# Patient Record
Sex: Female | Born: 1950
Health system: Southern US, Community
[De-identification: ages and names within clinical notes are randomized; demographics above are authoritative.]

## PROBLEM LIST (undated history)

## (undated) DIAGNOSIS — J189 Pneumonia, unspecified organism: Secondary | ICD-10-CM

## (undated) DIAGNOSIS — H409 Unspecified glaucoma: Secondary | ICD-10-CM

## (undated) DIAGNOSIS — M1990 Unspecified osteoarthritis, unspecified site: Secondary | ICD-10-CM

## (undated) DIAGNOSIS — E079 Disorder of thyroid, unspecified: Secondary | ICD-10-CM

## (undated) DIAGNOSIS — I10 Essential (primary) hypertension: Secondary | ICD-10-CM

## (undated) DIAGNOSIS — G709 Myoneural disorder, unspecified: Secondary | ICD-10-CM

## (undated) DIAGNOSIS — E669 Obesity, unspecified: Secondary | ICD-10-CM

## (undated) DIAGNOSIS — I82409 Acute embolism and thrombosis of unspecified deep veins of unspecified lower extremity: Secondary | ICD-10-CM

## (undated) HISTORY — PX: ABDOMINAL HYSTERECTOMY: SHX81

## (undated) HISTORY — DX: Pneumonia, unspecified organism: J18.9

## (undated) HISTORY — DX: Disorder of thyroid, unspecified: E07.9

## (undated) HISTORY — PX: OTHER SURGICAL HISTORY: SHX169

## (undated) HISTORY — PX: EYE SURGERY: SHX253

---

## 2000-08-16 ENCOUNTER — Encounter: Admission: RE | Admit: 2000-08-16 | Discharge: 2000-08-16 | Payer: Self-pay | Admitting: *Deleted

## 2000-08-16 ENCOUNTER — Encounter: Payer: Self-pay | Admitting: *Deleted

## 2004-03-14 ENCOUNTER — Emergency Department (HOSPITAL_COMMUNITY): Admission: EM | Admit: 2004-03-14 | Discharge: 2004-03-14 | Payer: Self-pay | Admitting: Emergency Medicine

## 2004-04-11 ENCOUNTER — Ambulatory Visit (HOSPITAL_COMMUNITY): Admission: RE | Admit: 2004-04-11 | Discharge: 2004-04-11 | Payer: Self-pay | Admitting: Internal Medicine

## 2005-04-17 ENCOUNTER — Ambulatory Visit (HOSPITAL_COMMUNITY): Admission: RE | Admit: 2005-04-17 | Discharge: 2005-04-17 | Payer: Self-pay | Admitting: Internal Medicine

## 2006-05-14 ENCOUNTER — Ambulatory Visit (HOSPITAL_COMMUNITY): Admission: RE | Admit: 2006-05-14 | Discharge: 2006-05-14 | Payer: Self-pay | Admitting: Obstetrics & Gynecology

## 2006-07-22 ENCOUNTER — Ambulatory Visit: Payer: Self-pay | Admitting: Gastroenterology

## 2006-08-05 ENCOUNTER — Ambulatory Visit: Payer: Self-pay | Admitting: Gastroenterology

## 2007-09-25 ENCOUNTER — Ambulatory Visit (HOSPITAL_COMMUNITY): Admission: RE | Admit: 2007-09-25 | Discharge: 2007-09-25 | Payer: Self-pay | Admitting: Obstetrics & Gynecology

## 2008-09-28 ENCOUNTER — Ambulatory Visit (HOSPITAL_COMMUNITY): Admission: RE | Admit: 2008-09-28 | Discharge: 2008-09-28 | Payer: Self-pay | Admitting: Obstetrics & Gynecology

## 2009-10-06 ENCOUNTER — Ambulatory Visit (HOSPITAL_COMMUNITY): Admission: RE | Admit: 2009-10-06 | Discharge: 2009-10-06 | Payer: Self-pay | Admitting: Obstetrics & Gynecology

## 2010-11-07 ENCOUNTER — Ambulatory Visit (HOSPITAL_COMMUNITY)
Admission: RE | Admit: 2010-11-07 | Discharge: 2010-11-07 | Payer: Self-pay | Source: Home / Self Care | Attending: Obstetrics & Gynecology | Admitting: Obstetrics & Gynecology

## 2010-11-09 ENCOUNTER — Ambulatory Visit (HOSPITAL_COMMUNITY)
Admission: RE | Admit: 2010-11-09 | Discharge: 2010-11-09 | Payer: Self-pay | Source: Home / Self Care | Attending: Obstetrics & Gynecology | Admitting: Obstetrics & Gynecology

## 2011-10-26 ENCOUNTER — Other Ambulatory Visit: Payer: Self-pay | Admitting: Obstetrics & Gynecology

## 2011-10-26 DIAGNOSIS — Z1231 Encounter for screening mammogram for malignant neoplasm of breast: Secondary | ICD-10-CM

## 2011-11-29 ENCOUNTER — Ambulatory Visit (HOSPITAL_COMMUNITY): Payer: Self-pay

## 2011-11-29 ENCOUNTER — Ambulatory Visit (HOSPITAL_COMMUNITY)
Admission: RE | Admit: 2011-11-29 | Discharge: 2011-11-29 | Disposition: A | Discharge status: Home / Self Care | Payer: Self-pay | Source: Ambulatory Visit | Attending: Obstetrics & Gynecology | Admitting: Obstetrics & Gynecology

## 2011-11-29 DIAGNOSIS — Z1231 Encounter for screening mammogram for malignant neoplasm of breast: Secondary | ICD-10-CM

## 2012-11-13 ENCOUNTER — Other Ambulatory Visit: Payer: Self-pay | Admitting: Obstetrics & Gynecology

## 2012-11-13 DIAGNOSIS — Z1231 Encounter for screening mammogram for malignant neoplasm of breast: Secondary | ICD-10-CM

## 2012-12-09 ENCOUNTER — Ambulatory Visit (HOSPITAL_COMMUNITY)
Admission: RE | Admit: 2012-12-09 | Discharge: 2012-12-09 | Disposition: A | Discharge status: Home / Self Care | Payer: Self-pay | Source: Ambulatory Visit | Attending: Obstetrics & Gynecology | Admitting: Obstetrics & Gynecology

## 2012-12-09 DIAGNOSIS — Z1231 Encounter for screening mammogram for malignant neoplasm of breast: Secondary | ICD-10-CM

## 2013-07-10 ENCOUNTER — Emergency Department (INDEPENDENT_AMBULATORY_CARE_PROVIDER_SITE_OTHER): Payer: BC Managed Care – PPO

## 2013-07-10 ENCOUNTER — Emergency Department (HOSPITAL_COMMUNITY)
Admission: EM | Admit: 2013-07-10 | Discharge: 2013-07-10 | Disposition: A | Discharge status: Home / Self Care | Payer: BC Managed Care – PPO | Source: Home / Self Care

## 2013-07-10 ENCOUNTER — Encounter (HOSPITAL_COMMUNITY): Payer: Self-pay | Admitting: Emergency Medicine

## 2013-07-10 DIAGNOSIS — J189 Pneumonia, unspecified organism: Secondary | ICD-10-CM

## 2013-07-10 HISTORY — DX: Unspecified glaucoma: H40.9

## 2013-07-10 MED ORDER — LEVOFLOXACIN 500 MG PO TABS: 500.0000 mg | ORAL_TABLET | Freq: Every day | ORAL | Status: DC

## 2013-07-10 MED ORDER — ALBUTEROL SULFATE (5 MG/ML) 0.5% IN NEBU
5.0000 mg | INHALATION_SOLUTION | Freq: Once | RESPIRATORY_TRACT | Status: AC
  Administered 2013-07-10: 5 mg via RESPIRATORY_TRACT

## 2013-07-10 MED ORDER — ALBUTEROL SULFATE (5 MG/ML) 0.5% IN NEBU
INHALATION_SOLUTION | RESPIRATORY_TRACT | Status: AC
  Filled 2013-07-10: qty 1

## 2013-07-10 NOTE — ED Notes (Signed)
Returned to room from xray

## 2013-07-10 NOTE — ED Notes (Signed)
Reports head and chest congestion, reports feeling like head in a vice.  Denies ear pain and denies sore throat.  Unknown if patient has fever

## 2013-07-10 NOTE — ED Provider Notes (Signed)
Ariel Henry is a 62 y.o. female who presents to Urgent Care today for congestion and wheezing for the last 4 days. Associated with a mildly productive cough. Patient has tried NyQuil which is only helped a bit. She denies any fevers or chills chest pains or palpitations. She denies any nausea vomiting diarrhea or sore throat.   PMH reviewed. Hypertension History  Substance Use Topics  . Smoking status: Never Smoker   . Smokeless tobacco: Not on file  . Alcohol Use: No   ROS as above Medications reviewed. No current facility-administered medications for this encounter.   Current Outpatient Prescriptions  Medication Sig Dispense Refill  . DiphenhydrAMINE HCl (ALKA-SELTZER PLUS ALLERGY PO) Take by mouth.      . GuaiFENesin (MUCINEX PO) Take by mouth.      Marland Kitchen OVER THE COUNTER MEDICATION Eye drops      . Pseudoeph-Doxylamine-DM-APAP (NYQUIL PO) Take by mouth.      . levofloxacin (LEVAQUIN) 500 MG tablet Take 1 tablet (500 mg total) by mouth daily.  10 tablet  0    Exam:  BP 169/91  Pulse 76  Temp(Src) 97.9 F (36.6 C) (Oral)  Resp 18  SpO2 100% Gen: Well NAD HEENT: EOMI,  MMM, erythematous posterior pharynx. Normal tympanic membranes bilaterally Lungs: CTABL expiratory wheezing bilaterally. No rales or crackles Heart: RRR no MRG Abd: NABS, NT, ND Exts: Non edematous BL  LE, warm and well perfused.   No results found for this or any previous visit (from the past 24 hour(s)). Dg Chest 2 View  07/10/2013   *RADIOLOGY REPORT*  Clinical Data: Cough and congestion.  CHEST - 2 VIEW  Comparison: None.  Findings: Heart size is normal.  The aorta is unfolded.  There is patchy infiltrate at both lung bases consistent with mild pneumonia.  The upper lungs are clear except for a small granuloma in the left upper lobe.  No effusions.  Bony structures are unremarkable.  IMPRESSION: Mild, patchy pulmonary infiltrates at the lung bases consistent with mild pneumonia.   Original Report  Authenticated By: Paulina Fusi, M.D.    Assessment and Plan: 62 y.o. female with pneumonia.  Plan to treat empirically with Levaquin.  Followup primary care provider if not improving Handout provided Discussed warning signs or symptoms. Please see discharge instructions. Patient expresses understanding.      Rodolph Bong, MD 07/10/13 [phone]

## 2013-07-11 ENCOUNTER — Telehealth (HOSPITAL_COMMUNITY): Payer: Self-pay | Admitting: Family Medicine

## 2013-07-11 NOTE — ED Notes (Signed)
Patient wants to know if she can take Alkaseltzer plus with her antibiotic.  I attend to call patient x 2 and it was busy

## 2013-07-12 ENCOUNTER — Emergency Department (HOSPITAL_COMMUNITY): Payer: BC Managed Care – PPO

## 2013-07-12 ENCOUNTER — Encounter (HOSPITAL_COMMUNITY): Payer: Self-pay

## 2013-07-12 ENCOUNTER — Emergency Department (HOSPITAL_COMMUNITY)
Admission: EM | Admit: 2013-07-12 | Discharge: 2013-07-12 | Disposition: A | Discharge status: Home / Self Care | Payer: BC Managed Care – PPO | Attending: Emergency Medicine | Admitting: Emergency Medicine

## 2013-07-12 DIAGNOSIS — J069 Acute upper respiratory infection, unspecified: Secondary | ICD-10-CM

## 2013-07-12 DIAGNOSIS — J3489 Other specified disorders of nose and nasal sinuses: Secondary | ICD-10-CM | POA: Insufficient documentation

## 2013-07-12 DIAGNOSIS — R6883 Chills (without fever): Secondary | ICD-10-CM | POA: Insufficient documentation

## 2013-07-12 DIAGNOSIS — Z7982 Long term (current) use of aspirin: Secondary | ICD-10-CM | POA: Insufficient documentation

## 2013-07-12 DIAGNOSIS — Z8669 Personal history of other diseases of the nervous system and sense organs: Secondary | ICD-10-CM | POA: Insufficient documentation

## 2013-07-12 DIAGNOSIS — J189 Pneumonia, unspecified organism: Secondary | ICD-10-CM

## 2013-07-12 DIAGNOSIS — J159 Unspecified bacterial pneumonia: Secondary | ICD-10-CM | POA: Insufficient documentation

## 2013-07-12 DIAGNOSIS — Z79899 Other long term (current) drug therapy: Secondary | ICD-10-CM | POA: Insufficient documentation

## 2013-07-12 MED ORDER — DM-GUAIFENESIN ER 30-600 MG PO TB12: 1.0000 | ORAL_TABLET | Freq: Two times a day (BID) | ORAL | Status: DC

## 2013-07-12 MED ORDER — ALBUTEROL SULFATE HFA 108 (90 BASE) MCG/ACT IN AERS: 1.0000 | INHALATION_SPRAY | Freq: Four times a day (QID) | RESPIRATORY_TRACT | Status: DC | PRN

## 2013-07-12 NOTE — ED Provider Notes (Signed)
CSN: [id number]     Arrival date & time 07/12/13  4098 History     First MD Initiated Contact with Patient 07/12/13 [phone]     Chief Complaint  Patient presents with  . Shortness of Breath   (Consider location/radiation/quality/duration/timing/severity/associated sxs/prior Treatment) Patient is a 62 y.o. female presenting with shortness of breath. The history is provided by the patient and a friend.  Shortness of Breath Associated symptoms: no chest pain, no fever, no headaches and no rash    patient seen in urgent care on August 15 chest x-ray done diagnosed with pneumonia started on Levaquin. Patient with persistent shortness of breath and her main complaint is significant head congestion. Patient's symptoms started with chills one week ago started with the congestion 6 days ago was seen in urgent care on Friday as stated above. Patient has tried over-the-counter cold and flu medicines without any relief of the nasal congestion.  Past Medical History  Diagnosis Date  . Glaucoma    Past Surgical History  Procedure Laterality Date  . Abdominal hysterectomy     No family history on file. History  Substance Use Topics  . Smoking status: Never Smoker   . Smokeless tobacco: Not on file  . Alcohol Use: No   OB History   Grav Para Term Preterm Abortions TAB SAB Ect Mult Living                 Review of Systems  Constitutional: Negative for fever.  HENT: Positive for congestion and sinus pressure.   Eyes: Negative for redness.  Respiratory: Positive for shortness of breath.   Cardiovascular: Negative for chest pain.  Genitourinary: Negative for dysuria.  Musculoskeletal: Negative for back pain.  Skin: Negative for rash.  Neurological: Negative for headaches.  Hematological: Does not bruise/bleed easily.  Psychiatric/Behavioral: Negative for confusion.    Allergies  Review of patient's allergies indicates no known allergies.  Home Medications   Current Outpatient Rx   Name  Route  Sig  Dispense  Refill  . aspirin EC 81 MG tablet   Oral   Take 81 mg by mouth daily.         Marland Kitchen levofloxacin (LEVAQUIN) 500 MG tablet   Oral   Take 1 tablet (500 mg total) by mouth daily.   10 tablet   0   . pyridOXINE (VITAMIN B-6) 100 MG tablet   Oral   Take 100 mg by mouth daily.         Marland Kitchen albuterol (PROVENTIL HFA;VENTOLIN HFA) 108 (90 BASE) MCG/ACT inhaler   Inhalation   Inhale 1-2 puffs into the lungs every 6 (six) hours as needed for wheezing.   1 Inhaler   0   . dextromethorphan-guaiFENesin (MUCINEX DM) 30-600 MG per 12 hr tablet   Oral   Take 1 tablet by mouth every 12 (twelve) hours.   14 tablet   0    BP 136/58  Pulse 58  Temp(Src) 98.6 F (37 C) (Oral)  Resp 27  SpO2 97% Physical Exam  Nursing note and vitals reviewed. Constitutional: She is oriented to person, place, and time. She appears well-developed and well-nourished. No distress.  HENT:  Head: Normocephalic and atraumatic.  Mouth/Throat: Oropharynx is clear and moist.  Eyes: Conjunctivae and EOM are normal. Pupils are equal, round, and reactive to light.  Neck: Normal range of motion. Neck supple.  Cardiovascular: Normal rate, regular rhythm and normal heart sounds.   No murmur heard. Pulmonary/Chest: Effort normal and breath sounds  normal. No respiratory distress. She has no wheezes. She has no rales.  Abdominal: Soft. Bowel sounds are normal. There is no tenderness.  Musculoskeletal: Normal range of motion. She exhibits no edema.  Neurological: She is alert and oriented to person, place, and time. No cranial nerve deficit. She exhibits normal muscle tone. Coordination normal.  Skin: Skin is warm.    ED Course   Procedures (including critical care time)  Labs Reviewed - No data to display Dg Chest 2 View  07/12/2013   *RADIOLOGY REPORT*  Clinical Data: Cough, shortness of breath  CHEST - 2 VIEW  Comparison: 07/10/2013  Findings: Cardiomediastinal silhouette is stable.   Elevation of the right hemidiaphragm again noted.  No pulmonary edema.  Persistent streaky residual infiltrate left base retrocardiac.  The bony thorax is stable.  IMPRESSION: No significant change.  Persistent streaky residual infiltrate left base retrocardiac.   Original Report Authenticated By: Natasha Mead, M.D.   Dg Chest 2 View  07/10/2013   *RADIOLOGY REPORT*  Clinical Data: Cough and congestion.  CHEST - 2 VIEW  Comparison: None.  Findings: Heart size is normal.  The aorta is unfolded.  There is patchy infiltrate at both lung bases consistent with mild pneumonia.  The upper lungs are clear except for a small granuloma in the left upper lobe.  No effusions.  Bony structures are unremarkable.  IMPRESSION: Mild, patchy pulmonary infiltrates at the lung bases consistent with mild pneumonia.   Original Report Authenticated By: Paulina Fusi, M.D.   1. CAP (community acquired pneumonia)   2. URI (upper respiratory infection)     MDM  Patient diagnosed with community-acquired pneumonia based on chest x-ray from August 15. Today's x-ray without significant change. Patient with complaint of worsening shortness of breath room air pulse ox is 95% plus. Patient not tachypnea. No wheezing. Patient will be treated with albuterol inhaler and Mucinex DM for the upper respiratory nasal congestion. Patient will continue her Levaquin. Patient nontoxic no acute distress.  Shelda Jakes, MD 07/12/13 [phone]

## 2013-07-12 NOTE — ED Notes (Signed)
Pt having Shob, pt was diagnosed with PNA to days ago.  Pt states that she took Levaquin Fri and Sat but cant get mucous up.

## 2013-10-05 ENCOUNTER — Emergency Department (INDEPENDENT_AMBULATORY_CARE_PROVIDER_SITE_OTHER): Payer: BC Managed Care – PPO

## 2013-10-05 ENCOUNTER — Encounter (HOSPITAL_COMMUNITY): Payer: Self-pay | Admitting: Emergency Medicine

## 2013-10-05 ENCOUNTER — Emergency Department (INDEPENDENT_AMBULATORY_CARE_PROVIDER_SITE_OTHER)
Admission: EM | Admit: 2013-10-05 | Discharge: 2013-10-05 | Disposition: A | Discharge status: Home / Self Care | Payer: BC Managed Care – PPO | Source: Home / Self Care | Attending: Family Medicine | Admitting: Family Medicine

## 2013-10-05 DIAGNOSIS — J069 Acute upper respiratory infection, unspecified: Secondary | ICD-10-CM

## 2013-10-05 MED ORDER — IPRATROPIUM BROMIDE 0.06 % NA SOLN: 2.0000 | Freq: Four times a day (QID) | NASAL | Status: DC

## 2013-10-05 MED ORDER — HYDROCOD POLST-CHLORPHEN POLST 10-8 MG/5ML PO LQCR: 5.0000 mL | Freq: Two times a day (BID) | ORAL | Status: DC | PRN

## 2013-10-05 NOTE — ED Provider Notes (Signed)
CSN: [id number]     Arrival date & time 10/05/13  1645 History   First MD Initiated Contact with Patient 10/05/13 1840     Chief Complaint  Patient presents with  . Cough   (Consider location/radiation/quality/duration/timing/severity/associated sxs/prior Treatment) Patient is a 62 y.o. female presenting with cough. The history is provided by the patient.  Cough Cough characteristics:  Productive Sputum characteristics:  Yellow Severity:  Mild Onset quality:  Gradual Duration:  2 weeks Timing:  Sporadic Chronicity:  New Smoker: no   Relieved by:  None tried Worsened by:  Nothing tried Ineffective treatments:  Cough suppressants Associated symptoms: chills   Associated symptoms: no chest pain, no fever, no rash, no shortness of breath, no sore throat and no wheezing     Past Medical History  Diagnosis Date  . Glaucoma    Past Surgical History  Procedure Laterality Date  . Abdominal hysterectomy     History reviewed. No pertinent family history. History  Substance Use Topics  . Smoking status: Never Smoker   . Smokeless tobacco: Not on file  . Alcohol Use: No   OB History   Grav Para Term Preterm Abortions TAB SAB Ect Mult Living                 Review of Systems  Constitutional: Positive for chills. Negative for fever.  HENT: Negative.  Negative for sore throat.   Respiratory: Positive for cough. Negative for shortness of breath and wheezing.   Cardiovascular: Negative.  Negative for chest pain.  Gastrointestinal: Negative.   Skin: Negative for rash.    Allergies  Review of patient's allergies indicates no known allergies.  Home Medications   Current Outpatient Rx  Name  Route  Sig  Dispense  Refill  . albuterol (PROVENTIL HFA;VENTOLIN HFA) 108 (90 BASE) MCG/ACT inhaler   Inhalation   Inhale 1-2 puffs into the lungs every 6 (six) hours as needed for wheezing.   1 Inhaler   0   . aspirin EC 81 MG tablet   Oral   Take 81 mg by mouth daily.          . chlorpheniramine-HYDROcodone (TUSSIONEX PENNKINETIC ER) 10-8 MG/5ML LQCR   Oral   Take 5 mLs by mouth every 12 (twelve) hours as needed for cough.   115 mL   0   . dextromethorphan-guaiFENesin (MUCINEX DM) 30-600 MG per 12 hr tablet   Oral   Take 1 tablet by mouth every 12 (twelve) hours.   14 tablet   0   . ipratropium (ATROVENT) 0.06 % nasal spray   Nasal   Place 2 sprays into the nose 4 (four) times daily.   15 mL   1   . levofloxacin (LEVAQUIN) 500 MG tablet   Oral   Take 1 tablet (500 mg total) by mouth daily.   10 tablet   0   . pyridOXINE (VITAMIN B-6) 100 MG tablet   Oral   Take 100 mg by mouth daily.          BP 145/82  Pulse 70  Temp(Src) 98 F (36.7 C) (Oral)  Resp 18  SpO2 100% Physical Exam  Nursing note and vitals reviewed. Constitutional: She is oriented to person, place, and time. She appears well-developed and well-nourished.  HENT:  Head: Normocephalic.  Right Ear: External ear normal.  Left Ear: External ear normal.  Mouth/Throat: Oropharynx is clear and moist.  Eyes: Conjunctivae are normal. Pupils are equal, round, and reactive to light.  Neck: Normal range of motion. Neck supple.  Cardiovascular: Normal rate, regular rhythm, normal heart sounds and intact distal pulses.   Pulmonary/Chest: Effort normal and breath sounds normal. No respiratory distress. She has no wheezes. She has no rales.  Lymphadenopathy:    She has no cervical adenopathy.  Neurological: She is alert and oriented to person, place, and time.  Skin: Skin is warm and dry.    ED Course  Procedures (including critical care time) Labs Review Labs Reviewed - No data to display Imaging Review Dg Chest 2 View  10/05/2013   CLINICAL DATA:  Cough.  EXAM: CHEST  2 VIEW  COMPARISON:  07/12/2013  FINDINGS: The heart size and mediastinal contours are within normal limits. Both lungs are clear. The visualized skeletal structures are unremarkable.  IMPRESSION: No active  cardiopulmonary disease.   Electronically Signed   By: Charlett Nose M.D.   On: 10/05/2013 19:26    EKG Interpretation     Ventricular Rate:    PR Interval:    QRS Duration:   QT Interval:    QTC Calculation:   R Axis:     Text Interpretation:              MDM  X-rays reviewed and report per radiologist.     Linna Hoff, MD 10/05/13 1932

## 2013-10-05 NOTE — ED Notes (Addendum)
C/o cough for two weeks now States she does have chills and states that the cough is productive with yellowish thick mucous OTC medications tried but no relief.

## 2013-12-15 ENCOUNTER — Other Ambulatory Visit: Payer: Self-pay | Admitting: Obstetrics & Gynecology

## 2013-12-15 DIAGNOSIS — Z1231 Encounter for screening mammogram for malignant neoplasm of breast: Secondary | ICD-10-CM

## 2013-12-29 ENCOUNTER — Ambulatory Visit (HOSPITAL_COMMUNITY): Payer: BC Managed Care – PPO

## 2014-01-05 ENCOUNTER — Ambulatory Visit (HOSPITAL_COMMUNITY)
Admission: RE | Admit: 2014-01-05 | Discharge: 2014-01-05 | Disposition: A | Discharge status: Home / Self Care | Payer: No Typology Code available for payment source | Source: Ambulatory Visit | Attending: Obstetrics & Gynecology | Admitting: Obstetrics & Gynecology

## 2014-01-05 DIAGNOSIS — Z1231 Encounter for screening mammogram for malignant neoplasm of breast: Secondary | ICD-10-CM | POA: Insufficient documentation

## 2014-01-19 ENCOUNTER — Ambulatory Visit (HOSPITAL_COMMUNITY): Payer: BC Managed Care – PPO

## 2014-11-22 ENCOUNTER — Encounter: Payer: Self-pay | Admitting: *Deleted

## 2014-11-30 ENCOUNTER — Other Ambulatory Visit (HOSPITAL_COMMUNITY): Payer: Self-pay | Admitting: Physician Assistant

## 2014-11-30 DIAGNOSIS — Z1231 Encounter for screening mammogram for malignant neoplasm of breast: Secondary | ICD-10-CM

## 2015-01-06 ENCOUNTER — Ambulatory Visit (HOSPITAL_COMMUNITY)
Admission: RE | Admit: 2015-01-06 | Discharge: 2015-01-06 | Disposition: A | Discharge status: Home / Self Care | Payer: 59 | Source: Ambulatory Visit | Attending: Physician Assistant | Admitting: Physician Assistant

## 2015-01-06 DIAGNOSIS — Z1231 Encounter for screening mammogram for malignant neoplasm of breast: Secondary | ICD-10-CM | POA: Diagnosis not present

## 2015-12-13 ENCOUNTER — Other Ambulatory Visit: Payer: Self-pay

## 2015-12-13 DIAGNOSIS — Z1231 Encounter for screening mammogram for malignant neoplasm of breast: Secondary | ICD-10-CM

## 2016-01-09 ENCOUNTER — Ambulatory Visit: Payer: No Typology Code available for payment source

## 2016-01-10 ENCOUNTER — Ambulatory Visit
Admission: RE | Admit: 2016-01-10 | Discharge: 2016-01-10 | Disposition: A | Discharge status: Home / Self Care | Payer: BLUE CROSS/BLUE SHIELD | Source: Ambulatory Visit

## 2016-01-10 DIAGNOSIS — Z1231 Encounter for screening mammogram for malignant neoplasm of breast: Secondary | ICD-10-CM

## 2016-02-09 ENCOUNTER — Other Ambulatory Visit: Payer: Self-pay | Admitting: Physician Assistant

## 2016-02-09 DIAGNOSIS — IMO0002 Reserved for concepts with insufficient information to code with codable children: Secondary | ICD-10-CM

## 2016-02-09 DIAGNOSIS — R229 Localized swelling, mass and lump, unspecified: Principal | ICD-10-CM

## 2016-02-16 ENCOUNTER — Ambulatory Visit
Admission: RE | Admit: 2016-02-16 | Discharge: 2016-02-16 | Disposition: A | Discharge status: Home / Self Care | Payer: BLUE CROSS/BLUE SHIELD | Source: Ambulatory Visit | Attending: Physician Assistant | Admitting: Physician Assistant

## 2016-02-16 DIAGNOSIS — R229 Localized swelling, mass and lump, unspecified: Principal | ICD-10-CM

## 2016-02-16 DIAGNOSIS — IMO0002 Reserved for concepts with insufficient information to code with codable children: Secondary | ICD-10-CM

## 2016-02-16 MED ORDER — IOPAMIDOL (ISOVUE-300) INJECTION 61%
75.0000 mL | Freq: Once | INTRAVENOUS | Status: AC | PRN
  Administered 2016-02-16: 75 mL via INTRAVENOUS

## 2016-06-18 ENCOUNTER — Encounter: Payer: Self-pay | Admitting: Gastroenterology

## 2016-06-22 ENCOUNTER — Encounter: Payer: Self-pay | Admitting: Gastroenterology

## 2016-07-27 ENCOUNTER — Other Ambulatory Visit: Payer: Self-pay | Admitting: Internal Medicine

## 2016-07-27 DIAGNOSIS — R5381 Other malaise: Secondary | ICD-10-CM

## 2016-07-27 DIAGNOSIS — E049 Nontoxic goiter, unspecified: Secondary | ICD-10-CM

## 2016-08-08 ENCOUNTER — Ambulatory Visit (AMBULATORY_SURGERY_CENTER): Payer: Self-pay

## 2016-08-08 ENCOUNTER — Telehealth: Payer: Self-pay

## 2016-08-08 VITALS — Ht 60.0 in | Wt 265.0 lb

## 2016-08-08 DIAGNOSIS — Z1211 Encounter for screening for malignant neoplasm of colon: Secondary | ICD-10-CM

## 2016-08-08 MED ORDER — SUPREP BOWEL PREP KIT 17.5-3.13-1.6 GM/177ML PO SOLN
1.0000 | Freq: Once | ORAL | 0 refills | Status: AC
Start: 2016-08-08 — End: 2016-08-08

## 2016-08-08 NOTE — Telephone Encounter (Signed)
BMI TOO HIGH.  DOES PT NEED OV OR DIRECT [facility] PT?  HAD PV TODAY.  PLEASE CALL PT [phone] AT HOME IN THE EVENING, AFTER 4PM.  THANKS, Fischer Halley/PV

## 2016-08-08 NOTE — Telephone Encounter (Signed)
Patient contacted. She needs a Wednesday if possible.

## 2016-08-08 NOTE — Telephone Encounter (Signed)
Ok to schedule as direct

## 2016-08-08 NOTE — Progress Notes (Signed)
No allergies to eggs or soy No past problems with anesthesia No diet meds No home oxygen  No internet 

## 2016-08-09 ENCOUNTER — Other Ambulatory Visit: Payer: Self-pay

## 2016-08-09 DIAGNOSIS — Z1211 Encounter for screening for malignant neoplasm of colon: Secondary | ICD-10-CM

## 2016-08-09 NOTE — Telephone Encounter (Signed)
No Wednesday hospital days in October. Scheduled for 09/04/16 at 11:15 am arrive 9:45 am.  Patient needs a call with this information after 4pm.

## 2016-08-09 NOTE — Telephone Encounter (Signed)
Patient accepts the date of 09/04/16 at [facility]. Instructions mailed to her.

## 2016-08-10 ENCOUNTER — Ambulatory Visit
Admission: RE | Admit: 2016-08-10 | Discharge: 2016-08-10 | Disposition: A | Discharge status: Home / Self Care | Payer: BLUE CROSS/BLUE SHIELD | Source: Ambulatory Visit | Attending: Internal Medicine | Admitting: Internal Medicine

## 2016-08-10 DIAGNOSIS — E049 Nontoxic goiter, unspecified: Secondary | ICD-10-CM

## 2016-08-22 ENCOUNTER — Encounter: Payer: Self-pay | Admitting: Gastroenterology

## 2016-08-22 ENCOUNTER — Encounter: Payer: BLUE CROSS/BLUE SHIELD | Admitting: Gastroenterology

## 2016-08-23 NOTE — Telephone Encounter (Signed)
ok 

## 2016-08-24 NOTE — Telephone Encounter (Signed)
Appointment was moved to the Hospital. Pt NOT BILLED.

## 2016-09-03 ENCOUNTER — Encounter (HOSPITAL_COMMUNITY): Payer: Self-pay | Admitting: *Deleted

## 2016-09-04 ENCOUNTER — Ambulatory Visit (HOSPITAL_COMMUNITY): Payer: BLUE CROSS/BLUE SHIELD | Admitting: Anesthesiology

## 2016-09-04 ENCOUNTER — Encounter (HOSPITAL_COMMUNITY): Payer: Self-pay | Admitting: Anesthesiology

## 2016-09-04 ENCOUNTER — Encounter (HOSPITAL_COMMUNITY)
Admission: RE | Disposition: A | Discharge status: Home / Self Care | Payer: Self-pay | Source: Ambulatory Visit | Attending: Gastroenterology

## 2016-09-04 ENCOUNTER — Ambulatory Visit (HOSPITAL_COMMUNITY)
Admission: RE | Admit: 2016-09-04 | Discharge: 2016-09-04 | Disposition: A | Discharge status: Home / Self Care | Payer: BLUE CROSS/BLUE SHIELD | Source: Ambulatory Visit | Attending: Gastroenterology | Admitting: Gastroenterology

## 2016-09-04 DIAGNOSIS — Z6841 Body Mass Index (BMI) 40.0 and over, adult: Secondary | ICD-10-CM | POA: Insufficient documentation

## 2016-09-04 DIAGNOSIS — K648 Other hemorrhoids: Secondary | ICD-10-CM | POA: Insufficient documentation

## 2016-09-04 DIAGNOSIS — Z7982 Long term (current) use of aspirin: Secondary | ICD-10-CM | POA: Diagnosis not present

## 2016-09-04 DIAGNOSIS — Z1211 Encounter for screening for malignant neoplasm of colon: Secondary | ICD-10-CM | POA: Diagnosis present

## 2016-09-04 DIAGNOSIS — K6389 Other specified diseases of intestine: Secondary | ICD-10-CM | POA: Insufficient documentation

## 2016-09-04 DIAGNOSIS — M1990 Unspecified osteoarthritis, unspecified site: Secondary | ICD-10-CM | POA: Insufficient documentation

## 2016-09-04 DIAGNOSIS — K5730 Diverticulosis of large intestine without perforation or abscess without bleeding: Secondary | ICD-10-CM | POA: Insufficient documentation

## 2016-09-04 DIAGNOSIS — Z1212 Encounter for screening for malignant neoplasm of rectum: Secondary | ICD-10-CM

## 2016-09-04 DIAGNOSIS — D175 Benign lipomatous neoplasm of intra-abdominal organs: Secondary | ICD-10-CM

## 2016-09-04 HISTORY — DX: Unspecified osteoarthritis, unspecified site: M19.90

## 2016-09-04 HISTORY — PX: COLONOSCOPY WITH PROPOFOL: SHX5780

## 2016-09-04 HISTORY — DX: Myoneural disorder, unspecified: G70.9

## 2016-09-04 SURGERY — COLONOSCOPY WITH PROPOFOL
Anesthesia: Monitor Anesthesia Care

## 2016-09-04 MED ORDER — PROPOFOL 10 MG/ML IV BOLUS
INTRAVENOUS | Status: AC
  Filled 2016-09-04: qty 40

## 2016-09-04 MED ORDER — LACTATED RINGERS IV SOLN
INTRAVENOUS | Status: DC
  Administered 2016-09-04: 11:00:00 via INTRAVENOUS

## 2016-09-04 MED ORDER — PROPOFOL 500 MG/50ML IV EMUL
INTRAVENOUS | Status: DC | PRN
  Administered 2016-09-04: 85 ug/kg/min via INTRAVENOUS

## 2016-09-04 MED ORDER — PROPOFOL 500 MG/50ML IV EMUL
INTRAVENOUS | Status: DC | PRN
Start: 2016-09-04 — End: 2016-09-04
  Administered 2016-09-04: 20 mg via INTRAVENOUS
  Administered 2016-09-04: 40 mg via INTRAVENOUS

## 2016-09-04 SURGICAL SUPPLY — 21 items

## 2016-09-04 NOTE — Transfer of Care (Signed)
Immediate Anesthesia Transfer of Care Note  Patient: Ariel Henry  Procedure(s) Performed: Procedure(s): COLONOSCOPY WITH PROPOFOL (N/A)  Patient Location: PACU  Anesthesia Type:MAC  Level of Consciousness:  sedated, patient cooperative and responds to stimulation  Airway & Oxygen Therapy:Patient Spontanous Breathing and Patient connected to face mask oxgen  Post-op Assessment:  Report given to PACU RN and Post -op Vital signs reviewed and stable  Post vital signs:  Reviewed and stable  Last Vitals:  Vitals:   09/04/16 1002  BP: 128/65  Pulse: 61  Resp: 15  Temp: 123XX123 C    Complications: No apparent anesthesia complications

## 2016-09-04 NOTE — H&P (Signed)
[facility] History and Physical   Primary Care Physician:  Benito Mccreedy, MD   Reason for Procedure:  Screening for colorectal cancer Plan:    Colonoscopy with possible intervention     HPI: Ariel Henry is a 65 y.o. female with morbid obesity is here for screening colonoscopy. Denies any nausea, vomiting, abdominal pain, melena or bright red blood per rectum Last colonoscopy in 2007 was normal.    Past Medical History:  Diagnosis Date  . Arthritis   . Glaucoma   . Neuromuscular disorder (HCC)    carpal tunnel both wrists  . Pneumonia   . Thyroid disease     Past Surgical History:  Procedure Laterality Date  . ABDOMINAL HYSTERECTOMY    . bladder tack    . EYE SURGERY     bilateral cataract extraction    Prior to Admission medications   Medication Sig Start Date End Date Taking? Authorizing Provider  albuterol (PROVENTIL HFA;VENTOLIN HFA) 108 (90 BASE) MCG/ACT inhaler Inhale 1-2 puffs into the lungs every 6 (six) hours as needed for wheezing. 07/12/13  Yes Fredia Sorrow, MD  aspirin EC 81 MG tablet Take 81 mg by mouth daily.   Yes Historical Provider, MD  cholecalciferol (VITAMIN D) 1000 units tablet Take 1,000 Units by mouth daily.   Yes Historical Provider, MD  dorzolamide (TRUSOPT) 2 % ophthalmic solution 1 drop 3 (three) times daily.   Yes Historical Provider, MD  gabapentin (NEURONTIN) 300 MG capsule Take 300 mg by mouth at bedtime.   Yes Historical Provider, MD  latanoprost (XALATAN) 0.005 % ophthalmic solution Place 1 drop into both eyes at bedtime.   Yes Historical Provider, MD  Polyethyl Glycol-Propyl Glycol (SYSTANE ULTRA OP) Apply to eye.   Yes Historical Provider, MD  predniSONE (DELTASONE) 10 MG tablet Take 10 mg by mouth daily with breakfast. Started on Thursday 07/31/2016-take 6 pills 1st day, then 5 pills for 2 days, then 4 pills for 2 days, then 4 pills for 2 days, then 3 pills for 2 days, then 2 pills for 2 days then 1 pill for 2 days  and then finished   Yes Historical Provider, MD  PRESCRIPTION MEDICATION ciproflaxacine 0.2% ear gtts for "ringing in my ears"   Yes Historical Provider, MD  pyridOXINE (VITAMIN B-6) 100 MG tablet Take 100 mg by mouth daily.   Yes Historical Provider, MD  timolol (TIMOPTIC) 0.5 % ophthalmic solution 1 drop 2 (two) times daily.   Yes Historical Provider, MD    Current Facility-Administered Medications  Medication Dose Route Frequency Provider Last Rate Last Dose  . lactated ringers infusion   Intravenous Continuous Mauri Pole, MD        Allergies as of 08/09/2016  . (No Known Allergies)    Family History  Problem Relation Age of Onset  . Colon cancer Neg Hx     Social History   Social History  . Marital status: Widowed    Spouse name: N/A  . Number of children: N/A  . Years of education: N/A   Occupational History  . Not on file.   Social History Main Topics  . Smoking status: Never Smoker  . Smokeless tobacco: Never Used  . Alcohol use No  . Drug use: No  . Sexual activity: Not on file   Other Topics Concern  . Not on file   Social History Narrative  . No narrative on file    Review of Systems:  All other review of systems negative except  as mentioned in the HPI.  Physical Exam: Vital signs in last 24 hours: Temp:  [97.8 F (36.6 C)] 97.8 F (36.6 C) (10/10 1002) Pulse Rate:  [61] 61 (10/10 1002) Resp:  [15] 15 (10/10 1002) BP: (128)/(65) 128/65 (10/10 1002) SpO2:  [97 %] 97 % (10/10 1002) Weight:  [265 lb (120.2 kg)] 265 lb (120.2 kg) (10/10 1002)   General:   Alert,  Morbidly obese, pleasant and cooperative in NAD Lungs:  Clear throughout to auscultation.   Heart:  Regular rate and rhythm; no murmurs, clicks, rubs,  or gallops. Abdomen:  Soft, nontender and nondistended. Normal bowel sounds.   Neuro/Psych:  Alert and cooperative. Normal mood and affect. A and O x 3   @K .Denzil Magnuson, MD [phone] Mon-Fri 8a-5p [phone] after 5p,  weekends, holidays 09/04/2016 10:50 AM@

## 2016-09-04 NOTE — Anesthesia Postprocedure Evaluation (Signed)
Anesthesia Post Note  Patient: Ariel Henry  Procedure(s) Performed: Procedure(s) (LRB): COLONOSCOPY WITH PROPOFOL (N/A)  Patient location during evaluation: PACU Anesthesia Type: MAC Level of consciousness: awake and alert Pain management: pain level controlled Vital Signs Assessment: post-procedure vital signs reviewed and stable Respiratory status: spontaneous breathing, nonlabored ventilation, respiratory function stable and patient connected to nasal cannula oxygen Cardiovascular status: stable and blood pressure returned to baseline Anesthetic complications: no    Last Vitals:  Vitals:   09/04/16 1137 09/04/16 1140  BP: 130/61   Pulse: 72 60  Resp: 17   Temp: 36.3 C     Last Pain:  Vitals:   09/04/16 1137  TempSrc: Oral                 Kathelene Rumberger J

## 2016-09-04 NOTE — Op Note (Signed)
[facility] Patient Name: Ariel Henry Procedure Date: 09/04/2016 MRN: [id number] Attending MD: Mauri Pole , MD Date of Birth: 1951-04-07 CSN: [id number] Age: 65 Admit Type: Outpatient Procedure:                Colonoscopy Indications:              Screening for malignant neoplasm in the rectum,                            Last colonoscopy 10 years ago Providers:                Mauri Pole, MD, Elmer Ramp. Hinson, RN, Cherylynn Ridges, Technician, [employer], CRNA Referring MD:              Medicines:                Monitored Anesthesia Care Complications:            No immediate complications. Estimated Blood Loss:     Estimated blood loss: none. Procedure:                Pre-Anesthesia Assessment:                           - Prior to the procedure, a History and Physical                            was performed, and patient medications and                            allergies were reviewed. The patient's tolerance of                            previous anesthesia was also reviewed. The risks                            and benefits of the procedure and the sedation                            options and risks were discussed with the patient.                            All questions were answered, and informed consent                            was obtained. Prior Anticoagulants: The patient has                            taken no previous anticoagulant or antiplatelet                            agents. ASA Grade Assessment: III - A patient with  severe systemic disease. After reviewing the risks                            and benefits, the patient was deemed in                            satisfactory condition to undergo the procedure.                           After obtaining informed consent, the colonoscope                            was passed under direct vision. Throughout the                             procedure, the patient's blood pressure, pulse, and                            oxygen saturations were monitored continuously. The                            EC-[id number] scope was introduced through                            the anus and advanced to the the terminal ileum,                            with identification of the appendiceal orifice and                            IC valve. The colonoscopy was performed without                            difficulty. The patient tolerated the procedure                            well. The quality of the bowel preparation was                            good. The terminal ileum, ileocecal valve,                            appendiceal orifice, and rectum were photographed. Scope In: 11:05:04 AM Scope Out: 11:26:42 AM Scope Withdrawal Time: 0 hours 15 minutes 4 seconds  Total Procedure Duration: 0 hours 21 minutes 38 seconds  Findings:      The perianal and digital rectal examinations were normal.      The ileocecal valve was slightly lipomatous.      There was a small lipoma, 15 mm in diameter, at the ileocecal valve.       This was biopsied with a cold forceps for diagnostic purposes.      Multiple small and large-mouthed diverticula were found in the sigmoid       colon. Peri-diverticular erythema was seen. There was evidence of an  impacted diverticulum. Petechia were visualized in association with the       diverticular opening.      Non-bleeding internal hemorrhoids were found during retroflexion. Impression:               - Lipomatous ileocecal valve.                           - Small lipoma at the ileocecal valve. Biopsied.                           - Moderate diverticulosis in the sigmoid colon.                            Peri-diverticular erythema was seen. There was                            evidence of an impacted diverticulum. Petechia were                            visualized in association with the  diverticular                            opening.                           - Non-bleeding internal hemorrhoids. Moderate Sedation:      N/A- Per Anesthesia Care Recommendation:           - Patient has a contact number available for                            emergencies. The signs and symptoms of potential                            delayed complications were discussed with the                            patient. Return to normal activities tomorrow.                            Written discharge instructions were provided to the                            patient.                           - Resume previous diet.                           - Continue present medications.                           - Await pathology results.                           - Repeat colonoscopy in 10 years for screening  purposes.                           - Return to GI clinic PRN. Procedure Code(s):        --- Professional ---                           [phone], Colonoscopy, flexible; with biopsy, single                            or multiple Diagnosis Code(s):        --- Professional ---                           K63.89, Other specified diseases of intestine                           D17.5, Benign lipomatous neoplasm of                            intra-abdominal organs                           K64.8, Other hemorrhoids                           Z12.12, Encounter for screening for malignant                            neoplasm of rectum                           K57.30, Diverticulosis of large intestine without                            perforation or abscess without bleeding CPT copyright 2016 [employer]. All rights reserved. The codes documented in this report are preliminary and upon coder review may  be revised to meet current compliance requirements. Mauri Pole, MD 09/04/2016 11:35:07 AM This report has been signed electronically. Number of Addenda: 0

## 2016-09-04 NOTE — Anesthesia Preprocedure Evaluation (Addendum)
Anesthesia Evaluation  Patient identified by MRN, date of birth, ID band Patient awake    Reviewed: Allergy & Precautions, NPO status , Patient's Chart, lab work & pertinent test results  Airway Mallampati: III  TM Distance: >3 FB Neck ROM: Full    Dental no notable dental hx.    Pulmonary pneumonia, resolved,    Pulmonary exam normal breath sounds clear to auscultation       Cardiovascular negative cardio ROS Normal cardiovascular exam Rhythm:Regular Rate:Normal     Neuro/Psych  Neuromuscular disease negative psych ROS   GI/Hepatic negative GI ROS, Neg liver ROS,   Endo/Other  Morbid obesity  Renal/GU negative Renal ROS  negative genitourinary   Musculoskeletal  (+) Arthritis ,   Abdominal (+) + obese,   Peds negative pediatric ROS (+)  Hematology negative hematology ROS (+)   Anesthesia Other Findings   Reproductive/Obstetrics negative OB ROS                            Anesthesia Physical Anesthesia Plan  ASA: III  Anesthesia Plan: MAC   Post-op Pain Management:    Induction: Intravenous  Airway Management Planned: Natural Airway  Additional Equipment:   Intra-op Plan:   Post-operative Plan:   Informed Consent: I have reviewed the patients History and Physical, chart, labs and discussed the procedure including the risks, benefits and alternatives for the proposed anesthesia with the patient or authorized representative who has indicated his/her understanding and acceptance.     Plan Discussed with: CRNA  Anesthesia Plan Comments:         Anesthesia Quick Evaluation

## 2016-09-04 NOTE — Discharge Instructions (Signed)

## 2016-09-05 ENCOUNTER — Encounter (HOSPITAL_COMMUNITY): Payer: Self-pay | Admitting: Gastroenterology

## 2016-12-14 ENCOUNTER — Emergency Department (HOSPITAL_COMMUNITY): Payer: BLUE CROSS/BLUE SHIELD

## 2016-12-14 ENCOUNTER — Inpatient Hospital Stay (HOSPITAL_COMMUNITY)
Admission: EM | Admit: 2016-12-14 | Discharge: 2016-12-17 | DRG: 208 | Disposition: A | Discharge status: Home / Self Care | Payer: BLUE CROSS/BLUE SHIELD | Attending: Family Medicine | Admitting: Family Medicine

## 2016-12-14 DIAGNOSIS — G9340 Encephalopathy, unspecified: Secondary | ICD-10-CM | POA: Diagnosis not present

## 2016-12-14 DIAGNOSIS — M545 Low back pain: Secondary | ICD-10-CM | POA: Diagnosis present

## 2016-12-14 DIAGNOSIS — J9601 Acute respiratory failure with hypoxia: Secondary | ICD-10-CM | POA: Diagnosis present

## 2016-12-14 DIAGNOSIS — J690 Pneumonitis due to inhalation of food and vomit: Secondary | ICD-10-CM | POA: Diagnosis not present

## 2016-12-14 DIAGNOSIS — I952 Hypotension due to drugs: Secondary | ICD-10-CM | POA: Diagnosis present

## 2016-12-14 DIAGNOSIS — I517 Cardiomegaly: Secondary | ICD-10-CM | POA: Diagnosis present

## 2016-12-14 DIAGNOSIS — G8929 Other chronic pain: Secondary | ICD-10-CM | POA: Diagnosis present

## 2016-12-14 DIAGNOSIS — G92 Toxic encephalopathy: Secondary | ICD-10-CM | POA: Diagnosis present

## 2016-12-14 DIAGNOSIS — Z6841 Body Mass Index (BMI) 40.0 and over, adult: Secondary | ICD-10-CM | POA: Diagnosis not present

## 2016-12-14 DIAGNOSIS — I10 Essential (primary) hypertension: Secondary | ICD-10-CM | POA: Diagnosis present

## 2016-12-14 DIAGNOSIS — J9602 Acute respiratory failure with hypercapnia: Secondary | ICD-10-CM

## 2016-12-14 DIAGNOSIS — J9600 Acute respiratory failure, unspecified whether with hypoxia or hypercapnia: Secondary | ICD-10-CM | POA: Diagnosis present

## 2016-12-14 DIAGNOSIS — J810 Acute pulmonary edema: Secondary | ICD-10-CM

## 2016-12-14 DIAGNOSIS — R739 Hyperglycemia, unspecified: Secondary | ICD-10-CM | POA: Diagnosis present

## 2016-12-14 DIAGNOSIS — E872 Acidosis: Secondary | ICD-10-CM | POA: Diagnosis present

## 2016-12-14 DIAGNOSIS — J811 Chronic pulmonary edema: Secondary | ICD-10-CM | POA: Diagnosis present

## 2016-12-14 DIAGNOSIS — T4275XA Adverse effect of unspecified antiepileptic and sedative-hypnotic drugs, initial encounter: Secondary | ICD-10-CM | POA: Diagnosis present

## 2016-12-14 DIAGNOSIS — R4182 Altered mental status, unspecified: Secondary | ICD-10-CM

## 2016-12-14 DIAGNOSIS — N179 Acute kidney failure, unspecified: Secondary | ICD-10-CM | POA: Diagnosis present

## 2016-12-14 DIAGNOSIS — E669 Obesity, unspecified: Secondary | ICD-10-CM | POA: Diagnosis not present

## 2016-12-14 LAB — I-STAT ARTERIAL BLOOD GAS, ED
ACID-BASE DEFICIT: 3 mmol/L — AB (ref 0.0–2.0)
BICARBONATE: 25.6 mmol/L (ref 20.0–28.0)
O2 Saturation: 99 %
PCO2 ART: 58.6 mmHg — AB (ref 32.0–48.0)
TCO2: 27 mmol/L (ref 0–100)
pH, Arterial: 7.249 — ABNORMAL LOW (ref 7.350–7.450)
pO2, Arterial: 162 mmHg — ABNORMAL HIGH (ref 83.0–108.0)

## 2016-12-14 LAB — PROTIME-INR
INR: 1.05
Prothrombin Time: 13.7 seconds (ref 11.4–15.2)

## 2016-12-14 LAB — LACTIC ACID, PLASMA
LACTIC ACID, VENOUS: 2.5 mmol/L — AB (ref 0.5–1.9)
Lactic Acid, Venous: 2.1 mmol/L (ref 0.5–1.9)

## 2016-12-14 LAB — COMPREHENSIVE METABOLIC PANEL
ALT: 13 U/L — AB (ref 14–54)
AST: 28 U/L (ref 15–41)
Albumin: 4 g/dL (ref 3.5–5.0)
Alkaline Phosphatase: 80 U/L (ref 38–126)
Anion gap: 13 (ref 5–15)
BILIRUBIN TOTAL: 0.5 mg/dL (ref 0.3–1.2)
BUN: 16 mg/dL (ref 6–20)
CHLORIDE: 106 mmol/L (ref 101–111)
CO2: 22 mmol/L (ref 22–32)
CREATININE: 1.27 mg/dL — AB (ref 0.44–1.00)
Calcium: 9 mg/dL (ref 8.9–10.3)
GFR, EST AFRICAN AMERICAN: 54 mL/min — AB (ref >60)
GFR, EST NON AFRICAN AMERICAN: 47 mL/min — AB (ref >60)
Glucose, Bld: 244 mg/dL — ABNORMAL HIGH (ref 65–99)
POTASSIUM: 3.7 mmol/L (ref 3.5–5.1)
Sodium: 141 mmol/L (ref 135–145)
TOTAL PROTEIN: 7.3 g/dL (ref 6.5–8.1)

## 2016-12-14 LAB — URINALYSIS, ROUTINE W REFLEX MICROSCOPIC
BILIRUBIN URINE: NEGATIVE
Glucose, UA: NEGATIVE mg/dL
HGB URINE DIPSTICK: NEGATIVE
Ketones, ur: NEGATIVE mg/dL
Leukocytes, UA: NEGATIVE
NITRITE: NEGATIVE
PH: 5 (ref 5.0–8.0)
Protein, ur: NEGATIVE mg/dL
SPECIFIC GRAVITY, URINE: 1.012 (ref 1.005–1.030)

## 2016-12-14 LAB — RAPID URINE DRUG SCREEN, HOSP PERFORMED
Amphetamines: NOT DETECTED
BARBITURATES: NOT DETECTED
Benzodiazepines: NOT DETECTED
Cocaine: NOT DETECTED
Opiates: NOT DETECTED
TETRAHYDROCANNABINOL: NOT DETECTED

## 2016-12-14 LAB — CBC WITH DIFFERENTIAL/PLATELET
Basophils Absolute: 0.1 10*3/uL (ref 0.0–0.1)
Basophils Relative: 0 %
EOS PCT: 3 %
Eosinophils Absolute: 0.3 10*3/uL (ref 0.0–0.7)
HEMATOCRIT: 45.7 % (ref 36.0–46.0)
Hemoglobin: 13.9 g/dL (ref 12.0–15.0)
LYMPHS ABS: 4.7 10*3/uL — AB (ref 0.7–4.0)
Lymphocytes Relative: 41 %
MCH: 26.1 pg (ref 26.0–34.0)
MCHC: 30.4 g/dL (ref 30.0–36.0)
MCV: 85.7 fL (ref 78.0–100.0)
MONO ABS: 0.5 10*3/uL (ref 0.1–1.0)
Monocytes Relative: 5 %
NEUTROS ABS: 5.9 10*3/uL (ref 1.7–7.7)
Neutrophils Relative %: 51 %
PLATELETS: 334 10*3/uL (ref 150–400)
RBC: 5.33 MIL/uL — ABNORMAL HIGH (ref 3.87–5.11)
RDW: 15 % (ref 11.5–15.5)
WBC: 11.5 10*3/uL — ABNORMAL HIGH (ref 4.0–10.5)

## 2016-12-14 LAB — GLUCOSE, CAPILLARY
GLUCOSE-CAPILLARY: 94 mg/dL (ref 65–99)
Glucose-Capillary: 98 mg/dL (ref 65–99)

## 2016-12-14 LAB — I-STAT CG4 LACTIC ACID, ED: Lactic Acid, Venous: 5.77 mmol/L (ref 0.5–1.9)

## 2016-12-14 LAB — TROPONIN I
TROPONIN I: 0.31 ng/mL — AB (ref <0.03)
Troponin I: 0.78 ng/mL (ref <0.03)

## 2016-12-14 LAB — TRIGLYCERIDES: TRIGLYCERIDES: 106 mg/dL (ref <150)

## 2016-12-14 LAB — I-STAT TROPONIN, ED: TROPONIN I, POC: 0.01 ng/mL (ref 0.00–0.08)

## 2016-12-14 LAB — BRAIN NATRIURETIC PEPTIDE
B NATRIURETIC PEPTIDE 5: 16.4 pg/mL (ref 0.0–100.0)
B Natriuretic Peptide: 25.4 pg/mL (ref 0.0–100.0)

## 2016-12-14 LAB — PROCALCITONIN: Procalcitonin: <0.1 ng/mL

## 2016-12-14 LAB — D-DIMER, QUANTITATIVE (NOT AT ARMC): D DIMER QUANT: 2.75 ug{FEU}/mL — AB (ref 0.00–0.50)

## 2016-12-14 LAB — CBG MONITORING, ED: Glucose-Capillary: 109 mg/dL — ABNORMAL HIGH (ref 65–99)

## 2016-12-14 MED ORDER — DEXTROSE 5 % IV SOLN: 500.0000 mg | Freq: Once | INTRAVENOUS | Status: DC

## 2016-12-14 MED ORDER — INSULIN ASPART 100 UNIT/ML ~~LOC~~ SOLN
2.0000 [IU] | SUBCUTANEOUS | Status: DC
  Administered 2016-12-16: 2 [IU] via SUBCUTANEOUS

## 2016-12-14 MED ORDER — ETOMIDATE 2 MG/ML IV SOLN
INTRAVENOUS | Status: AC | PRN
  Administered 2016-12-14: 30 mg via INTRAVENOUS

## 2016-12-14 MED ORDER — FENTANYL CITRATE (PF) 100 MCG/2ML IJ SOLN: 100.0000 ug | INTRAMUSCULAR | Status: DC | PRN

## 2016-12-14 MED ORDER — POTASSIUM CHLORIDE 20 MEQ/15ML (10%) PO SOLN: 40.0000 meq | Freq: Three times a day (TID) | ORAL | Status: AC

## 2016-12-14 MED ORDER — ORAL CARE MOUTH RINSE
15.0000 mL | Freq: Four times a day (QID) | OROMUCOSAL | Status: DC
  Administered 2016-12-15 – 2016-12-16 (×5): 15 mL via OROMUCOSAL

## 2016-12-14 MED ORDER — CHLORHEXIDINE GLUCONATE 0.12% ORAL RINSE (MEDLINE KIT)
15.0000 mL | Freq: Two times a day (BID) | OROMUCOSAL | Status: DC
  Administered 2016-12-15 – 2016-12-16 (×5): 15 mL via OROMUCOSAL

## 2016-12-14 MED ORDER — PROPOFOL 1000 MG/100ML IV EMUL
5.0000 ug/kg/min | INTRAVENOUS | Status: DC
  Administered 2016-12-14: 60 ug/kg/min via INTRAVENOUS
  Administered 2016-12-15: 20 ug/kg/min via INTRAVENOUS
  Filled 2016-12-14 (×2): qty 100

## 2016-12-14 MED ORDER — ROCURONIUM BROMIDE 50 MG/5ML IV SOLN
INTRAVENOUS | Status: AC | PRN
  Administered 2016-12-14: 100 mg via INTRAVENOUS

## 2016-12-14 MED ORDER — SODIUM CHLORIDE 0.9 % IV SOLN
3.0000 g | Freq: Three times a day (TID) | INTRAVENOUS | Status: DC
  Administered 2016-12-14 – 2016-12-16 (×5): 3 g via INTRAVENOUS
  Filled 2016-12-14 (×6): qty 3

## 2016-12-14 MED ORDER — DEXTROSE 5 % IV SOLN: 1.0000 g | Freq: Once | INTRAVENOUS | Status: DC

## 2016-12-14 MED ORDER — HEPARIN SODIUM (PORCINE) 5000 UNIT/ML IJ SOLN
5000.0000 [IU] | Freq: Three times a day (TID) | INTRAMUSCULAR | Status: DC
  Administered 2016-12-14 – 2016-12-17 (×9): 5000 [IU] via SUBCUTANEOUS
  Filled 2016-12-14 (×9): qty 1

## 2016-12-14 MED ORDER — DEXTROSE 5 % IV SOLN
1.0000 g | INTRAVENOUS | Status: DC
  Filled 2016-12-14: qty 10

## 2016-12-14 MED ORDER — DEXTROSE 5 % IV SOLN
500.0000 mg | INTRAVENOUS | Status: DC
  Filled 2016-12-14: qty 500

## 2016-12-14 MED ORDER — PROPOFOL 1000 MG/100ML IV EMUL
5.0000 ug/kg/min | INTRAVENOUS | Status: DC
  Administered 2016-12-14 (×2): 5 ug/kg/min via INTRAVENOUS

## 2016-12-14 MED ORDER — SODIUM CHLORIDE 0.9 % IV SOLN: 250.0000 mL | INTRAVENOUS | Status: DC | PRN

## 2016-12-14 MED ORDER — FENTANYL CITRATE (PF) 100 MCG/2ML IJ SOLN
100.0000 ug | INTRAMUSCULAR | Status: DC | PRN
  Administered 2016-12-14: 100 ug via INTRAVENOUS
  Filled 2016-12-14: qty 2

## 2016-12-14 MED ORDER — FUROSEMIDE 10 MG/ML IJ SOLN
40.0000 mg | Freq: Four times a day (QID) | INTRAMUSCULAR | Status: AC
  Administered 2016-12-14 – 2016-12-15 (×3): 40 mg via INTRAVENOUS
  Filled 2016-12-14 (×3): qty 4

## 2016-12-14 MED ORDER — PANTOPRAZOLE SODIUM 40 MG IV SOLR
40.0000 mg | Freq: Every day | INTRAVENOUS | Status: DC
  Administered 2016-12-14: 40 mg via INTRAVENOUS
  Filled 2016-12-14: qty 40

## 2016-12-14 MED ORDER — PROPOFOL 1000 MG/100ML IV EMUL
INTRAVENOUS | Status: AC
  Filled 2016-12-14: qty 100

## 2016-12-14 NOTE — Progress Notes (Signed)
CRITICAL VALUE ALERT  Critical value received:  Lactic Acid 2.5   Date of notification:  12/14/16  Time of notification:  2105  Critical value read back:Yes.    Nurse who received alert:  Mickle Mallory  MD notified (1st page):  Dr. Reesa Chew    Time of first page:  2110  Time MD responded:  2110

## 2016-12-14 NOTE — ED Notes (Signed)
RN and RT transported patient to CT and back.

## 2016-12-14 NOTE — Progress Notes (Signed)
Responded to staff call to support staff and family .  Patient arrived unresponsive and was intubated.  Patient three sons and grandson were escorted to consultation room and briefed by EDP.  Patient preparing for X-Chavon Lucarelli and will be admitted.  Provided empathetic l;istening, hospitality and presence.  Chaplain available as needed.  Jaclynn Major, [place] - [phone]

## 2016-12-14 NOTE — ED Notes (Signed)
Elevated CG-4 of 5.77 reported to Dr. Sherry Ruffing

## 2016-12-14 NOTE — H&P (Signed)
PULMONARY / CRITICAL CARE MEDICINE   Name: Ariel Henry MRN: [id number] DOB: 09/17/1959    ADMISSION DATE:  12/14/2016 CONSULTATION DATE:  1/19  REFERRING MD:  Dr. Sherry Ruffing (EDP)   CHIEF COMPLAINT:  Dyspnea/Aspiration   HISTORY OF PRESENT ILLNESS:   66 year old with unknown past medical history presents to ED on 1/19 after being found by family member holding her neck (indicating that she was choking), slumped over, and drooling from the mouth with a piece of hard-candy wrapper in her hand. When EMS arrived patient was unresponsive with oxygen saturation 55-60%. EMS placed patient on BIPAP. Upon arrival to ED patient was intubated. PCCM was called to admit.   PAST MEDICAL HISTORY :  She  has no past medical history on file.  PAST SURGICAL HISTORY: She  has no past surgical history on file.  Allergies not on file  No current facility-administered medications on file prior to encounter.    No current outpatient prescriptions on file prior to encounter.    FAMILY HISTORY:  Her has no family status information on file.    SOCIAL HISTORY: She    REVIEW OF SYSTEMS:   Family members indicate that patient has not been recently sick. All other information unable to be obtained as patient is unresponsive.   SUBJECTIVE:  On propofol gtt.   VITAL SIGNS: BP (!) 142/102   Pulse 109   Temp 97.3 F (36.3 C) (Oral)   Resp 20   Wt 104.3 kg (230 lb)   SpO2 (!) 84%   HEMODYNAMICS:    VENTILATOR SETTINGS: Vent Mode: PRVC FiO2 (%):  [100 %] 100 % Set Rate:  [16 bmp] 16 bmp Vt Set:  [500 mL] 500 mL PEEP:  [8 cmH20] 8 cmH20 Plateau Pressure:  [30 cmH20] 30 cmH20  INTAKE / OUTPUT: No intake/output data recorded.  PHYSICAL EXAMINATION: General: Adult female, lying in bed, no distress Neuro:  Sedated, pupils 101mm brisk, moves all extremities, not following commands  HEENT:  ETT in place  Cardiovascular:  Tachy, no MRG, NI S1/S2 Lungs: rhonchi throughout right lobe,  diminished at bases    Abdomen: obese, active bowel sounds  Musculoskeletal:  No deformities  Skin:  Warm, dry, intact   LABS:  BMET  Recent Labs Lab 12/14/16 1443  NA 141  K 3.7  CL 106  CO2 22  BUN 16  CREATININE 1.27*  GLUCOSE 244*    Electrolytes  Recent Labs Lab 12/14/16 1443  CALCIUM 9.0    CBC  Recent Labs Lab 12/14/16 1443  WBC 11.5*  HGB 13.9  HCT 45.7  PLT 334    Coag's  Recent Labs Lab 12/14/16 1443  INR 1.05    Sepsis Markers  Recent Labs Lab 12/14/16 1519  LATICACIDVEN 5.77*    ABG  Recent Labs Lab 12/14/16 1558  PHART 7.249*  PCO2ART 58.6*  PO2ART 162.0*    Liver Enzymes  Recent Labs Lab 12/14/16 1443  AST 28  ALT 13*  ALKPHOS 80  BILITOT 0.5  ALBUMIN 4.0    Cardiac Enzymes No results for input(s): TROPONINI, PROBNP in the last 168 hours.  Glucose No results for input(s): GLUCAP in the last 168 hours.  Imaging Ct Head Wo Contrast  Result Date: 12/14/2016 CLINICAL DATA:  Unresponsive, low O2 sats.  Intubated. EXAM: CT HEAD WITHOUT CONTRAST TECHNIQUE: Contiguous axial images were obtained from the base of the skull through the vertex without intravenous contrast. COMPARISON:  None. FINDINGS: Brain: Ventricles are normal in size  and configuration. All areas of the brain demonstrate grossly normal gray-white matter attenuation (vague low-density areas within the cerebellum are felt to be artifactual caused by beam hardening artifact). There is no mass, hemorrhage, edema or other evidence of acute parenchymal abnormality. No extra-axial hemorrhage. Vascular: There are chronic calcified atherosclerotic changes of the large vessels at the skull base. No unexpected hyperdense vessel. Skull: Normal. Negative for fracture or focal lesion. Sinuses/Orbits: No acute finding. Other: None. IMPRESSION: Negative head CT. No intracranial mass, hemorrhage or edema appreciated. Electronically Signed   By: Franki Cabot M.D.   On:  12/14/2016 15:47   Dg Chest Portable 1 View  Result Date: 12/14/2016 CLINICAL DATA:  Intubated EXAM: PORTABLE CHEST 1 VIEW COMPARISON:  None. FINDINGS: Low lung volumes. Endotracheal tube tip is 3.6 cm above the carina. Top-normal heart size. Grossly normal mediastinal contour. No pneumothorax. No pleural effusion. There are hazy opacities in the mid to lower lungs bilaterally. IMPRESSION: 1. Well-positioned endotracheal tube. 2. Low lung volumes. Hazy opacities in the mid to lower lungs bilaterally, favor atelectasis, cannot exclude a component of aspiration or pneumonia. Electronically Signed   By: Ilona Sorrel M.D.   On: 12/14/2016 15:11     STUDIES:  CT Head 1/19 > Neg acute  CXR 1/19 > low lung volumes, hazy opacities to mid and lower lungs  CULTURES: Blood 1/19 >> Urine 1/19 >> Sputum 1/19 >>   ANTIBIOTICS: Unasyn 1/19 >>   SIGNIFICANT EVENTS: 1/19 > Presents to ED after possible aspiration event   LINES/TUBES: ETT 1/19 >>   DISCUSSION: 66 year old female with unknown patient medical history presents from home after possible aspiration event.   ASSESSMENT / PLAN:  PULMONARY A: Acute Respiratory Distress secondary to possible aspiration event  P:   Vent support  Wean as tolerated  F/U ABG F/U CXR in am  Bronch   CARDIOVASCULAR A:  Hypotension secondary to sedation  Pulmonary edema P:  Cardiac Monitoring  Wean sedation as tolerated  2D echo BNP EKG  RENAL A:   AKI  ?CKD  P:   Trend BMP Replace electrolytes   GASTROINTESTINAL A:   No issues  P:   Start TF 1/20 if unable to extubate   HEMATOLOGIC A:   No issues  P:  Trend CBC   INFECTIOUS A:   Aspiration PNA  P:   PAN culture  Trend Fever and WBC curve Unasyn  Trend Lactic Acid and Procal   ENDOCRINE A:   Hyperglycemia   P: SSI Q4H glucose checks   NEUROLOGIC A:   Encephalopathy secondary to sedation  P:   Wean propofol gtt to achieve RASS  PRN fentanyl  RASS goal:  0/-1    FAMILY  - Updates: Family updated at bedside 1/19   - Inter-disciplinary family meet or Palliative Care meeting due by:  1/26   Hayden Pedro, AG-ACNP [facility]  Pgr: [phone]  PCCM Pgr: [phone]  Attending Note:  66 year old morbidly obese female with no known PMH who presents to the hospital with acute respiratory failure.  Patient appeared to be chocking with a wrapper of hard candy next to her at home and was placed on CPAP by EMS and brought to the ED.  In the ED was unresponsive and was intubated and PCCM asked to admit.  Upon evaluation, patient is stable on vent.  ABG now.  CXR that I reviewed myself showed ETT in good position but pulmonary edema and cardiomegaly  noted.  On exam, diffuse crackles in all lung fields.  Children are not aware of any medical history but BS is elevated and cardiomegaly noted.    Will adjust vent for ABG.  Treat with augmentin for aspiration.  Pan culture.  Bronch for ?of foreign object (revealed no foreign objects just a large amount of frothy secretions noted unsure if negative pressure pulmonary edema or heart failure related.  Check 2D echo and BNP.  ISS and check HgA1C.  Lasix 40 mg IV q6 x3 doses and KCl given.  Children informed that there are a lot of medical questions that need to be addressed and compliance will need to be monitored.  The patient is critically ill with multiple organ systems failure and requires high complexity decision making for assessment and support, frequent evaluation and titration of therapies, application of advanced monitoring technologies and extensive interpretation of multiple databases.   Critical Care Time devoted to patient care services described in this note is  45  Minutes. This time reflects time of care of this signee Dr Jennet Maduro. This critical care time does not reflect procedure time, or teaching time or supervisory time of PA/NP/Med student/Med Resident etc but  could involve care discussion time.  Rush Farmer, M.D. [facility] Pulmonary/Critical Care Medicine. Pager: [phone]. After hours pager: [phone].

## 2016-12-14 NOTE — ED Notes (Addendum)
CC MD at bedside to perform bronchoscopy to see if they can locate the FB that she originally choked on. RT at bedside. Patient awake and able to communicate via nods.

## 2016-12-14 NOTE — ED Provider Notes (Signed)
[facility] DEPT Provider Note   CSN: [id number] Arrival date & time: 12/14/16  1429     History   Chief Complaint Chief Complaint  Patient presents with  . Altered Mental Status  . Shortness of Breath    HPI Ariel Henry is a 66 y.o. female     The history is provided by the EMS personnel and a relative. The history is limited by the condition of the patient. Language interpreter used: patient is unresponsive.  Shortness of Breath  This is a new problem. The average episode lasts 1 hour. The problem occurs continuously.The problem has been rapidly worsening. Pertinent negatives include no fever, no rhinorrhea, no cough, no sputum production, no wheezing and no chest pain. She has tried nothing for the symptoms. Associated medical issues comments: family denies patient having medical problems.    No past medical history on file.  There are no active problems to display for this patient.   No past surgical history on file.  OB History    No data available       Home Medications    Prior to Admission medications   Not on File    Family History No family history on file.  Social History Social History  Substance Use Topics  . Smoking status: Not on file  . Smokeless tobacco: Not on file  . Alcohol use Not on file     Allergies   Patient has no allergy information on record.   Review of Systems Review of Systems  Unable to perform ROS: Patient unresponsive  Constitutional: Negative for fever.  HENT: Negative for rhinorrhea.   Respiratory: Positive for shortness of breath. Negative for cough, sputum production and wheezing.   Cardiovascular: Negative for chest pain.     Physical Exam Updated Vital Signs BP 113/90   Pulse 83   Temp 99.2 F (37.3 C) (Oral)   Resp 20   Wt 230 lb (104.3 kg)   SpO2 100%   Physical Exam  Constitutional: She appears well-developed and well-nourished. She appears distressed.  HENT:  Head: Normocephalic and  atraumatic.  Mouth/Throat: Oropharynx is clear and moist. No oropharyngeal exudate.  Eyes: Conjunctivae are normal. Pupils are equal, round, and reactive to light.  Neck: Neck supple.  Cardiovascular: Normal rate and regular rhythm.   No murmur heard. Pulmonary/Chest: No stridor. Tachypnea noted. She is in respiratory distress. She has decreased breath sounds. She has wheezes. She has no rhonchi. She has rales. She exhibits no tenderness.  Abdominal: Soft. There is no tenderness.  Musculoskeletal: She exhibits no edema.  Neurological: She is unresponsive. GCS eye subscore is 2. GCS verbal subscore is 1. GCS motor subscore is 1.  GCS 4  Skin: Skin is warm. Capillary refill takes less than 2 seconds. No rash noted. She is diaphoretic.  Psychiatric: She has a normal mood and affect.  Nursing note and vitals reviewed.    ED Treatments / Results  Labs (all labs ordered are listed, but only abnormal results are displayed) Labs Reviewed  CBC WITH DIFFERENTIAL/PLATELET - Abnormal; Notable for the following:       Result Value   WBC 11.5 (*)    RBC 5.33 (*)    Lymphs Abs 4.7 (*)    All other components within normal limits  COMPREHENSIVE METABOLIC PANEL - Abnormal; Notable for the following:    Glucose, Bld 244 (*)    Creatinine, Ser 1.27 (*)    ALT 13 (*)    GFR calc non  Af Amer 47 (*)    GFR calc Af Amer 54 (*)    All other components within normal limits  D-DIMER, QUANTITATIVE (NOT AT [facility]) - Abnormal; Notable for the following:    D-Dimer, Quant 2.75 (*)    All other components within normal limits  URINALYSIS, ROUTINE W REFLEX MICROSCOPIC - Abnormal; Notable for the following:    Color, Urine STRAW (*)    All other components within normal limits  LACTIC ACID, PLASMA - Abnormal; Notable for the following:    Lactic Acid, Venous 2.1 (*)    All other components within normal limits  LACTIC ACID, PLASMA - Abnormal; Notable for the following:    Lactic Acid, Venous 2.5 (*)    All  other components within normal limits  TROPONIN I - Abnormal; Notable for the following:    Troponin I 0.31 (*)    All other components within normal limits  TROPONIN I - Abnormal; Notable for the following:    Troponin I 0.78 (*)    All other components within normal limits  TROPONIN I - Abnormal; Notable for the following:    Troponin I 0.89 (*)    All other components within normal limits  HEMOGLOBIN A1C - Abnormal; Notable for the following:    Hgb A1c MFr Bld 5.8 (*)    All other components within normal limits  PHOSPHORUS - Abnormal; Notable for the following:    Phosphorus 2.3 (*)    All other components within normal limits  GLUCOSE, CAPILLARY - Abnormal; Notable for the following:    Glucose-Capillary 64 (*)    All other components within normal limits  GLUCOSE, CAPILLARY - Abnormal; Notable for the following:    Glucose-Capillary 101 (*)    All other components within normal limits  GLUCOSE, CAPILLARY - Abnormal; Notable for the following:    Glucose-Capillary 100 (*)    All other components within normal limits  GLUCOSE, CAPILLARY - Abnormal; Notable for the following:    Glucose-Capillary 151 (*)    All other components within normal limits  I-STAT CG4 LACTIC ACID, ED - Abnormal; Notable for the following:    Lactic Acid, Venous 5.77 (*)    All other components within normal limits  I-STAT ARTERIAL BLOOD GAS, ED - Abnormal; Notable for the following:    pH, Arterial 7.249 (*)    pCO2 arterial 58.6 (*)    pO2, Arterial 162.0 (*)    Acid-base deficit 3.0 (*)    All other components within normal limits  CBG MONITORING, ED - Abnormal; Notable for the following:    Glucose-Capillary 109 (*)    All other components within normal limits  POCT I-STAT 3, ART BLOOD GAS (G3+) - Abnormal; Notable for the following:    pH, Arterial 7.509 (*)    pO2, Arterial 134.0 (*)    Acid-Base Excess 4.0 (*)    All other components within normal limits  CULTURE, BLOOD (ROUTINE X 2)    CULTURE, BLOOD (ROUTINE X 2)  MRSA PCR SCREENING  URINE CULTURE  CULTURE, RESPIRATORY (NON-EXPECTORATED)  CULTURE, BAL-QUANTITATIVE  GRAM STAIN  PROTIME-INR  BRAIN NATRIURETIC PEPTIDE  TRIGLYCERIDES  PROCALCITONIN  RAPID URINE DRUG SCREEN, HOSP PERFORMED  PROCALCITONIN  BASIC METABOLIC PANEL  CBC  MAGNESIUM  BRAIN NATRIURETIC PEPTIDE  GLUCOSE, CAPILLARY  GLUCOSE, CAPILLARY  GLUCOSE, CAPILLARY  LACTIC ACID, PLASMA  BASIC METABOLIC PANEL  MAGNESIUM  PHOSPHORUS  CBC WITH DIFFERENTIAL/PLATELET  TROPONIN I  I-STAT TROPOININ, ED  CYTOLOGY - NON PAP  EKG  EKG Interpretation  Date/Time:  Friday December 14 2016 14:36:07 EST Ventricular Rate:  86 PR Interval:    QRS Duration: 91 QT Interval:  304 QTC Calculation: 366 R Axis:   18 Text Interpretation:  Sinus rhythm Nonspecific repol abnormality, diffuse leads Artifact in lead(s) V5 No prior for comparison.  Q wave in lead III and T wave inversions in leads II, III, and AVF.  No STEMI Confirmed by [facility] MD, CHRISTOPHER [phone]) on 12/14/2016 9:14:32 PM       Radiology Ct Head Wo Contrast  Result Date: 12/14/2016 CLINICAL DATA:  Unresponsive, low O2 sats.  Intubated. EXAM: CT HEAD WITHOUT CONTRAST TECHNIQUE: Contiguous axial images were obtained from the base of the skull through the vertex without intravenous contrast. COMPARISON:  None. FINDINGS: Brain: Ventricles are normal in size and configuration. All areas of the brain demonstrate grossly normal gray-white matter attenuation (vague low-density areas within the cerebellum are felt to be artifactual caused by beam hardening artifact). There is no mass, hemorrhage, edema or other evidence of acute parenchymal abnormality. No extra-axial hemorrhage. Vascular: There are chronic calcified atherosclerotic changes of the large vessels at the skull base. No unexpected hyperdense vessel. Skull: Normal. Negative for fracture or focal lesion. Sinuses/Orbits: No acute finding. Other:  None. IMPRESSION: Negative head CT. No intracranial mass, hemorrhage or edema appreciated. Electronically Signed   By: Franki Cabot M.D.   On: 12/14/2016 15:47   Dg Chest Port 1 View  Result Date: 12/15/2016 CLINICAL DATA:  Ventilation dependent EXAM: PORTABLE CHEST 1 VIEW COMPARISON:  December 14, 2016 FINDINGS: Stable ET tube in good position. No pneumothorax. Low lung volumes. The opacity in the right base has improved in the interval, likely representing atelectasis on the previous study. No focal infiltrates seen today. No acute abnormality. IMPRESSION: No acute abnormality.  Stable ET tube. Electronically Signed   By: Dorise Bullion III M.D   On: 12/15/2016 07:12   Dg Chest Portable 1 View  Result Date: 12/14/2016 CLINICAL DATA:  Intubated EXAM: PORTABLE CHEST 1 VIEW COMPARISON:  None. FINDINGS: Low lung volumes. Endotracheal tube tip is 3.6 cm above the carina. Top-normal heart size. Grossly normal mediastinal contour. No pneumothorax. No pleural effusion. There are hazy opacities in the mid to lower lungs bilaterally. IMPRESSION: 1. Well-positioned endotracheal tube. 2. Low lung volumes. Hazy opacities in the mid to lower lungs bilaterally, favor atelectasis, cannot exclude a component of aspiration or pneumonia. Electronically Signed   By: Ilona Sorrel M.D.   On: 12/14/2016 15:11    Procedures Procedure Name: Intubation Date/Time: 12/14/2016 9:11 PM Performed by: Courtney Paris Pre-anesthesia Checklist: Emergency Drugs available, Suction available, Timeout performed and Patient being monitored Oxygen Delivery Method: Non-rebreather mask Preoxygenation: Pre-oxygenation with 100% oxygen Intubation Type: Rapid sequence Ventilation: Mask ventilation without difficulty Laryngoscope Size: Glidescope Grade View: Grade I Tube size: 7.5 mm Number of attempts: 1 Airway Equipment and Method: Video-laryngoscopy Placement Confirmation: ETT inserted through vocal cords under direct  vision,  Positive ETCO2 and Breath sounds checked- equal and bilateral Secured at: 20 cm Tube secured with: ETT holder Dental Injury: Teeth and Oropharynx as per pre-operative assessment       (including critical care time)  CRITICAL CARE Performed by: Gwenyth Allegra Tegeler Total critical care time: 45 minutes Critical care time was exclusive of separately billable procedures and treating other patients. Critical care was necessary to treat or prevent imminent or life-threatening deterioration. Critical care was time spent personally by me on the following  activities: development of treatment plan with patient and/or surrogate as well as nursing, discussions with consultants, evaluation of patient's response to treatment, examination of patient, obtaining history from patient or surrogate, ordering and performing treatments and interventions, ordering and review of laboratory studies, ordering and review of radiographic studies, pulse oximetry and re-evaluation of patient's condition.   Medications Ordered in ED Medications  etomidate (AMIDATE) injection (30 mg Intravenous Given 12/14/16 1438)  rocuronium (ZEMURON) injection (100 mg Intravenous Given 12/14/16 1439)     Initial Impression / Assessment and Plan / ED Course  I have reviewed the triage vital signs and the nursing notes.  Pertinent labs & imaging results that were available during my care of the patient were reviewed by me and considered in my medical decision making (see chart for details).    Ariel Henry is a 66 y.o. female With no known past medical history Who presents unresponsive and in respiratory distress. Upon arrival, EMS has patient on BiPAP. O2 saturation is in the 50s. After transfer to bed, saturations in the 20s. GCS found to be 4.   Decision made to intubate immediately after evaluation started. Intubation occurred without difficulty.  Family arrived reporting that patient may have choked leading to  unresponsiveness and respiratory failure. No particles were seen during intubation. Family denies preceding fevers, chills, cough, or other symptoms. They report she has no history of congestive heart failure.  After intubation, x-ray appears to show Pulmonary edema and adequate tube placement.  Critical-care quickly called and patient admitted to ICU for further management Of hypoxic respiratory failure.   Final Clinical Impressions(s) / ED Diagnoses   Final diagnoses:  Acute respiratory failure with hypoxia ([place])  Altered mental status, unspecified altered mental status type    New Prescriptions Current Discharge Medication List      Clinical Impression: 1. Acute respiratory failure with hypoxia ([place])   2. Altered mental status, unspecified altered mental status type     Disposition: Admit to [place], MD 12/15/16 2005

## 2016-12-14 NOTE — Procedures (Signed)
Bronchoscopy Procedure Note Ariel Henry [id number] 09/17/1959  Procedure: Bronchoscopy Indications: Diagnostic evaluation of the airways, Obtain specimens for culture and/or other diagnostic studies and Remove secretions  Procedure Details Consent: Risks of procedure as well as the alternatives and risks of each were explained to the (patient/caregiver).  Consent for procedure obtained. Time Out: Verified patient identification, verified procedure, site/side was marked, verified correct patient position, special equipment/implants available, medications/allergies/relevent history reviewed, required imaging and test results available.  Performed  In preparation for procedure, patient was given 100% FiO2 and bronchoscope lubricated. Sedation: Propofol  Airway entered and the following bronchi were examined: RUL, RML, RLL, LUL, LLL and Bronchi.   Procedures performed: Brushings performed Bronchoscope removed.  , Patient placed back on 100% FiO2 at conclusion of procedure.    Evaluation Hemodynamic Status: BP stable throughout; O2 sats: stable throughout Patient's Current Condition: stable Specimens:  Sent serosanguinous fluid Complications: No apparent complications Patient did tolerate procedure well.   Jennet Maduro 12/14/2016

## 2016-12-14 NOTE — Progress Notes (Signed)
The patient is noted to have a lactic acid of 5.77. With the current information available to me, I don't think the patient is in septic shock. This elevated lactic acid, is related to respiratory distress/ respiratory failure in setting of decompensated heart failure.  Plan Will trend Lactic Acid - if this does not improve will adjust plan of care  Continue Unasyn for aspiration coverage  Lasix 40 meq x 3 doses.   Hayden Pedro, AG-ACNP [facility]  Pgr: [phone]  PCCM Pgr: [phone]

## 2016-12-14 NOTE — ED Notes (Signed)
RT at bedside attempting to wean patient off vent.

## 2016-12-14 NOTE — Progress Notes (Signed)
Pharmacy Antibiotic Note  Ariel Henry is a 66 y.o. female admitted on 12/14/2016 with pneumonia.  Pharmacy has been consulted for unasyn dosing.  Plan: Unasyn 3g IV q8h Monitor culture data, renal function and clinical course  Weight: 230 lb (104.3 kg)  Temp (24hrs), Avg:97.3 F (36.3 C), Min:97.3 F (36.3 C), Max:97.3 F (36.3 C)   Recent Labs Lab 12/14/16 1443 12/14/16 1519  WBC 11.5*  --   CREATININE 1.27*  --   LATICACIDVEN  --  5.77*    CrCl cannot be calculated (Unknown ideal weight.).    Allergies not on file   Andrey Cota. Diona Foley, PharmD, [place] Clinical Pharmacist Pager [phone] 12/14/2016 4:33 PM

## 2016-12-14 NOTE — ED Notes (Signed)
Checked CBG 98, RN [place] informed

## 2016-12-14 NOTE — ED Notes (Signed)
Pt complaining of sore throat and choking sensation. Pt awake and alert and requesting her tube be removed. RN called CCMD and he stated to turn her PEEP to 5 and her O2 to 50% and if she maintains her sats she can be titrated down and possibly extuabted

## 2016-12-14 NOTE — ED Triage Notes (Addendum)
Pt reports to the ED via [facility] for eval of SOB. Upon EMS arrival patient was noted to be unresponsive. Pt arrived to ED on CPAP with sats < 55-60%. Per EMS sats never got above 60%. Pt was unresponsive en route as well as on arrival. On arrival she was unresponsive to physical stimuli. EDP made decision to intubate.

## 2016-12-14 NOTE — Progress Notes (Signed)
   12/14/16 1555  Clinical Encounter Type  Visited With Family  Visit Type Follow-up  Spiritual Encounters  Spiritual Needs Emotional  Stress Factors  Patient Stress Factors None identified  Family Stress Factors None identified  Introduction to family. Escorted to Pt's room to visit. Available as needed.

## 2016-12-15 ENCOUNTER — Inpatient Hospital Stay (HOSPITAL_COMMUNITY): Payer: BLUE CROSS/BLUE SHIELD

## 2016-12-15 ENCOUNTER — Inpatient Hospital Stay (HOSPITAL_COMMUNITY): Payer: Self-pay

## 2016-12-15 DIAGNOSIS — J9600 Acute respiratory failure, unspecified whether with hypoxia or hypercapnia: Secondary | ICD-10-CM

## 2016-12-15 LAB — GLUCOSE, CAPILLARY
GLUCOSE-CAPILLARY: 151 mg/dL — AB (ref 65–99)
Glucose-Capillary: 100 mg/dL — ABNORMAL HIGH (ref 65–99)
Glucose-Capillary: 101 mg/dL — ABNORMAL HIGH (ref 65–99)
Glucose-Capillary: 101 mg/dL — ABNORMAL HIGH (ref 65–99)
Glucose-Capillary: 64 mg/dL — ABNORMAL LOW (ref 65–99)
Glucose-Capillary: 90 mg/dL (ref 65–99)

## 2016-12-15 LAB — CBC
HCT: 41.2 % (ref 36.0–46.0)
HEMOGLOBIN: 12.9 g/dL (ref 12.0–15.0)
MCH: 26.1 pg (ref 26.0–34.0)
MCHC: 31.3 g/dL (ref 30.0–36.0)
MCV: 83.2 fL (ref 78.0–100.0)
Platelets: 256 10*3/uL (ref 150–400)
RBC: 4.95 MIL/uL (ref 3.87–5.11)
RDW: 14.8 % (ref 11.5–15.5)
WBC: 8.3 10*3/uL (ref 4.0–10.5)

## 2016-12-15 LAB — POCT I-STAT 3, ART BLOOD GAS (G3+)
Acid-Base Excess: 4 mmol/L — ABNORMAL HIGH (ref 0.0–2.0)
BICARBONATE: 26.6 mmol/L (ref 20.0–28.0)
O2 Saturation: 99 %
PCO2 ART: 33.5 mmHg (ref 32.0–48.0)
Patient temperature: 98.6
TCO2: 28 mmol/L (ref 0–100)
pH, Arterial: 7.509 — ABNORMAL HIGH (ref 7.350–7.450)
pO2, Arterial: 134 mmHg — ABNORMAL HIGH (ref 83.0–108.0)

## 2016-12-15 LAB — ECHOCARDIOGRAM COMPLETE: WEIGHTICAEL: 4088.21 [oz_av]

## 2016-12-15 LAB — MRSA PCR SCREENING: MRSA BY PCR: NEGATIVE

## 2016-12-15 LAB — PROCALCITONIN: Procalcitonin: 0.3 ng/mL

## 2016-12-15 LAB — TROPONIN I: Troponin I: 0.89 ng/mL (ref <0.03)

## 2016-12-15 LAB — BASIC METABOLIC PANEL
ANION GAP: 8 (ref 5–15)
BUN: 15 mg/dL (ref 6–20)
CALCIUM: 9.1 mg/dL (ref 8.9–10.3)
CHLORIDE: 106 mmol/L (ref 101–111)
CO2: 27 mmol/L (ref 22–32)
Creatinine, Ser: 0.79 mg/dL (ref 0.44–1.00)
GFR calc non Af Amer: >60 mL/min (ref >60)
Glucose, Bld: 90 mg/dL (ref 65–99)
Potassium: 3.5 mmol/L (ref 3.5–5.1)
SODIUM: 141 mmol/L (ref 135–145)

## 2016-12-15 LAB — MAGNESIUM: Magnesium: 2 mg/dL (ref 1.7–2.4)

## 2016-12-15 LAB — PHOSPHORUS: Phosphorus: 2.3 mg/dL — ABNORMAL LOW (ref 2.5–4.6)

## 2016-12-15 MED ORDER — PERFLUTREN LIPID MICROSPHERE
1.0000 mL | INTRAVENOUS | Status: AC | PRN
  Administered 2016-12-15: 3 mL via INTRAVENOUS

## 2016-12-15 MED ORDER — DEXTROSE 50 % IV SOLN
INTRAVENOUS | Status: AC
  Administered 2016-12-15: 50 mL
  Filled 2016-12-15: qty 50

## 2016-12-15 MED ORDER — POTASSIUM PHOSPHATES 15 MMOLE/5ML IV SOLN
10.0000 mmol | Freq: Once | INTRAVENOUS | Status: AC
  Administered 2016-12-15: 10 mmol via INTRAVENOUS
  Filled 2016-12-15: qty 3.33

## 2016-12-15 MED ORDER — FUROSEMIDE 10 MG/ML IJ SOLN
40.0000 mg | Freq: Once | INTRAMUSCULAR | Status: AC
  Administered 2016-12-15: 40 mg via INTRAVENOUS
  Filled 2016-12-15: qty 4

## 2016-12-15 NOTE — Progress Notes (Signed)
  Echocardiogram 2D Echocardiogram has been performed.  Ariel Henry M 12/15/2016, 1:22 PM

## 2016-12-15 NOTE — Progress Notes (Signed)
PULMONARY / CRITICAL CARE MEDICINE   Name: Ariel Henry MRN: [id number] DOB: 09/17/1959    ADMISSION DATE:  12/14/2016 CONSULTATION DATE:  1/19  REFERRING MD:  Dr. Sherry Ruffing (EDP)   CHIEF COMPLAINT:  Dyspnea/Aspiration   brief 66 year old with unknown past medical history presents to ED on 1/19 after being found by family member holding her neck (indicating that she was choking), slumped over, and drooling from the mouth with a piece of hard-candy wrapper in her hand. When EMS arrived patient was unresponsive with oxygen saturation 55-60%. EMS placed patient on BIPAP. Upon arrival to ED patient was intubated. PCCM was called to admit.   SUBJECTIVE:  1/20 - On propofol gtt. Meets extubation criteria on SBT  VITAL SIGNS: BP 102/74   Pulse 79   Temp 100.1 F (37.8 C) (Oral)   Resp 20   Wt 115.9 kg (255 lb 8.2 oz)   SpO2 100%   HEMODYNAMICS:    VENTILATOR SETTINGS: Vent Mode: CPAP;PSV FiO2 (%):  [40 %-100 %] 40 % Set Rate:  [16 bmp-18 bmp] 18 bmp Vt Set:  [500 mL] 500 mL PEEP:  [5 cmH20-8 cmH20] 5 cmH20 Pressure Support:  [5 cmH20] 5 cmH20 Plateau Pressure:  [22 cmH20-30 cmH20] 22 cmH20  INTAKE / OUTPUT: I/O last 3 completed shifts: In: 498.8 [I.V.:298.8; IV Piggyback:200] Out: [id number] [Urine:3525]  PHYSICAL EXAMINATION: General: Adult female, lying in bed, no distress Neuro: RASS 0 on low dose diprivan . CAM- ICU neg for delirium. Moves all 4s.  HEENT:  ETT in place  Cardiovascular:  Tachy, no MRG, NI S1/S2 Lungs: diminished at bases   , sync with vnt on PSVT Abdomen: obese, active bowel sounds  Musculoskeletal:  No deformities  Skin:  Warm, dry, intact   LABS:  BMET  Recent Labs Lab 12/14/16 1443 12/15/16 0328  NA 141 141  K 3.7 3.5  CL 106 106  CO2 22 27  BUN 16 15  CREATININE 1.27* 0.79  GLUCOSE 244* 90    Electrolytes  Recent Labs Lab 12/14/16 1443 12/15/16 0328  CALCIUM 9.0 9.1  MG  --  2.0  PHOS  --  2.3*    CBC  Recent Labs Lab  12/14/16 1443 12/15/16 0328  WBC 11.5* 8.3  HGB 13.9 12.9  HCT 45.7 41.2  PLT 334 256    Coag's  Recent Labs Lab 12/14/16 1443  INR 1.05    Sepsis Markers  Recent Labs Lab 12/14/16 1519 12/14/16 1825 12/14/16 2011 12/15/16 0328  LATICACIDVEN 5.77* 2.1* 2.5*  --   PROCALCITON  --  <0.10  --  0.30    ABG  Recent Labs Lab 12/14/16 1558 12/15/16 0416  PHART 7.249* 7.509*  PCO2ART 58.6* 33.5  PO2ART 162.0* 134.0*    Liver Enzymes  Recent Labs Lab 12/14/16 1443  AST 28  ALT 13*  ALKPHOS 80  BILITOT 0.5  ALBUMIN 4.0    Cardiac Enzymes  Recent Labs Lab 12/14/16 1825 12/14/16 2134 12/15/16 0328  TROPONINI 0.31* 0.78* 0.89*    Glucose  Recent Labs Lab 12/14/16 1829 12/14/16 1937 12/14/16 2013 12/15/16 0002 12/15/16 0315 12/15/16 0738  GLUCAP 109* 98 94 64* 90 101*    Imaging Ct Head Wo Contrast  Result Date: 12/14/2016 CLINICAL DATA:  Unresponsive, low O2 sats.  Intubated. EXAM: CT HEAD WITHOUT CONTRAST TECHNIQUE: Contiguous axial images were obtained from the base of the skull through the vertex without intravenous contrast. COMPARISON:  None. FINDINGS: Brain: Ventricles are normal in size and  configuration. All areas of the brain demonstrate grossly normal gray-white matter attenuation (vague low-density areas within the cerebellum are felt to be artifactual caused by beam hardening artifact). There is no mass, hemorrhage, edema or other evidence of acute parenchymal abnormality. No extra-axial hemorrhage. Vascular: There are chronic calcified atherosclerotic changes of the large vessels at the skull base. No unexpected hyperdense vessel. Skull: Normal. Negative for fracture or focal lesion. Sinuses/Orbits: No acute finding. Other: None. IMPRESSION: Negative head CT. No intracranial mass, hemorrhage or edema appreciated. Electronically Signed   By: Franki Cabot M.D.   On: 12/14/2016 15:47   Dg Chest Port 1 View  Result Date:  12/15/2016 CLINICAL DATA:  Ventilation dependent EXAM: PORTABLE CHEST 1 VIEW COMPARISON:  December 14, 2016 FINDINGS: Stable ET tube in good position. No pneumothorax. Low lung volumes. The opacity in the right base has improved in the interval, likely representing atelectasis on the previous study. No focal infiltrates seen today. No acute abnormality. IMPRESSION: No acute abnormality.  Stable ET tube. Electronically Signed   By: Dorise Bullion III M.D   On: 12/15/2016 07:12   Dg Chest Portable 1 View  Result Date: 12/14/2016 CLINICAL DATA:  Intubated EXAM: PORTABLE CHEST 1 VIEW COMPARISON:  None. FINDINGS: Low lung volumes. Endotracheal tube tip is 3.6 cm above the carina. Top-normal heart size. Grossly normal mediastinal contour. No pneumothorax. No pleural effusion. There are hazy opacities in the mid to lower lungs bilaterally. IMPRESSION: 1. Well-positioned endotracheal tube. 2. Low lung volumes. Hazy opacities in the mid to lower lungs bilaterally, favor atelectasis, cannot exclude a component of aspiration or pneumonia. Electronically Signed   By: Ilona Sorrel M.D.   On: 12/14/2016 15:11     STUDIES:  CT Head 1/19 > Neg acute  CXR 1/19 > low lung volumes, hazy opacities to mid and lower lungs  CULTURES: Blood 1/19 >> Urine 1/19 >> Sputum 1/19 >>   ANTIBIOTICS: Unasyn 1/19 >>   SIGNIFICANT EVENTS: 1/19 > Presents to ED after possible aspiration event   LINES/TUBES: ETT 1/19 >>   DISCUSSION: 66 year old female with unknown patient medical history presents from home after possible aspiration event.   ASSESSMENT / PLAN:  PULMONARY A: Acute Respiratory Distress secondary to possible aspiration event     - 12/15/2016 - meets extiubation criteria (s/p bronch yesterday)  P:   Dc diprivan extuibate 12/15/2016   CARDIOVASCULAR A:  Hypotension secondary to sedation  Pulmonary edema popssible at admission Mild trop leak 12/15/2016  But no arrhtymias - sinus  irregularitry   P:  Lasix x 1 aagain 12/15/2016 Cardiac Monitoring    RENAL  Recent Labs Lab 12/14/16 1443 12/15/16 0328  CREATININE 1.27* 0.79     A:   AKI  - resolved Mild low K and low phos   P:   Replete K phos Trend BMP Replace electrolytes   GASTROINTESTINAL A:   No issues  P:   Npo x 4h pos extubation  HEMATOLOGIC A:   No issues  P:  Trend CBC   INFECTIOUS A:   Aspiration PNA  P:   awaut culture Anti-infectives    Start     Dose/Rate Route Frequency Ordered Stop   12/14/16 1700  Ampicillin-Sulbactam (UNASYN) 3 g in sodium chloride 0.9 % 100 mL IVPB     3 g 200 mL/hr over 30 Minutes Intravenous Every 8 hours 12/14/16 1634     12/14/16 1600  azithromycin (ZITHROMAX) 500 mg in dextrose 5 % 250 mL  IVPB  Status:  Discontinued     500 mg 250 mL/hr over 60 Minutes Intravenous Every 24 hours 12/14/16 1519 12/14/16 1626   12/14/16 1600  cefTRIAXone (ROCEPHIN) 1 g in dextrose 5 % 50 mL IVPB  Status:  Discontinued     1 g 100 mL/hr over 30 Minutes Intravenous Every 24 hours 12/14/16 1519 12/14/16 1626   12/14/16 1515  cefTRIAXone (ROCEPHIN) 1 g in dextrose 5 % 50 mL IVPB  Status:  Discontinued     1 g 100 mL/hr over 30 Minutes Intravenous  Once 12/14/16 1514 12/14/16 1519   12/14/16 1515  azithromycin (ZITHROMAX) 500 mg in dextrose 5 % 250 mL IVPB  Status:  Discontinued     500 mg 250 mL/hr over 60 Minutes Intravenous  Once 12/14/16 1514 12/14/16 1519       ENDOCRINE A:   Hyperglycemia   P: SSI Q4H glucose checks   NEUROLOGIC A:   Encephalopathy secondary to sedation    - resolved - wua 12/15/2016  P:   dcopofol gtt to achieve RASS  PRN fentanyl  RASS goal: 0/-1    FAMILY  - Updates: Family updated at bedside 1/19 . Patient updated 12/15/2016   - Inter-disciplinary family meet or Palliative Care meeting due by:  1/26   [place] to tele if well by later in PM1/20/2018    .Dr. Brand Males, M.D., [facility].C.P Pulmonary and  Critical Care Medicine Staff Physician [facility] Pager: [phone], If no answer or between  15:00h - 7:00h: call [phone]  12/15/2016 8:42 AM

## 2016-12-15 NOTE — Procedures (Signed)
Extubation Procedure Note  Patient Details:   Name: Ariel Henry DOB: 09/17/1959 MRN: [id number]   Airway Documentation:     Evaluation  O2 sats: stable throughout Complications: No apparent complications Patient did tolerate procedure well. Bilateral Breath Sounds: Clear, Diminished   Yes   Patient extubated to [place]. Vital signs stable at this time. No complications. Patient tolerating well. RN at bedside. RT will continue to monitor.  Mcneil Sober 12/15/2016, 9:23 AM

## 2016-12-15 NOTE — Progress Notes (Signed)
OG placement and oral potassium held per MD due to possible extubation in the morning. Irregular heart rhythm and elevated troponin and lactic acid addressed. Will continue to monitor patient and labs.

## 2016-12-16 DIAGNOSIS — J9600 Acute respiratory failure, unspecified whether with hypoxia or hypercapnia: Secondary | ICD-10-CM

## 2016-12-16 LAB — CBC WITH DIFFERENTIAL/PLATELET
Basophils Absolute: 0 10*3/uL (ref 0.0–0.1)
Basophils Relative: 1 %
EOS ABS: 0.1 10*3/uL (ref 0.0–0.7)
EOS PCT: 2 %
HCT: 41 % (ref 36.0–46.0)
HEMOGLOBIN: 13.1 g/dL (ref 12.0–15.0)
LYMPHS ABS: 1.9 10*3/uL (ref 0.7–4.0)
Lymphocytes Relative: 25 %
MCH: 26.3 pg (ref 26.0–34.0)
MCHC: 32 g/dL (ref 30.0–36.0)
MCV: 82.3 fL (ref 78.0–100.0)
MONOS PCT: 5 %
Monocytes Absolute: 0.4 10*3/uL (ref 0.1–1.0)
Neutro Abs: 5.1 10*3/uL (ref 1.7–7.7)
Neutrophils Relative %: 67 %
Platelets: 232 10*3/uL (ref 150–400)
RBC: 4.98 MIL/uL (ref 3.87–5.11)
RDW: 14.5 % (ref 11.5–15.5)
WBC: 7.5 10*3/uL (ref 4.0–10.5)

## 2016-12-16 LAB — URINE CULTURE: Culture: NO GROWTH

## 2016-12-16 LAB — BASIC METABOLIC PANEL
Anion gap: 8 (ref 5–15)
BUN: 15 mg/dL (ref 6–20)
CALCIUM: 9.1 mg/dL (ref 8.9–10.3)
CO2: 29 mmol/L (ref 22–32)
CREATININE: 0.61 mg/dL (ref 0.44–1.00)
Chloride: 104 mmol/L (ref 101–111)
GFR calc Af Amer: >60 mL/min (ref >60)
GLUCOSE: 102 mg/dL — AB (ref 65–99)
Potassium: 3.4 mmol/L — ABNORMAL LOW (ref 3.5–5.1)
Sodium: 141 mmol/L (ref 135–145)

## 2016-12-16 LAB — GLUCOSE, CAPILLARY
GLUCOSE-CAPILLARY: 94 mg/dL (ref 65–99)
Glucose-Capillary: 104 mg/dL — ABNORMAL HIGH (ref 65–99)
Glucose-Capillary: 105 mg/dL — ABNORMAL HIGH (ref 65–99)
Glucose-Capillary: 111 mg/dL — ABNORMAL HIGH (ref 65–99)
Glucose-Capillary: 121 mg/dL — ABNORMAL HIGH (ref 65–99)

## 2016-12-16 LAB — LACTIC ACID, PLASMA: LACTIC ACID, VENOUS: 0.9 mmol/L (ref 0.5–1.9)

## 2016-12-16 LAB — MAGNESIUM: Magnesium: 2 mg/dL (ref 1.7–2.4)

## 2016-12-16 LAB — PHOSPHORUS: Phosphorus: 3.8 mg/dL (ref 2.5–4.6)

## 2016-12-16 LAB — TROPONIN I: TROPONIN I: 0.19 ng/mL — AB (ref <0.03)

## 2016-12-16 MED ORDER — TIMOLOL MALEATE 0.5 % OP SOLN
1.0000 [drp] | Freq: Every morning | OPHTHALMIC | Status: DC
  Filled 2016-12-16: qty 5

## 2016-12-16 MED ORDER — AMOXICILLIN-POT CLAVULANATE 875-125 MG PO TABS
1.0000 | ORAL_TABLET | Freq: Two times a day (BID) | ORAL | Status: DC
  Administered 2016-12-16 – 2016-12-17 (×3): 1 via ORAL
  Filled 2016-12-16 (×4): qty 1

## 2016-12-16 MED ORDER — DORZOLAMIDE HCL 2 % OP SOLN
1.0000 [drp] | Freq: Three times a day (TID) | OPHTHALMIC | Status: DC
  Administered 2016-12-17: 1 [drp] via OPHTHALMIC
  Filled 2016-12-16: qty 10

## 2016-12-16 MED ORDER — CYCLOBENZAPRINE HCL 5 MG PO TABS: 5.0000 mg | ORAL_TABLET | Freq: Three times a day (TID) | ORAL | Status: DC | PRN

## 2016-12-16 NOTE — Progress Notes (Signed)
Patient arrived in the unit accompanied by NT via stretcher. Orientation to the unit given. Patient verbalizes understanding. 

## 2016-12-16 NOTE — Progress Notes (Signed)
PROGRESS NOTE    Ariel Henry  [id number] DOB: 09/17/1959 DOA: 12/14/2016 PCP: No primary care provider on file.    Brief Narrative:  66 year old female Past medical history other than hypertension obesity Admitted with choking episode and intubated by emergency room Admitted to critical care service 1/19 placed on ventilator and extubated 1/20   Assessment & Plan:   Active Problems:   Acute respiratory failure (HCC)   Likely aspiration-continuing Unasyn, white count initially 11 currently 7. Noted lactic acidosis of 5 on admission currently down to 0.9. Is taking by mouth can narrow to Augmentin. Repeat chest x-ray 1/20 showed improvement of right lung field opacity Mild pulmonary edema, slight elevation of troponin--peaked at 0.8 but is trending down-noted on x-ray initially. Echocardiogram is pending Acute kidney injury-BUN/creatinine 16/1.2 on admission. Trending down currently. Continue Lasix as when necessary. Elevated glucose on admission. Discontinue blood sugar checks Hypertension-on no medications at home Obesity There is no height or weight on file to calculate BMI. Chronic low back pain continue Flexeril 5 mg 3 times a day   DVT prophylaxis: Lovenox Code Status: Full Family Communication: none present Disposition Plan: transfer to telel   Consultants:     Procedures:     Antimicrobials:   Unasyn 1/19-1/21  Augmentin 1/21    Subjective: Well Still cough and sputum No cp No unilat weak No n/v hasnt had stool sicne admit  Objective: Vitals:   12/16/16 0400 12/16/16 0434 12/16/16 0500 12/16/16 0600  BP: 128/81  125/81 123/83  Pulse: 65  81 64  Resp: 18  17 (!) 21  Temp:  98.5 F (36.9 C)    TempSrc:  Oral    SpO2: 98%  95% 94%  Weight:   113.8 kg (250 lb 14.1 oz)     Intake/Output Summary (Last 24 hours) at 12/16/16 0814 Last data filed at 12/16/16 4259  Gross per 24 hour  Intake           553.33 ml  Output             1455 ml   Net          -901.67 ml   Filed Weights   12/14/16 1507 12/15/16 0446 12/16/16 0500  Weight: 104.3 kg (230 lb) 115.9 kg (255 lb 8.2 oz) 113.8 kg (250 lb 14.1 oz)    Examination:  General exam: In chair seated Respiratory system: Clear to auscultation. Slight wheezy Cardiovascular system: S1 & S2 heard, RRR. No JVD Gastrointestinal system: Abdomen is nondistended, soft and nontender.  Central nervous system: Alert and oriented. No focal neurological deficits. Extremities: Symmetric 5 x 5 power. Skin: No rashes, lesions or ulcers Psychiatry: Judgement and insight appear normal. Mood & affect appropriate.     Data Reviewed: I have personally reviewed following labs and imaging studies  CBC:  Recent Labs Lab 12/14/16 1443 12/15/16 0328 12/16/16 0209  WBC 11.5* 8.3 7.5  NEUTROABS 5.9  --  5.1  HGB 13.9 12.9 13.1  HCT 45.7 41.2 41.0  MCV 85.7 83.2 82.3  PLT 334 256 563   Basic Metabolic Panel:  Recent Labs Lab 12/14/16 1443 12/15/16 0328 12/16/16 0209  NA 141 141 141  K 3.7 3.5 3.4*  CL 106 106 104  CO2 '22 27 29  ' GLUCOSE 244* 90 102*  BUN '16 15 15  ' CREATININE 1.27* 0.79 0.61  CALCIUM 9.0 9.1 9.1  MG  --  2.0 2.0  PHOS  --  2.3* 3.8   GFR: CrCl cannot  be calculated (Unknown ideal weight.). Liver Function Tests:  Recent Labs Lab 12/14/16 1443  AST 28  ALT 13*  ALKPHOS 80  BILITOT 0.5  PROT 7.3  ALBUMIN 4.0   No results for input(s): LIPASE, AMYLASE in the last 168 hours. No results for input(s): AMMONIA in the last 168 hours. Coagulation Profile:  Recent Labs Lab 12/14/16 1443  INR 1.05   Cardiac Enzymes:  Recent Labs Lab 12/14/16 1825 12/14/16 2134 12/15/16 0328 12/16/16 0209  TROPONINI 0.31* 0.78* 0.89* 0.19*   BNP (last 3 results) No results for input(s): PROBNP in the last 8760 hours. HbA1C:  Recent Labs  12/14/16 1825  HGBA1C 5.8*   CBG:  Recent Labs Lab 12/15/16 1240 12/15/16 1607 12/15/16 2053 12/16/16 0048  12/16/16 0432  GLUCAP 100* 151* 101* 121* 104*   Lipid Profile:  Recent Labs  12/14/16 1443  TRIG 106   Thyroid Function Tests: No results for input(s): TSH, T4TOTAL, FREET4, T3FREE, THYROIDAB in the last 72 hours. Anemia Panel: No results for input(s): VITAMINB12, FOLATE, FERRITIN, TIBC, IRON, RETICCTPCT in the last 72 hours. Sepsis Labs:  Recent Labs Lab 12/14/16 1519 12/14/16 1825 12/14/16 2011 12/15/16 0328 12/16/16 0209  PROCALCITON  --  <0.10  --  0.30  --   LATICACIDVEN 5.77* 2.1* 2.5*  --  0.9    Recent Results (from the past 240 hour(s))  Blood Culture (routine x 2)     Status: None (Preliminary result)   Collection Time: 12/14/16  3:40 PM  Result Value Ref Range Status   Specimen Description BLOOD LEFT ARM  Final   Special Requests BOTTLES DRAWN AEROBIC AND ANAEROBIC 5CC  Final   Culture NO GROWTH < 24 HOURS  Final   Report Status PENDING  Incomplete  Blood Culture (routine x 2)     Status: None (Preliminary result)   Collection Time: 12/14/16  3:54 PM  Result Value Ref Range Status   Specimen Description BLOOD RIGHT ARM  Final   Special Requests BOTTLES DRAWN AEROBIC AND ANAEROBIC 5CC  Final   Culture NO GROWTH < 24 HOURS  Final   Report Status PENDING  Incomplete  MRSA PCR Screening     Status: None   Collection Time: 12/14/16  8:55 PM  Result Value Ref Range Status   MRSA by PCR NEGATIVE NEGATIVE Final    Comment:        The GeneXpert MRSA Assay (FDA approved for NASAL specimens only), is one component of a comprehensive MRSA colonization surveillance program. It is not intended to diagnose MRSA infection nor to guide or monitor treatment for MRSA infections.          Radiology Studies: Ct Head Wo Contrast  Result Date: 12/14/2016 CLINICAL DATA:  Unresponsive, low O2 sats.  Intubated. EXAM: CT HEAD WITHOUT CONTRAST TECHNIQUE: Contiguous axial images were obtained from the base of the skull through the vertex without intravenous contrast.  COMPARISON:  None. FINDINGS: Brain: Ventricles are normal in size and configuration. All areas of the brain demonstrate grossly normal gray-white matter attenuation (vague low-density areas within the cerebellum are felt to be artifactual caused by beam hardening artifact). There is no mass, hemorrhage, edema or other evidence of acute parenchymal abnormality. No extra-axial hemorrhage. Vascular: There are chronic calcified atherosclerotic changes of the large vessels at the skull base. No unexpected hyperdense vessel. Skull: Normal. Negative for fracture or focal lesion. Sinuses/Orbits: No acute finding. Other: None. IMPRESSION: Negative head CT. No intracranial mass, hemorrhage or edema appreciated. Electronically  Signed   By: Franki Cabot M.D.   On: 12/14/2016 15:47   Dg Chest Port 1 View  Result Date: 12/15/2016 CLINICAL DATA:  Ventilation dependent EXAM: PORTABLE CHEST 1 VIEW COMPARISON:  December 14, 2016 FINDINGS: Stable ET tube in good position. No pneumothorax. Low lung volumes. The opacity in the right base has improved in the interval, likely representing atelectasis on the previous study. No focal infiltrates seen today. No acute abnormality. IMPRESSION: No acute abnormality.  Stable ET tube. Electronically Signed   By: Dorise Bullion III M.D   On: 12/15/2016 07:12   Dg Chest Portable 1 View  Result Date: 12/14/2016 CLINICAL DATA:  Intubated EXAM: PORTABLE CHEST 1 VIEW COMPARISON:  None. FINDINGS: Low lung volumes. Endotracheal tube tip is 3.6 cm above the carina. Top-normal heart size. Grossly normal mediastinal contour. No pneumothorax. No pleural effusion. There are hazy opacities in the mid to lower lungs bilaterally. IMPRESSION: 1. Well-positioned endotracheal tube. 2. Low lung volumes. Hazy opacities in the mid to lower lungs bilaterally, favor atelectasis, cannot exclude a component of aspiration or pneumonia. Electronically Signed   By: Ilona Sorrel M.D.   On: 12/14/2016 15:11         Scheduled Meds: . ampicillin-sulbactam (UNASYN) IV  3 g Intravenous Q8H  . chlorhexidine gluconate (MEDLINE KIT)  15 mL Mouth Rinse BID  . heparin  5,000 Units Subcutaneous Q8H  . insulin aspart  2-6 Units Subcutaneous Q4H  . mouth rinse  15 mL Mouth Rinse QID   Continuous Infusions:   LOS: 2 days    Time spent: 20    Nita Sells, MD [facility] Pager [phone]  If 7PM-7AM, please contact night-coverage [email or web address] Password [email or web address] 12/16/2016, 8:14 AM

## 2016-12-17 ENCOUNTER — Encounter: Payer: Self-pay | Admitting: Family Medicine

## 2016-12-17 LAB — CBC WITH DIFFERENTIAL/PLATELET
BASOS PCT: 1 %
Basophils Absolute: 0 10*3/uL (ref 0.0–0.1)
EOS ABS: 0.1 10*3/uL (ref 0.0–0.7)
Eosinophils Relative: 2 %
HCT: 40.4 % (ref 36.0–46.0)
HEMOGLOBIN: 12.9 g/dL (ref 12.0–15.0)
Lymphocytes Relative: 22 %
Lymphs Abs: 1.8 10*3/uL (ref 0.7–4.0)
MCH: 26.4 pg (ref 26.0–34.0)
MCHC: 31.9 g/dL (ref 30.0–36.0)
MCV: 82.6 fL (ref 78.0–100.0)
Monocytes Absolute: 0.8 10*3/uL (ref 0.1–1.0)
Monocytes Relative: 9 %
Neutro Abs: 5.4 10*3/uL (ref 1.7–7.7)
Neutrophils Relative %: 66 %
Platelets: 180 10*3/uL (ref 150–400)
RBC: 4.89 MIL/uL (ref 3.87–5.11)
RDW: 14.3 % (ref 11.5–15.5)
WBC: 8.1 10*3/uL (ref 4.0–10.5)

## 2016-12-17 LAB — COMPREHENSIVE METABOLIC PANEL
ALBUMIN: 3.6 g/dL (ref 3.5–5.0)
ALK PHOS: 65 U/L (ref 38–126)
ALT: 9 U/L — AB (ref 14–54)
AST: 17 U/L (ref 15–41)
Anion gap: 8 (ref 5–15)
BUN: 14 mg/dL (ref 6–20)
CALCIUM: 9 mg/dL (ref 8.9–10.3)
CO2: 28 mmol/L (ref 22–32)
CREATININE: 0.65 mg/dL (ref 0.44–1.00)
Chloride: 102 mmol/L (ref 101–111)
GFR calc Af Amer: >60 mL/min (ref >60)
GFR calc non Af Amer: >60 mL/min (ref >60)
GLUCOSE: 106 mg/dL — AB (ref 65–99)
Potassium: 3.7 mmol/L (ref 3.5–5.1)
SODIUM: 138 mmol/L (ref 135–145)
Total Bilirubin: 1.3 mg/dL — ABNORMAL HIGH (ref 0.3–1.2)
Total Protein: 7 g/dL (ref 6.5–8.1)

## 2016-12-17 LAB — MAGNESIUM: Magnesium: 2.2 mg/dL (ref 1.7–2.4)

## 2016-12-17 LAB — PHOSPHORUS: PHOSPHORUS: 4.4 mg/dL (ref 2.5–4.6)

## 2016-12-17 LAB — HEMOGLOBIN A1C
Hgb A1c MFr Bld: 5.8 % — ABNORMAL HIGH (ref 4.8–5.6)
Mean Plasma Glucose: 120 mg/dL

## 2016-12-17 MED ORDER — AMOXICILLIN-POT CLAVULANATE 875-125 MG PO TABS: 1.0000 | ORAL_TABLET | Freq: Two times a day (BID) | ORAL | 0 refills | Status: DC

## 2016-12-17 NOTE — Care Management Note (Signed)
Case Management Note  Patient Details  Name: Ariel Henry MRN: [id number] Date of Birth: [birth year]  Subjective/Objective:     Admitted with Acute Resp Failure               Action/Plan: Patient lives at home, PCP is Dr Vista Lawman; has private insurance with BCBS with prescription drug coverage; pharmacy of choice is [facility]; patient reports no problem getting her medication. No needs identified at this time. CM will continue to follow for DCP.  Expected Discharge Date:  12/17/16               Expected Discharge Plan:  Home/Self Care  In-House Referral:     Discharge planning Services  CM Consult  Status of Service:  Completed, signed off  Sherrilyn Rist [id number] 12/17/2016, 11:22 AM

## 2016-12-17 NOTE — Progress Notes (Signed)
Patient ambulated in hallway without 02 and 02 Sat = 97% - 100% on room air. Patient tolerated well without dyspnea during ambulation.

## 2016-12-17 NOTE — Progress Notes (Signed)
Reviewed all discharge instructions and patient stated understanding.  No voiced complaints

## 2016-12-17 NOTE — Discharge Summary (Signed)
Physician Discharge Summary  Ariel Henry [id number] DOB: 1951/05/19 DOA: 12/14/2016  PCP: No primary care provider on file.  Admit date: 12/14/2016 Discharge date: 12/17/2016  Time spent: 30 minutes  Recommendations for Outpatient Follow-up:  1. Please f/u thyroid issues as an outpatient   Discharge Diagnoses:  Active Problems:   Acute respiratory failure [facility])   Discharge Condition: Improved  Diet recommendation: Heart healthy  Filed Weights   12/16/16 0500 12/16/16 1707 12/17/16 0640  Weight: 113.8 kg (250 lb 14.1 oz) 110.4 kg (243 lb 6.4 oz) 113.9 kg (251 lb)    History of present illness:  66 year old female Past medical history other than hypertension obesity Admitted with choking episode and intubated by emergency room Admitted to critical care service 1/19 placed on ventilator and extubated 1/20  Initially intubated in the emergency room as unable to protect airway and nonresponsive was on ventilator for 2 days extubated Placed on Unasyn transition Augmentin Had some acute kidney injury which resolved on discharge She tells me that she has thyroid issues and this causes issues with swallowing. She is stable for discharge and was able to discharge 12/17/2016  Discharge Exam: Vitals:   12/17/16 0036 12/17/16 0640  BP: (!) 101/58 (!) 100/58  Pulse: 82 85  Resp: 18 (!) 22  Temp: 98 F (36.7 C) 98.1 F (36.7 C)    General: Alert pleasant oriented poor sleep overnight Cardiovascular: S1-S2 no murmur rub or gallop Respiratory: Clinically clear  Discharge Instructions   Discharge Instructions    Diet - low sodium heart healthy    Complete by:  As directed    Discharge instructions    Complete by:  As directed    Finish antibiotics Follow with primary care physician 1 week Please obtain follow-up for thyroid and other nonspecific issues   Increase activity slowly    Complete by:  As directed      Current Discharge Medication List    START  taking these medications   Details  amoxicillin-clavulanate (AUGMENTIN) 875-125 MG tablet Take 1 tablet by mouth every 12 (twelve) hours. Qty: 6 tablet, Refills: 0      CONTINUE these medications which have NOT CHANGED   Details  cyclobenzaprine (FLEXERIL) 5 MG tablet Take 5 mg by mouth 3 (three) times daily as needed for muscle spasms.    dorzolamide (TRUSOPT) 2 % ophthalmic solution Place 1 drop into both eyes 3 (three) times daily.    gabapentin (NEURONTIN) 300 MG capsule Take 300-600 mg by mouth See admin instructions. 600mg  in morning and afternoon, 300mg  at bedtime    hydrOXYzine (ATARAX/VISTARIL) 25 MG tablet Take 25 mg by mouth 4 (four) times daily as needed for anxiety.    Timolol Maleate PF 0.5 % SOLN Apply 1 drop to eye every morning.       No Known Allergies    The results of significant diagnostics from this hospitalization (including imaging, microbiology, ancillary and laboratory) are listed below for reference.    Significant Diagnostic Studies: Ct Head Wo Contrast  Result Date: 12/14/2016 CLINICAL DATA:  Unresponsive, low O2 sats.  Intubated. EXAM: CT HEAD WITHOUT CONTRAST TECHNIQUE: Contiguous axial images were obtained from the base of the skull through the vertex without intravenous contrast. COMPARISON:  None. FINDINGS: Brain: Ventricles are normal in size and configuration. All areas of the brain demonstrate grossly normal gray-white matter attenuation (vague low-density areas within the cerebellum are felt to be artifactual caused by beam hardening artifact). There is no mass, hemorrhage, edema or other  evidence of acute parenchymal abnormality. No extra-axial hemorrhage. Vascular: There are chronic calcified atherosclerotic changes of the large vessels at the skull base. No unexpected hyperdense vessel. Skull: Normal. Negative for fracture or focal lesion. Sinuses/Orbits: No acute finding. Other: None. IMPRESSION: Negative head CT. No intracranial mass, hemorrhage  or edema appreciated. Electronically Signed   By: Franki Cabot M.D.   On: 12/14/2016 15:47   Dg Chest Port 1 View  Result Date: 12/15/2016 CLINICAL DATA:  Ventilation dependent EXAM: PORTABLE CHEST 1 VIEW COMPARISON:  December 14, 2016 FINDINGS: Stable ET tube in good position. No pneumothorax. Low lung volumes. The opacity in the right base has improved in the interval, likely representing atelectasis on the previous study. No focal infiltrates seen today. No acute abnormality. IMPRESSION: No acute abnormality.  Stable ET tube. Electronically Signed   By: Dorise Bullion III M.D   On: 12/15/2016 07:12   Dg Chest Portable 1 View  Result Date: 12/14/2016 CLINICAL DATA:  Intubated EXAM: PORTABLE CHEST 1 VIEW COMPARISON:  None. FINDINGS: Low lung volumes. Endotracheal tube tip is 3.6 cm above the carina. Top-normal heart size. Grossly normal mediastinal contour. No pneumothorax. No pleural effusion. There are hazy opacities in the mid to lower lungs bilaterally. IMPRESSION: 1. Well-positioned endotracheal tube. 2. Low lung volumes. Hazy opacities in the mid to lower lungs bilaterally, favor atelectasis, cannot exclude a component of aspiration or pneumonia. Electronically Signed   By: Ilona Sorrel M.D.   On: 12/14/2016 15:11    Microbiology: Recent Results (from the past 240 hour(s))  Blood Culture (routine x 2)     Status: None (Preliminary result)   Collection Time: 12/14/16  3:40 PM  Result Value Ref Range Status   Specimen Description BLOOD LEFT ARM  Final   Special Requests BOTTLES DRAWN AEROBIC AND ANAEROBIC 5CC  Final   Culture NO GROWTH 2 DAYS  Final   Report Status PENDING  Incomplete  Blood Culture (routine x 2)     Status: None (Preliminary result)   Collection Time: 12/14/16  3:54 PM  Result Value Ref Range Status   Specimen Description BLOOD RIGHT ARM  Final   Special Requests BOTTLES DRAWN AEROBIC AND ANAEROBIC 5CC  Final   Culture NO GROWTH 2 DAYS  Final   Report Status PENDING   Incomplete  MRSA PCR Screening     Status: None   Collection Time: 12/14/16  8:55 PM  Result Value Ref Range Status   MRSA by PCR NEGATIVE NEGATIVE Final    Comment:        The GeneXpert MRSA Assay (FDA approved for NASAL specimens only), is one component of a comprehensive MRSA colonization surveillance program. It is not intended to diagnose MRSA infection nor to guide or monitor treatment for MRSA infections.   Urine culture     Status: None   Collection Time: 12/14/16 10:00 PM  Result Value Ref Range Status   Specimen Description URINE, RANDOM  Final   Special Requests NONE  Final   Culture NO GROWTH  Final   Report Status 12/16/2016 FINAL  Final     Labs: Basic Metabolic Panel:  Recent Labs Lab 12/14/16 1443 12/15/16 0328 12/16/16 0209 12/17/16 0544  NA 141 141 141 138  K 3.7 3.5 3.4* 3.7  CL 106 106 104 102  CO2 22 27 29 28   GLUCOSE 244* 90 102* 106*  BUN 16 15 15 14   CREATININE 1.27* 0.79 0.61 0.65  CALCIUM 9.0 9.1 9.1 9.0  MG  --  2.0 2.0 2.2  PHOS  --  2.3* 3.8 4.4   Liver Function Tests:  Recent Labs Lab 12/14/16 1443 12/17/16 0544  AST 28 17  ALT 13* 9*  ALKPHOS 80 65  BILITOT 0.5 1.3*  PROT 7.3 7.0  ALBUMIN 4.0 3.6   No results for input(s): LIPASE, AMYLASE in the last 168 hours. No results for input(s): AMMONIA in the last 168 hours. CBC:  Recent Labs Lab 12/14/16 1443 12/15/16 0328 12/16/16 0209 12/17/16 0544  WBC 11.5* 8.3 7.5 8.1  NEUTROABS 5.9  --  5.1 5.4  HGB 13.9 12.9 13.1 12.9  HCT 45.7 41.2 41.0 40.4  MCV 85.7 83.2 82.3 82.6  PLT 334 256 232 180   Cardiac Enzymes:  Recent Labs Lab 12/14/16 1825 12/14/16 2134 12/15/16 0328 12/16/16 0209  TROPONINI 0.31* 0.78* 0.89* 0.19*   BNP: BNP (last 3 results)  Recent Labs  12/14/16 1443 12/14/16 1825  BNP 25.4 16.4    ProBNP (last 3 results) No results for input(s): PROBNP in the last 8760 hours.  CBG:  Recent Labs Lab 12/16/16 0048 12/16/16 0432  12/16/16 0845 12/16/16 1109 12/16/16 1604  GLUCAP 121* 104* 111* 105* 94       Signed:  Nita Sells MD   [facility] 12/17/2016, 10:03 AM

## 2016-12-19 LAB — CULTURE, BLOOD (ROUTINE X 2)
CULTURE: NO GROWTH
CULTURE: NO GROWTH

## 2016-12-21 ENCOUNTER — Encounter (HOSPITAL_COMMUNITY): Payer: Self-pay

## 2016-12-21 ENCOUNTER — Ambulatory Visit (INDEPENDENT_AMBULATORY_CARE_PROVIDER_SITE_OTHER): Payer: BLUE CROSS/BLUE SHIELD

## 2016-12-21 ENCOUNTER — Emergency Department (HOSPITAL_COMMUNITY): Payer: BLUE CROSS/BLUE SHIELD

## 2016-12-21 ENCOUNTER — Ambulatory Visit (HOSPITAL_COMMUNITY)
Admission: EM | Admit: 2016-12-21 | Discharge: 2016-12-21 | Disposition: A | Discharge status: Home / Self Care | Payer: BLUE CROSS/BLUE SHIELD | Source: Home / Self Care | Attending: Family Medicine | Admitting: Family Medicine

## 2016-12-21 ENCOUNTER — Observation Stay (HOSPITAL_COMMUNITY)
Admission: EM | Admit: 2016-12-21 | Discharge: 2016-12-24 | Disposition: A | Discharge status: Home / Self Care | Payer: BLUE CROSS/BLUE SHIELD | Attending: Internal Medicine | Admitting: Internal Medicine

## 2016-12-21 ENCOUNTER — Encounter (HOSPITAL_COMMUNITY): Payer: Self-pay | Admitting: Emergency Medicine

## 2016-12-21 DIAGNOSIS — R05 Cough: Secondary | ICD-10-CM

## 2016-12-21 DIAGNOSIS — I700 Atherosclerosis of aorta: Secondary | ICD-10-CM | POA: Diagnosis not present

## 2016-12-21 DIAGNOSIS — M1990 Unspecified osteoarthritis, unspecified site: Secondary | ICD-10-CM | POA: Insufficient documentation

## 2016-12-21 DIAGNOSIS — E039 Hypothyroidism, unspecified: Secondary | ICD-10-CM

## 2016-12-21 DIAGNOSIS — E049 Nontoxic goiter, unspecified: Secondary | ICD-10-CM | POA: Insufficient documentation

## 2016-12-21 DIAGNOSIS — Z8701 Personal history of pneumonia (recurrent): Secondary | ICD-10-CM | POA: Diagnosis not present

## 2016-12-21 DIAGNOSIS — H409 Unspecified glaucoma: Secondary | ICD-10-CM | POA: Diagnosis not present

## 2016-12-21 DIAGNOSIS — K8020 Calculus of gallbladder without cholecystitis without obstruction: Secondary | ICD-10-CM | POA: Diagnosis not present

## 2016-12-21 DIAGNOSIS — R0602 Shortness of breath: Secondary | ICD-10-CM

## 2016-12-21 DIAGNOSIS — I82441 Acute embolism and thrombosis of right tibial vein: Secondary | ICD-10-CM | POA: Diagnosis not present

## 2016-12-21 DIAGNOSIS — I2699 Other pulmonary embolism without acute cor pulmonale: Secondary | ICD-10-CM | POA: Diagnosis present

## 2016-12-21 DIAGNOSIS — K760 Fatty (change of) liver, not elsewhere classified: Secondary | ICD-10-CM | POA: Insufficient documentation

## 2016-12-21 DIAGNOSIS — R0609 Other forms of dyspnea: Secondary | ICD-10-CM

## 2016-12-21 DIAGNOSIS — Z833 Family history of diabetes mellitus: Secondary | ICD-10-CM | POA: Diagnosis not present

## 2016-12-21 DIAGNOSIS — D175 Benign lipomatous neoplasm of intra-abdominal organs: Secondary | ICD-10-CM | POA: Insufficient documentation

## 2016-12-21 DIAGNOSIS — Z6841 Body Mass Index (BMI) 40.0 and over, adult: Secondary | ICD-10-CM | POA: Insufficient documentation

## 2016-12-21 DIAGNOSIS — Z8249 Family history of ischemic heart disease and other diseases of the circulatory system: Secondary | ICD-10-CM | POA: Diagnosis not present

## 2016-12-21 DIAGNOSIS — J398 Other specified diseases of upper respiratory tract: Secondary | ICD-10-CM | POA: Insufficient documentation

## 2016-12-21 DIAGNOSIS — R0600 Dyspnea, unspecified: Secondary | ICD-10-CM

## 2016-12-21 DIAGNOSIS — Z7982 Long term (current) use of aspirin: Secondary | ICD-10-CM | POA: Insufficient documentation

## 2016-12-21 DIAGNOSIS — M79604 Pain in right leg: Secondary | ICD-10-CM

## 2016-12-21 DIAGNOSIS — R059 Cough, unspecified: Secondary | ICD-10-CM

## 2016-12-21 LAB — I-STAT ARTERIAL BLOOD GAS, ED
Acid-Base Excess: 1 mmol/L (ref 0.0–2.0)
BICARBONATE: 25 mmol/L (ref 20.0–28.0)
O2 SAT: 93 %
PCO2 ART: 38.3 mmHg (ref 32.0–48.0)
PO2 ART: 66 mmHg — AB (ref 83.0–108.0)
Patient temperature: 98.6
TCO2: 26 mmol/L (ref 0–100)
pH, Arterial: 7.423 (ref 7.350–7.450)

## 2016-12-21 LAB — COMPREHENSIVE METABOLIC PANEL
ALT: 12 U/L — ABNORMAL LOW (ref 14–54)
AST: 16 U/L (ref 15–41)
Albumin: 3.6 g/dL (ref 3.5–5.0)
Alkaline Phosphatase: 59 U/L (ref 38–126)
Anion gap: 9 (ref 5–15)
BUN: 11 mg/dL (ref 6–20)
CHLORIDE: 107 mmol/L (ref 101–111)
CO2: 25 mmol/L (ref 22–32)
Calcium: 9.3 mg/dL (ref 8.9–10.3)
Creatinine, Ser: 0.6 mg/dL (ref 0.44–1.00)
Glucose, Bld: 78 mg/dL (ref 65–99)
POTASSIUM: 3.8 mmol/L (ref 3.5–5.1)
Sodium: 141 mmol/L (ref 135–145)
Total Bilirubin: 0.7 mg/dL (ref 0.3–1.2)
Total Protein: 7 g/dL (ref 6.5–8.1)

## 2016-12-21 LAB — CBC WITH DIFFERENTIAL/PLATELET
Basophils Absolute: 0 10*3/uL (ref 0.0–0.1)
Basophils Relative: 0 %
Eosinophils Absolute: 0.2 10*3/uL (ref 0.0–0.7)
Eosinophils Relative: 3 %
HEMATOCRIT: 38.8 % (ref 36.0–46.0)
Hemoglobin: 12.2 g/dL (ref 12.0–15.0)
LYMPHS ABS: 1.3 10*3/uL (ref 0.7–4.0)
LYMPHS PCT: 20 %
MCH: 26.2 pg (ref 26.0–34.0)
MCHC: 31.4 g/dL (ref 30.0–36.0)
MCV: 83.4 fL (ref 78.0–100.0)
MONOS PCT: 5 %
Monocytes Absolute: 0.4 10*3/uL (ref 0.1–1.0)
NEUTROS PCT: 72 %
Neutro Abs: 4.7 10*3/uL (ref 1.7–7.7)
Platelets: 200 10*3/uL (ref 150–400)
RBC: 4.65 MIL/uL (ref 3.87–5.11)
RDW: 14.4 % (ref 11.5–15.5)
WBC: 6.5 10*3/uL (ref 4.0–10.5)

## 2016-12-21 LAB — D-DIMER, QUANTITATIVE (NOT AT ARMC)

## 2016-12-21 LAB — I-STAT TROPONIN, ED: Troponin i, poc: 0 ng/mL (ref 0.00–0.08)

## 2016-12-21 MED ORDER — IPRATROPIUM-ALBUTEROL 0.5-2.5 (3) MG/3ML IN SOLN
3.0000 mL | Freq: Once | RESPIRATORY_TRACT | Status: AC
  Administered 2016-12-21: 3 mL via RESPIRATORY_TRACT

## 2016-12-21 MED ORDER — DEXAMETHASONE SODIUM PHOSPHATE 10 MG/ML IJ SOLN
10.0000 mg | Freq: Once | INTRAMUSCULAR | Status: AC
  Administered 2016-12-21: 10 mg via INTRAMUSCULAR

## 2016-12-21 MED ORDER — DEXAMETHASONE SODIUM PHOSPHATE 10 MG/ML IJ SOLN
INTRAMUSCULAR | Status: AC
  Filled 2016-12-21: qty 1

## 2016-12-21 MED ORDER — HEPARIN (PORCINE) IN NACL 100-0.45 UNIT/ML-% IJ SOLN
1300.0000 [IU]/h | INTRAMUSCULAR | Status: DC
  Administered 2016-12-22: 1300 [IU]/h via INTRAVENOUS
  Filled 2016-12-21: qty 250

## 2016-12-21 MED ORDER — IOPAMIDOL (ISOVUE-370) INJECTION 76%
INTRAVENOUS | Status: AC
  Administered 2016-12-21: 100 mL
  Administered 2016-12-22: 01:00:00
  Filled 2016-12-21: qty 100

## 2016-12-21 MED ORDER — IPRATROPIUM-ALBUTEROL 0.5-2.5 (3) MG/3ML IN SOLN
RESPIRATORY_TRACT | Status: AC
  Filled 2016-12-21: qty 3

## 2016-12-21 MED ORDER — HEPARIN BOLUS VIA INFUSION
5000.0000 [IU] | Freq: Once | INTRAVENOUS | Status: AC
  Administered 2016-12-22: 5000 [IU] via INTRAVENOUS
  Filled 2016-12-21: qty 5000

## 2016-12-21 NOTE — ED Triage Notes (Signed)
Pt went to the ER for a pill stuck in her throat on Friday and was discharged on Monday and was given a steroid shot and inhaler but still having trouble breathing, wheezing, feels like her air supply is cut off when she coughs.

## 2016-12-21 NOTE — ED Notes (Addendum)
Made two unsuccessful attempts at IV access. Placing IV Team consult.

## 2016-12-21 NOTE — Progress Notes (Signed)
ANTICOAGULATION CONSULT NOTE - Initial Consult  Pharmacy Consult for Heparin Indication: pulmonary embolus  No Known Allergies  Patient Measurements: Height: 5\' 3"  (160 cm) Weight: 251 lb (113.9 kg) IBW/kg (Calculated) : 52.4 Heparin Dosing Weight: 80 kg  Vital Signs: Temp: 97.4 F (36.3 C) (01/26 1533) Temp Source: Oral (01/26 1533) BP: 138/87 (01/26 2145) Pulse Rate: 73 (01/26 2145)  Labs:  Recent Labs  12/21/16 1539  HGB 12.2  HCT 38.8  PLT 200  CREATININE 0.60    Estimated Creatinine Clearance: 87.5 mL/min (by C-G formula based on SCr of 0.6 mg/dL).   Medical History: Past Medical History:  Diagnosis Date  . Arthritis   . Glaucoma   . Neuromuscular disorder (HCC)    carpal tunnel both wrists  . Pneumonia   . Thyroid disease     Medications:  See electronic med rec  Assessment: 66 y.o. F presents with SOB. CT scan positive for pulmonary artery embolus. No definite CT evidence of right cardiac strain. No AC PTA. CBC ok on admission. To start heparin for PE.  Goal of Therapy:  Heparin level 0.3-0.7 units/ml Monitor platelets by anticoagulation protocol: Yes   Plan:  Heparin IV bolus 5000 units Heparin gtt at 1300 units/hr Will f/u heparin level in 6 hours Daily heparin level and CBC  Sherlon Handing, PharmD, BCPS Clinical pharmacist, pager [phone] 12/21/2016,10:51 PM

## 2016-12-21 NOTE — ED Notes (Addendum)
Pt ambulated to and from bathroom with marked SOB upon return to bed. Breathing sounds congested and junky in room, pt only able to speak a few words between breaths.

## 2016-12-21 NOTE — ED Triage Notes (Signed)
Pt reports she was sent over from urgent care for further evaluation following the chest xray she had done due to c/o sob. Pt reports she was d/c Monday and c/o sob since then.

## 2016-12-21 NOTE — ED Notes (Addendum)
Pt ambulated from lobby to room, walking approx 100 feet. Pt became increasingly SOB but was not in distress. Pt seated in chair at bedside. Currently 97% on room air.

## 2016-12-21 NOTE — ED Provider Notes (Signed)
CSN: [id number]     Arrival date & time 12/21/16  1309 History   First MD Initiated Contact with Patient 12/21/16 1423     Chief Complaint  Patient presents with  . Shortness of Breath   (Consider location/radiation/quality/duration/timing/severity/associated sxs/prior Treatment) 66 year old female presents to clinic with chief complaint of shortness of breath and wheezing. On Friday 12/14/16, she reports she had taken a Mucinex pill, it went down her lungs and she chocked.  She required hospitalization and was intubated for two days, discharged on Monday. She reports her breathing has not improved since discharge.   The history is provided by the patient.  Shortness of Breath    Past Medical History:  Diagnosis Date  . Arthritis   . Glaucoma   . Neuromuscular disorder (HCC)    carpal tunnel both wrists  . Pneumonia   . Thyroid disease    Past Surgical History:  Procedure Laterality Date  . ABDOMINAL HYSTERECTOMY    . bladder tack    . COLONOSCOPY WITH PROPOFOL N/A 09/04/2016   Procedure: COLONOSCOPY WITH PROPOFOL;  Surgeon: Mauri Pole, MD;  Location: [facility];  Service: Endoscopy;  Laterality: N/A;  . EYE SURGERY     bilateral cataract extraction   Family History  Problem Relation Age of Onset  . Colon cancer Neg Hx    Social History  Substance Use Topics  . Smoking status: Never Smoker  . Smokeless tobacco: Never Used  . Alcohol use No   OB History    No data available     Review of Systems  Reason unable to perform ROS: as covered in HPI.  Respiratory: Positive for shortness of breath.   All other systems reviewed and are negative.   Allergies  Patient has no known allergies.  Home Medications   Prior to Admission medications   Medication Sig Start Date End Date Taking? Authorizing Provider  albuterol (PROVENTIL HFA;VENTOLIN HFA) 108 (90 BASE) MCG/ACT inhaler Inhale 1-2 puffs into the lungs every 6 (six) hours as needed for wheezing. 07/12/13    Fredia Sorrow, MD  amoxicillin-clavulanate (AUGMENTIN) 875-125 MG tablet Take 1 tablet by mouth every 12 (twelve) hours. 12/17/16   Nita Sells, MD  aspirin EC 81 MG tablet Take 81 mg by mouth daily.    Historical Provider, MD  cholecalciferol (VITAMIN D) 1000 units tablet Take 1,000 Units by mouth daily.    Historical Provider, MD  cyclobenzaprine (FLEXERIL) 5 MG tablet Take 5 mg by mouth 3 (three) times daily as needed for muscle spasms.    Historical Provider, MD  dorzolamide (TRUSOPT) 2 % ophthalmic solution 1 drop 3 (three) times daily.    Historical Provider, MD  dorzolamide (TRUSOPT) 2 % ophthalmic solution Place 1 drop into both eyes 3 (three) times daily.    Historical Provider, MD  gabapentin (NEURONTIN) 300 MG capsule Take 300 mg by mouth at bedtime.    Historical Provider, MD  gabapentin (NEURONTIN) 300 MG capsule Take 300-600 mg by mouth See admin instructions. 600mg  in morning and afternoon, 300mg  at bedtime    Historical Provider, MD  hydrOXYzine (ATARAX/VISTARIL) 25 MG tablet Take 25 mg by mouth 4 (four) times daily as needed for anxiety.    Historical Provider, MD  latanoprost (XALATAN) 0.005 % ophthalmic solution Place 1 drop into both eyes at bedtime.    Historical Provider, MD  Polyethyl Glycol-Propyl Glycol (SYSTANE ULTRA OP) Apply to eye.    Historical Provider, MD  predniSONE (DELTASONE) 10 MG tablet  Take 10 mg by mouth daily with breakfast. Started on Thursday 07/31/2016-take 6 pills 1st day, then 5 pills for 2 days, then 4 pills for 2 days, then 4 pills for 2 days, then 3 pills for 2 days, then 2 pills for 2 days then 1 pill for 2 days and then finished    Historical Provider, MD  PRESCRIPTION MEDICATION ciproflaxacine 0.2% ear gtts for "ringing in my ears"    Historical Provider, MD  pyridOXINE (VITAMIN B-6) 100 MG tablet Take 100 mg by mouth daily.    Historical Provider, MD  timolol (TIMOPTIC) 0.5 % ophthalmic solution 1 drop 2 (two) times daily.    Historical  Provider, MD  Timolol Maleate PF 0.5 % SOLN Apply 1 drop to eye every morning.    Historical Provider, MD   Meds Ordered and Administered this Visit   Medications  ipratropium-albuterol (DUONEB) 0.5-2.5 (3) MG/3ML nebulizer solution 3 mL (3 mLs Nebulization Given 12/21/16 1451)  dexamethasone (DECADRON) injection 10 mg (10 mg Intramuscular Given 12/21/16 1447)    BP 122/62 (BP Location: Right Arm)   Pulse 74   Temp 97.7 F (36.5 C) (Oral)   Resp 20   SpO2 100%  No data found.   Physical Exam  Constitutional: She is oriented to person, place, and time. She appears well-nourished. No distress.  HENT:  Head: Normocephalic and atraumatic.  Cardiovascular: Normal rate and regular rhythm.   Pulmonary/Chest: Effort normal. She has wheezes in the right middle field, the left middle field and the left lower field. She has rhonchi in the right upper field, the right middle field, the right lower field, the left upper field, the left middle field and the left lower field.  Abdominal: Soft. Bowel sounds are normal.  Neurological: She is alert and oriented to person, place, and time.  Skin: Skin is warm and dry. Capillary refill takes less than 2 seconds. She is not diaphoretic.  Psychiatric: She has a normal mood and affect.  Nursing note and vitals reviewed.   Urgent Care Course     Procedures (including critical care time)  Labs Review Labs Reviewed - No data to display  Imaging Review Dg Chest 2 View  Result Date: 12/21/2016 CLINICAL DATA:  Cough and shortness of breath for 2 weeks, audible rhonchi and wheezing, recent hospitalization with intubation EXAM: CHEST  2 VIEW COMPARISON:  10/05/2013, correlation CT chest 02/16/2016 FINDINGS: Enlargement of cardiac silhouette. Tortuous aorta with atherosclerotic calcification. Enlargement of superior mediastinum with tracheal deviation to the RIGHT corresponding to large superior mediastinal soft tissue mass identified on prior CT exam, by  ultrasound felt to likely represent a large goiter. Eventration RIGHT diaphragm. Bronchitic changes with RIGHT basilar atelectasis. No definite infiltrate, pleural effusion or pneumothorax. IMPRESSION: Superior mediastinal mass/mass effect corresponding to soft tissue mass identified on prior CT, felt to be a goiter by prior thyroid ultrasound. Aortic atherosclerosis. Bronchitic changes with RIGHT basilar atelectasis. Electronically Signed   By: Lavonia Dana M.D.   On: 12/21/2016 14:50     Visual Acuity Review  Right Eye Distance:   Left Eye Distance:   Bilateral Distance:    Right Eye Near:   Left Eye Near:    Bilateral Near:         MDM   1. Dyspnea on exertion   2. SOB (shortness of breath)   3. Cough    Patient received Pa/lateral chest xrays and duoneb 2.5 mg albuterol and 500 mcg of atrovent along with 10 mg  of Dexamethasone Im. Pt reports improvement in symptoms and lung sounds improved however still recommend seeking evaluation in ER due to her history.  I am concerned you may by having complications related to your hospitalization secondary to you being intubated and ventilated. Your lungs had significant rhonchi and wheezing prior to your DuoNeb treatment and your chest xray was significant for having bronchitic changes and right atelectasis. I am concerned given your history you may have a lung infection or other complication. I recommend you seek further treatment at the emergency room for further evaluation of your condition.     Barnet Glasgow, NP 12/21/16 1513

## 2016-12-21 NOTE — ED Notes (Signed)
Patient transported to CT 

## 2016-12-21 NOTE — ED Notes (Signed)
Pt adv NPO... Pt verb understanding.

## 2016-12-21 NOTE — ED Provider Notes (Signed)
[facility] DEPT Provider Note   CSN: [id number] Arrival date & time: 12/21/16  1525     History   Chief Complaint Chief Complaint  Patient presents with  . Shortness of Breath    HPI Ariel Henry is a 66 y.o. female.  HPI   Patient is a 66 year old female presenting with shortness of breath. Patient had a choking episode on a Mucinex pill on the 19th of this month. She was intubated in the emergency room and admitted to critical care. Patient was given Unasyn, extubated the next day, and discharged home on Monday. Patient has reported that since then she is becoming shortness of breath. She still has shortness of breath, cough. She went to urgent care today and was given steroids, albuterol treatment. They had her come to the emergency department for further evaluation. Patient was not hypoxic.  Past Medical History:  Diagnosis Date  . Arthritis   . Glaucoma   . Neuromuscular disorder (HCC)    carpal tunnel both wrists  . Pneumonia   . Thyroid disease     Patient Active Problem List   Diagnosis Date Noted  . Acute respiratory failure ([place]) 12/14/2016  . Special screening for malignant neoplasms, colon   . Lipoma of colon     Past Surgical History:  Procedure Laterality Date  . ABDOMINAL HYSTERECTOMY    . bladder tack    . COLONOSCOPY WITH PROPOFOL N/A 09/04/2016   Procedure: COLONOSCOPY WITH PROPOFOL;  Surgeon: Mauri Pole, MD;  Location: [facility];  Service: Endoscopy;  Laterality: N/A;  . EYE SURGERY     bilateral cataract extraction    OB History    No data available       Home Medications    Prior to Admission medications   Medication Sig Start Date End Date Taking? Authorizing Provider  albuterol (PROVENTIL HFA;VENTOLIN HFA) 108 (90 BASE) MCG/ACT inhaler Inhale 1-2 puffs into the lungs every 6 (six) hours as needed for wheezing. 07/12/13  Yes Fredia Sorrow, MD  aspirin EC 81 MG tablet Take 81 mg by mouth daily.   Yes Historical  Provider, MD  cholecalciferol (VITAMIN D) 1000 units tablet Take 1,000 Units by mouth daily.   Yes Historical Provider, MD  dorzolamide (TRUSOPT) 2 % ophthalmic solution 1 drop 3 (three) times daily.   Yes Historical Provider, MD  gabapentin (NEURONTIN) 300 MG capsule Take 300 mg by mouth at bedtime.   Yes Historical Provider, MD  gabapentin (NEURONTIN) 300 MG capsule Take 300 mg by mouth 2 (two) times daily. 600mg  in morning and afternoon, 300mg  at bedtime    Yes Historical Provider, MD  latanoprost (XALATAN) 0.005 % ophthalmic solution Place 1 drop into both eyes at bedtime.   Yes Historical Provider, MD  Polyethyl Glycol-Propyl Glycol (SYSTANE ULTRA OP) Apply to eye.   Yes Historical Provider, MD  pyridOXINE (VITAMIN B-6) 100 MG tablet Take 100 mg by mouth daily.   Yes Historical Provider, MD  timolol (TIMOPTIC) 0.5 % ophthalmic solution 1 drop 2 (two) times daily.   Yes Historical Provider, MD  Timolol Maleate PF 0.5 % SOLN Apply 1 drop to eye every morning.   Yes Historical Provider, MD  amoxicillin-clavulanate (AUGMENTIN) 875-125 MG tablet Take 1 tablet by mouth every 12 (twelve) hours. Patient not taking: Reported on 12/21/2016 12/17/16   Nita Sells, MD  cyclobenzaprine (FLEXERIL) 5 MG tablet Take 5 mg by mouth 3 (three) times daily as needed for muscle spasms.    Historical Provider, MD  dorzolamide (TRUSOPT) 2 % ophthalmic solution Place 1 drop into both eyes 3 (three) times daily.    Historical Provider, MD  hydrOXYzine (ATARAX/VISTARIL) 25 MG tablet Take 25 mg by mouth 4 (four) times daily as needed for anxiety.    Historical Provider, MD  predniSONE (DELTASONE) 10 MG tablet Take 10 mg by mouth daily with breakfast. Started on Thursday 07/31/2016-take 6 pills 1st day, then 5 pills for 2 days, then 4 pills for 2 days, then 4 pills for 2 days, then 3 pills for 2 days, then 2 pills for 2 days then 1 pill for 2 days and then finished    Historical Provider, MD  PRESCRIPTION MEDICATION  ciproflaxacine 0.2% ear gtts for "ringing in my ears"    Historical Provider, MD    Family History Family History  Problem Relation Age of Onset  . Colon cancer Neg Hx     Social History Social History  Substance Use Topics  . Smoking status: Never Smoker  . Smokeless tobacco: Never Used  . Alcohol use No     Allergies   Patient has no known allergies.   Review of Systems Review of Systems  Constitutional: Positive for fatigue. Negative for activity change and fever.  Respiratory: Positive for cough and chest tightness.   Cardiovascular: Negative for chest pain.  Gastrointestinal: Negative for abdominal pain.     Physical Exam Updated Vital Signs BP 138/87   Pulse 73   Temp 97.4 F (36.3 C) (Oral)   Resp 26   Ht 5\' 3"  (1.6 m)   Wt 251 lb (113.9 kg)   SpO2 94%   BMI 44.46 kg/m   Physical Exam  Constitutional: She is oriented to person, place, and time. She appears well-developed and well-nourished.  HENT:  Head: Normocephalic and atraumatic.  Eyes: Right eye exhibits no discharge. Left eye exhibits no discharge.  Cardiovascular: Normal rate and regular rhythm.   Pulmonary/Chest: Effort normal.  Mild tachypnea, diffuse mild rhonchi, no wheezing  Abdominal: She exhibits no distension. There is no tenderness.  Musculoskeletal: Normal range of motion. She exhibits no edema.  Neurological: She is oriented to person, place, and time.  Skin: Skin is warm and dry. She is not diaphoretic.  Psychiatric: She has a normal mood and affect.  Nursing note and vitals reviewed.    ED Treatments / Results  Labs (all labs ordered are listed, but only abnormal results are displayed) Labs Reviewed  COMPREHENSIVE METABOLIC PANEL - Abnormal; Notable for the following:       Result Value   ALT 12 (*)    All other components within normal limits  D-DIMER, QUANTITATIVE (NOT AT [facility]) - Abnormal; Notable for the following:    D-Dimer, Quant >20.00 (*)    All other components  within normal limits  I-STAT ARTERIAL BLOOD GAS, ED - Abnormal; Notable for the following:    pO2, Arterial 66.0 (*)    All other components within normal limits  CBC WITH DIFFERENTIAL/PLATELET  Randolm Idol, ED    EKG  EKG Interpretation  Date/Time:  Friday December 21 2016 15:30:31 EST Ventricular Rate:  75 PR Interval:  144 QRS Duration: 82 QT Interval:  368 QTC Calculation: 410 R Axis:   5 Text Interpretation:  Normal sinus rhythm with sinus arrhythmia Inferior infarct , age undetermined Cannot rule out Anterior infarct , age undetermined Abnormal ECG no evidence of acute infacrtion.  Confirmed by Gerald Leitz ([id number]) on 12/21/2016 5:44:09 PM  Radiology Dg Chest 2 View  Result Date: 12/21/2016 CLINICAL DATA:  Cough and shortness of breath for 2 weeks, audible rhonchi and wheezing, recent hospitalization with intubation EXAM: CHEST  2 VIEW COMPARISON:  10/05/2013, correlation CT chest 02/16/2016 FINDINGS: Enlargement of cardiac silhouette. Tortuous aorta with atherosclerotic calcification. Enlargement of superior mediastinum with tracheal deviation to the RIGHT corresponding to large superior mediastinal soft tissue mass identified on prior CT exam, by ultrasound felt to likely represent a large goiter. Eventration RIGHT diaphragm. Bronchitic changes with RIGHT basilar atelectasis. No definite infiltrate, pleural effusion or pneumothorax. IMPRESSION: Superior mediastinal mass/mass effect corresponding to soft tissue mass identified on prior CT, felt to be a goiter by prior thyroid ultrasound. Aortic atherosclerosis. Bronchitic changes with RIGHT basilar atelectasis. Electronically Signed   By: Lavonia Dana M.D.   On: 12/21/2016 14:50   Ct Angio Chest Pe W And/or Wo Contrast  Result Date: 12/21/2016 CLINICAL DATA:  66 year old female with elevated D-dimer presenting with shortness of breath. Recent hospitalization. EXAM: CT ANGIOGRAPHY CHEST WITH CONTRAST TECHNIQUE:  Multidetector CT imaging of the chest was performed using the standard protocol during bolus administration of intravenous contrast. Multiplanar CT image reconstructions and MIPs were obtained to evaluate the vascular anatomy. CONTRAST:  100 cc Isovue 370 COMPARISON:  Chest radiograph 12/21/2016 and chest CT dated 02/16/2016 FINDINGS: Cardiovascular: There is mild cardiomegaly with mild enlargement of the left ventricle. No pericardial effusion. The thoracic aorta is unremarkable. The origins of the great vessels of the aortic arch appear patent. There is pulmonary embolism involving the right pulmonary artery extending into the lobar and segmental branches of the right lower lobe as well as extending into the right upper lobe. Left lower lobe subsegmental pulmonary embolus noted. No CT evidence of right heart straining. Mediastinum/Nodes: There is no hilar adenopathy. Upper mediastinal mass with splaying of the branches of the aortic arch with mass effect and mild displacement of the trachea to the right of the midline as seen on the prior CT and likely corresponds to the large goiter described on the prior ultrasound. Lungs/Pleura: The lungs are clear. There is no pleural effusion or pneumothorax. The central airways are patent. Upper Abdomen: Apparent fatty infiltration of the liver. Multiple stones noted within the gallbladder. No pericholecystic fluid. The visualized upper abdomen is otherwise unremarkable. Musculoskeletal: There is multilevel degenerative changes of the spine. No acute fracture. Review of the MIP images confirms the above findings. IMPRESSION: Positive for pulmonary artery embolus. No definite CT evidence of right cardiac strain. Correlation with clinical exam, EKG, and echocardiogram recommended. Upper mediastinal mass as seen on the prior CT likely related to large goiter. These results were called by telephone at the time of interpretation on 12/21/2016 at 10:40 pm to Dr. Zenovia Jarred ,  who verbally acknowledged these results. Electronically Signed   By: Anner Crete M.D.   On: 12/21/2016 22:44    Procedures Procedures (including critical care time)  Medications Ordered in ED Medications  iopamidol (ISOVUE-370) 76 % injection (100 mLs  Contrast Given 12/21/16 2208)     Initial Impression / Assessment and Plan / ED Course  I have reviewed the triage vital signs and the nursing notes.  Pertinent labs & imaging results that were available during my care of the patient were reviewed by me and considered in my medical decision making (see chart for details).    Patient is a 66 year old female presenting with shortness of breath and cough. Patient had recent intubation last week and discharged  after aspiration pneumonia. Finished two day abx at home. Patient's had increasing shortness of breath at home. Patient reportedly had some wheezing was given Dex and albuterol at urgent care and sent here for further evaluation. Patient x-ray  is relatively clear. Her vital signs are normal. We will get ambulatory pulse ox. We'll get d-dimer.   D Dimer +  CT shows PE. Trop neg, no refluc into IVC. No hypotension. Will start heparin, admit.   CRITICAL CARE Performed by: Gardiner Sleeper Total critical care time: 60 minutes Critical care time was exclusive of separately billable procedures and treating other patients. Critical care was necessary to treat or prevent imminent or life-threatening deterioration. Critical care was time spent personally by me on the following activities: development of treatment plan with patient and/or surrogate as well as nursing, discussions with consultants, evaluation of patient's response to treatment, examination of patient, obtaining history from patient or surrogate, ordering and performing treatments and interventions, ordering and review of laboratory studies, ordering and review of radiographic studies, pulse oximetry and re-evaluation of  patient's condition.   Final Clinical Impressions(s) / ED Diagnoses   Final diagnoses:  None    New Prescriptions New Prescriptions   No medications on file     Chun Sellen Julio Alm, MD 12/21/16 2255

## 2016-12-21 NOTE — Discharge Instructions (Signed)
I am concerned you may by having complications related to your hospitalization secondary to you being intubated and ventilated. Your lungs had significant rhonchi and wheezing prior to your DuoNeb treatment and your chest xray was significant for having bronchitic changes and right atelectasis. I am concerned given your history you may have a lung infection or other complication. I recommend you seek further treatment at the emergency room for further evaluation of your condition.

## 2016-12-22 ENCOUNTER — Encounter (HOSPITAL_COMMUNITY): Payer: Self-pay

## 2016-12-22 ENCOUNTER — Observation Stay (HOSPITAL_BASED_OUTPATIENT_CLINIC_OR_DEPARTMENT_OTHER): Payer: BLUE CROSS/BLUE SHIELD

## 2016-12-22 DIAGNOSIS — I2699 Other pulmonary embolism without acute cor pulmonale: Secondary | ICD-10-CM

## 2016-12-22 DIAGNOSIS — H409 Unspecified glaucoma: Secondary | ICD-10-CM

## 2016-12-22 DIAGNOSIS — M79604 Pain in right leg: Secondary | ICD-10-CM | POA: Diagnosis not present

## 2016-12-22 DIAGNOSIS — E049 Nontoxic goiter, unspecified: Secondary | ICD-10-CM | POA: Diagnosis not present

## 2016-12-22 DIAGNOSIS — E039 Hypothyroidism, unspecified: Secondary | ICD-10-CM

## 2016-12-22 LAB — ECHOCARDIOGRAM COMPLETE
Height: 61 in
Weight: 4046.4 oz

## 2016-12-22 LAB — CBC
HCT: 38.8 % (ref 36.0–46.0)
Hemoglobin: 12.4 g/dL (ref 12.0–15.0)
MCH: 26.3 pg (ref 26.0–34.0)
MCHC: 32 g/dL (ref 30.0–36.0)
MCV: 82.4 fL (ref 78.0–100.0)
PLATELETS: 244 10*3/uL (ref 150–400)
RBC: 4.71 MIL/uL (ref 3.87–5.11)
RDW: 13.9 % (ref 11.5–15.5)
WBC: 7.9 10*3/uL (ref 4.0–10.5)

## 2016-12-22 LAB — HEPARIN LEVEL (UNFRACTIONATED): Heparin Unfractionated: 0.67 IU/mL (ref 0.30–0.70)

## 2016-12-22 MED ORDER — TIMOLOL MALEATE PF 0.5 % OP SOLN: 1.0000 [drp] | Freq: Every morning | OPHTHALMIC | Status: DC

## 2016-12-22 MED ORDER — SODIUM CHLORIDE 0.9% FLUSH
3.0000 mL | Freq: Two times a day (BID) | INTRAVENOUS | Status: DC
  Administered 2016-12-22 – 2016-12-24 (×5): 3 mL via INTRAVENOUS

## 2016-12-22 MED ORDER — LATANOPROST 0.005 % OP SOLN
1.0000 [drp] | Freq: Every day | OPHTHALMIC | Status: DC
  Administered 2016-12-22 – 2016-12-23 (×3): 1 [drp] via OPHTHALMIC
  Filled 2016-12-22: qty 2.5

## 2016-12-22 MED ORDER — ACETAMINOPHEN 650 MG RE SUPP: 650.0000 mg | Freq: Four times a day (QID) | RECTAL | Status: DC | PRN

## 2016-12-22 MED ORDER — RIVAROXABAN (XARELTO) VTE STARTER PACK (15 & 20 MG): ORAL_TABLET | ORAL | 0 refills | Status: DC

## 2016-12-22 MED ORDER — TIMOLOL MALEATE 0.5 % OP SOLN
1.0000 [drp] | Freq: Every day | OPHTHALMIC | Status: DC
  Administered 2016-12-22 – 2016-12-24 (×3): 1 [drp] via OPHTHALMIC
  Filled 2016-12-22: qty 5

## 2016-12-22 MED ORDER — PERFLUTREN LIPID MICROSPHERE
1.0000 mL | INTRAVENOUS | Status: AC | PRN
  Administered 2016-12-22: 2 mL via INTRAVENOUS
  Filled 2016-12-22: qty 10

## 2016-12-22 MED ORDER — GABAPENTIN 300 MG PO CAPS
300.0000 mg | ORAL_CAPSULE | Freq: Every day | ORAL | Status: DC
  Administered 2016-12-22 – 2016-12-23 (×3): 300 mg via ORAL
  Filled 2016-12-22 (×3): qty 1

## 2016-12-22 MED ORDER — ACETAMINOPHEN 325 MG PO TABS: 650.0000 mg | ORAL_TABLET | Freq: Four times a day (QID) | ORAL | Status: DC | PRN

## 2016-12-22 MED ORDER — RIVAROXABAN 15 MG PO TABS
15.0000 mg | ORAL_TABLET | Freq: Two times a day (BID) | ORAL | Status: DC
  Administered 2016-12-22 – 2016-12-24 (×5): 15 mg via ORAL
  Filled 2016-12-22 (×5): qty 1

## 2016-12-22 MED ORDER — ONDANSETRON HCL 4 MG/2ML IJ SOLN: 4.0000 mg | Freq: Four times a day (QID) | INTRAMUSCULAR | Status: DC | PRN

## 2016-12-22 MED ORDER — ONDANSETRON HCL 4 MG PO TABS: 4.0000 mg | ORAL_TABLET | Freq: Four times a day (QID) | ORAL | Status: DC | PRN

## 2016-12-22 MED ORDER — DORZOLAMIDE HCL 2 % OP SOLN
1.0000 [drp] | Freq: Three times a day (TID) | OPHTHALMIC | Status: DC
  Administered 2016-12-22 – 2016-12-23 (×4): 1 [drp] via OPHTHALMIC

## 2016-12-22 MED ORDER — IPRATROPIUM-ALBUTEROL 0.5-2.5 (3) MG/3ML IN SOLN: 3.0000 mL | RESPIRATORY_TRACT | Status: DC | PRN

## 2016-12-22 MED ORDER — GABAPENTIN 600 MG PO TABS
600.0000 mg | ORAL_TABLET | Freq: Two times a day (BID) | ORAL | Status: DC
  Administered 2016-12-22 – 2016-12-24 (×5): 600 mg via ORAL
  Filled 2016-12-22 (×6): qty 1

## 2016-12-22 MED ORDER — GABAPENTIN 300 MG PO CAPS: 300.0000 mg | ORAL_CAPSULE | Freq: Two times a day (BID) | ORAL | Status: DC

## 2016-12-22 MED ORDER — RIVAROXABAN 20 MG PO TABS: 20.0000 mg | ORAL_TABLET | Freq: Every day | ORAL | Status: DC

## 2016-12-22 NOTE — Discharge Summary (Signed)
Physician Discharge Summary  Ariel Henry [id number] DOB: 01-09-1951 DOA: 12/21/2016  PCP: Benito Mccreedy, MD  Admit date: 12/21/2016 Discharge date: 12/24/2016  Time spent: 45 minutes  Recommendations for Outpatient Follow-up:  Patient will be discharged to home.  Patient will need to follow up with primary care provider within one week of discharge.  Patient should continue medications as prescribed.  Patient should follow a heart healthy diet.   Discharge Diagnoses:  Dyspnea secondary to Acute PE/DVT Thyroid disease Glaucoma Recent admission for aspiration pneumonia  Discharge Condition: Stable  Diet recommendation: heart healthy  Filed Weights   12/21/16 1532 12/22/16 0109 12/24/16 0652  Weight: 113.9 kg (251 lb) 114.7 kg (252 lb 14.4 oz) 117.2 kg (258 lb 6.4 oz)    History of present illness:  ON 12/22/2016 by Dr. Lily Kocher Ariel Henry is a 66 y.o. woman with a history of thyroid disease (currently "being monitored"), goiter, glaucoma, and osteoarthritis who was recently admitted from 1/19 - 1/22 after requiring intubation for acute respiratory failure after choking on a mucinex tablet then aspirating.  She received IV antibiotics for possible aspiration pneumonia.  She completed a course of oral Augmentin post discharge.  She presents to the ED today because she has had progressive shortness of breath/DOE and orthopnea for the past two days.  She has had increased pain in her right calf.  Mild swelling if present.  She has had a right sided, pleuritic chest pain, that is described as sharp and stabbing.  No syncope.  She has a new oxygen requirement.  Hospital Course:  Dyspnea secondary to Acute PE/RLE DVT -CTA Chest: +PE -Started on IV heparin, transitioned to Xarelto -LE doppler: Acute DVT right posterior tibial, popliteal, distal femoral veins -Echocardiogram: Normal LV systolic function, grade 1 diastolic dysfunction, severely reduced RV systolic  function -Patient will need to follow up with her PCP or hematologist for coag studies -Patient ambulated, oxygen sats maintained in the mid 90s -Vital signs have remained stable -Given results of echo, PCCM consulted and appreciated. Recommended continued observation and cycle troponins- concern for ?RV infarct from PE.  Troponins remained negative.  -PT consulted, no needs -Case management consulted for Xarelto coverage  Thyroid disease -Currently on no home medications -TSH 1.281 -Continue to follow up with PCP  Glaucoma -Continue home medications  Recent admission for aspiration pneumonia -Require intubation -Appears to be stable  Morbid obesity -BMI 47.9 -patient will need to follow up with her PCP to discuss lifestyle modifications  Consultants PCCM  Procedures  Echocardiogram LE doppler  Discharge Exam: Vitals:   12/24/16 0652 12/24/16 0827  BP: 115/63 122/66  Pulse: 68 67  Resp: 18 18  Temp: 97.9 F (36.6 C) 98.3 F (36.8 C)   Patient feels her breathing has improved. Denies chest pain, abdominal pain, N/V/D/C.     General: Well developed, well nourished, NAD, appears stated age  HEENT: NCAT, mucous membranes moist.  Cardiovascular: S1 S2 auscultated, no murmurs, RRR  Respiratory: Clear to auscultation bilaterally with equal chest rise  Abdomen: Soft, obese, nontender, nondistended, + bowel sounds  Extremities: warm dry without cyanosis clubbing. RLE edema.   Neuro: AAOx3, nonfocal  Psych: Normal affect and demeanor with intact judgement and insight  Discharge Instructions Discharge Instructions    Discharge instructions    Complete by:  As directed    Patient will be discharged to home.  Patient will need to follow up with primary care provider within one week of discharge.  Patient should  continue medications as prescribed.  Patient should follow a heart healthy diet.     Current Discharge Medication List    START taking these  medications   Details  Rivaroxaban 15 & 20 MG TBPK Take as directed on package: Start with one 15mg  tablet by mouth twice a day with food. On Day 22, switch to one 20mg  tablet once a day with food. Qty: 51 each, Refills: 0      CONTINUE these medications which have NOT CHANGED   Details  albuterol (PROVENTIL HFA;VENTOLIN HFA) 108 (90 BASE) MCG/ACT inhaler Inhale 1-2 puffs into the lungs every 6 (six) hours as needed for wheezing. Qty: 1 Inhaler, Refills: 0    cholecalciferol (VITAMIN D) 1000 units tablet Take 1,000 Units by mouth daily.    dorzolamide (TRUSOPT) 2 % ophthalmic solution 1 drop 3 (three) times daily.    !! gabapentin (NEURONTIN) 300 MG capsule Take 300 mg by mouth at bedtime.    !! gabapentin (NEURONTIN) 300 MG capsule Take 300 mg by mouth 2 (two) times daily. 600mg  in morning and afternoon, 300mg  at bedtime     latanoprost (XALATAN) 0.005 % ophthalmic solution Place 1 drop into both eyes at bedtime.    Polyethyl Glycol-Propyl Glycol (SYSTANE ULTRA OP) Apply to eye.    pyridOXINE (VITAMIN B-6) 100 MG tablet Take 100 mg by mouth daily.    timolol (TIMOPTIC) 0.5 % ophthalmic solution 1 drop 2 (two) times daily.    Timolol Maleate PF 0.5 % SOLN Apply 1 drop to eye every morning.    hydrOXYzine (ATARAX/VISTARIL) 25 MG tablet Take 25 mg by mouth 4 (four) times daily as needed for anxiety.     !! - Potential duplicate medications found. Please discuss with provider.    STOP taking these medications     aspirin EC 81 MG tablet        No Known Allergies Follow-up Information    OSEI-BONSU,GEORGE, MD. Schedule an appointment as soon as possible for a visit in 1 week(s).   Specialty:  Internal Medicine Why:  Hospital follow up Contact information: [place] [address] [phone]            The results of significant diagnostics from this hospitalization (including imaging, microbiology, ancillary and laboratory) are listed below for  reference.    Significant Diagnostic Studies: Dg Chest 2 View  Result Date: 12/21/2016 CLINICAL DATA:  Cough and shortness of breath for 2 weeks, audible rhonchi and wheezing, recent hospitalization with intubation EXAM: CHEST  2 VIEW COMPARISON:  10/05/2013, correlation CT chest 02/16/2016 FINDINGS: Enlargement of cardiac silhouette. Tortuous aorta with atherosclerotic calcification. Enlargement of superior mediastinum with tracheal deviation to the RIGHT corresponding to large superior mediastinal soft tissue mass identified on prior CT exam, by ultrasound felt to likely represent a large goiter. Eventration RIGHT diaphragm. Bronchitic changes with RIGHT basilar atelectasis. No definite infiltrate, pleural effusion or pneumothorax. IMPRESSION: Superior mediastinal mass/mass effect corresponding to soft tissue mass identified on prior CT, felt to be a goiter by prior thyroid ultrasound. Aortic atherosclerosis. Bronchitic changes with RIGHT basilar atelectasis. Electronically Signed   By: Lavonia Dana M.D.   On: 12/21/2016 14:50   Ct Head Wo Contrast  Result Date: 12/14/2016 CLINICAL DATA:  Unresponsive, low O2 sats.  Intubated. EXAM: CT HEAD WITHOUT CONTRAST TECHNIQUE: Contiguous axial images were obtained from the base of the skull through the vertex without intravenous contrast. COMPARISON:  None. FINDINGS: Brain: Ventricles are normal in size and configuration.  All areas of the brain demonstrate grossly normal gray-white matter attenuation (vague low-density areas within the cerebellum are felt to be artifactual caused by beam hardening artifact). There is no mass, hemorrhage, edema or other evidence of acute parenchymal abnormality. No extra-axial hemorrhage. Vascular: There are chronic calcified atherosclerotic changes of the large vessels at the skull base. No unexpected hyperdense vessel. Skull: Normal. Negative for fracture or focal lesion. Sinuses/Orbits: No acute finding. Other: None. IMPRESSION:  Negative head CT. No intracranial mass, hemorrhage or edema appreciated. Electronically Signed   By: Franki Cabot M.D.   On: 12/14/2016 15:47   Ct Angio Chest Pe W And/or Wo Contrast  Result Date: 12/21/2016 CLINICAL DATA:  66 year old female with elevated D-dimer presenting with shortness of breath. Recent hospitalization. EXAM: CT ANGIOGRAPHY CHEST WITH CONTRAST TECHNIQUE: Multidetector CT imaging of the chest was performed using the standard protocol during bolus administration of intravenous contrast. Multiplanar CT image reconstructions and MIPs were obtained to evaluate the vascular anatomy. CONTRAST:  100 cc Isovue 370 COMPARISON:  Chest radiograph 12/21/2016 and chest CT dated 02/16/2016 FINDINGS: Cardiovascular: There is mild cardiomegaly with mild enlargement of the left ventricle. No pericardial effusion. The thoracic aorta is unremarkable. The origins of the great vessels of the aortic arch appear patent. There is pulmonary embolism involving the right pulmonary artery extending into the lobar and segmental branches of the right lower lobe as well as extending into the right upper lobe. Left lower lobe subsegmental pulmonary embolus noted. No CT evidence of right heart straining. Mediastinum/Nodes: There is no hilar adenopathy. Upper mediastinal mass with splaying of the branches of the aortic arch with mass effect and mild displacement of the trachea to the right of the midline as seen on the prior CT and likely corresponds to the large goiter described on the prior ultrasound. Lungs/Pleura: The lungs are clear. There is no pleural effusion or pneumothorax. The central airways are patent. Upper Abdomen: Apparent fatty infiltration of the liver. Multiple stones noted within the gallbladder. No pericholecystic fluid. The visualized upper abdomen is otherwise unremarkable. Musculoskeletal: There is multilevel degenerative changes of the spine. No acute fracture. Review of the MIP images confirms the  above findings. IMPRESSION: Positive for pulmonary artery embolus. No definite CT evidence of right cardiac strain. Correlation with clinical exam, EKG, and echocardiogram recommended. Upper mediastinal mass as seen on the prior CT likely related to large goiter. These results were called by telephone at the time of interpretation on 12/21/2016 at 10:40 pm to Dr. Zenovia Jarred , who verbally acknowledged these results. Electronically Signed   By: Anner Crete M.D.   On: 12/21/2016 22:44   Dg Chest Port 1 View  Result Date: 12/15/2016 CLINICAL DATA:  Ventilation dependent EXAM: PORTABLE CHEST 1 VIEW COMPARISON:  December 14, 2016 FINDINGS: Stable ET tube in good position. No pneumothorax. Low lung volumes. The opacity in the right base has improved in the interval, likely representing atelectasis on the previous study. No focal infiltrates seen today. No acute abnormality. IMPRESSION: No acute abnormality.  Stable ET tube. Electronically Signed   By: Dorise Bullion III M.D   On: 12/15/2016 07:12   Dg Chest Portable 1 View  Result Date: 12/14/2016 CLINICAL DATA:  Intubated EXAM: PORTABLE CHEST 1 VIEW COMPARISON:  None. FINDINGS: Low lung volumes. Endotracheal tube tip is 3.6 cm above the carina. Top-normal heart size. Grossly normal mediastinal contour. No pneumothorax. No pleural effusion. There are hazy opacities in the mid to lower lungs bilaterally. IMPRESSION: 1.  Well-positioned endotracheal tube. 2. Low lung volumes. Hazy opacities in the mid to lower lungs bilaterally, favor atelectasis, cannot exclude a component of aspiration or pneumonia. Electronically Signed   By: Ilona Sorrel M.D.   On: 12/14/2016 15:11    Microbiology: Recent Results (from the past 240 hour(s))  Blood Culture (routine x 2)     Status: None   Collection Time: 12/14/16  3:40 PM  Result Value Ref Range Status   Specimen Description BLOOD LEFT ARM  Final   Special Requests BOTTLES DRAWN AEROBIC AND ANAEROBIC 5CC   Final   Culture NO GROWTH 5 DAYS  Final   Report Status 12/19/2016 FINAL  Final  Blood Culture (routine x 2)     Status: None   Collection Time: 12/14/16  3:54 PM  Result Value Ref Range Status   Specimen Description BLOOD RIGHT ARM  Final   Special Requests BOTTLES DRAWN AEROBIC AND ANAEROBIC 5CC  Final   Culture NO GROWTH 5 DAYS  Final   Report Status 12/19/2016 FINAL  Final  MRSA PCR Screening     Status: None   Collection Time: 12/14/16  8:55 PM  Result Value Ref Range Status   MRSA by PCR NEGATIVE NEGATIVE Final    Comment:        The GeneXpert MRSA Assay (FDA approved for NASAL specimens only), is one component of a comprehensive MRSA colonization surveillance program. It is not intended to diagnose MRSA infection nor to guide or monitor treatment for MRSA infections.   Urine culture     Status: None   Collection Time: 12/14/16 10:00 PM  Result Value Ref Range Status   Specimen Description URINE, RANDOM  Final   Special Requests NONE  Final   Culture NO GROWTH  Final   Report Status 12/16/2016 FINAL  Final     Labs: Basic Metabolic Panel:  Recent Labs Lab 12/21/16 1539 12/23/16 0504 12/24/16 0017  NA 141 140 140  K 3.8 3.9 4.0  CL 107 106 105  CO2 25 26 29   GLUCOSE 78 89 106*  BUN 11 14 15   CREATININE 0.60 0.66 0.72  CALCIUM 9.3 9.0 8.8*   Liver Function Tests:  Recent Labs Lab 12/21/16 1539  AST 16  ALT 12*  ALKPHOS 59  BILITOT 0.7  PROT 7.0  ALBUMIN 3.6   No results for input(s): LIPASE, AMYLASE in the last 168 hours. No results for input(s): AMMONIA in the last 168 hours. CBC:  Recent Labs Lab 12/21/16 1539 12/22/16 0409 12/23/16 0504 12/24/16 0017  WBC 6.5 7.9 7.7 6.9  NEUTROABS 4.7  --   --   --   HGB 12.2 12.4 11.3* 11.1*  HCT 38.8 38.8 35.0* 35.2*  MCV 83.4 82.4 82.7 83.0  PLT 200 244 239 264   Cardiac Enzymes:  Recent Labs Lab 12/23/16 1036 12/23/16 1757 12/24/16 0017  TROPONINI 0.03* <0.03 <0.03   BNP: BNP (last  3 results)  Recent Labs  12/14/16 1443 12/14/16 1825 12/23/16 1057  BNP 25.4 16.4 13.6    ProBNP (last 3 results) No results for input(s): PROBNP in the last 8760 hours.  CBG: No results for input(s): GLUCAP in the last 168 hours.     SignedCristal Ford  [facility] 12/24/2016, 11:37 AM

## 2016-12-22 NOTE — Progress Notes (Signed)
PT Cancellation Note  Patient Details Name: NOELEEN FAW MRN: [id number] DOB: 19-Oct-1951   Cancelled Treatment:    Reason Eval/Treat Not Completed: Medical issues which prohibited therapy. Pt with acute DVT. PT to proceed with eval when medically appropriate.   Lorriane Shire 12/22/2016, 1:05 PM

## 2016-12-22 NOTE — Discharge Instructions (Signed)
Pulmonary Embolism A pulmonary embolism (PE) is a sudden blockage or decrease of blood flow in one lung or both lungs. Most blockages come from a blood clot that travels from the legs or the pelvis to the lungs. PE is a dangerous and potentially life-threatening condition if it is not treated right away. What are the causes? A pulmonary embolism occurs most commonly when a blood clot travels from one of your veins to your lungs. Rarely, PE is caused by air, fat, amniotic fluid, or part of a tumor traveling through your veins to your lungs. What increases the risk? A PE is more likely to develop in:  People who smoke.  People who areolder, especially over 31 years of age.  People who are overweight (obese).  People who sit or lie still for a long time, such as during long-distance travel (over 4 hours), bed rest, hospitalization, or during recovery from certain medical conditions like a stroke.  People who do not engage in much physical activity (sedentary lifestyle).  People who have chronic breathing disorders.  People whohave a personal or family history of blood clots or blood clotting disease.  People whohave peripheral vascular disease (PVD), diabetes, or some types of cancer.  People who haveheart disease, especially if the person had a recent heart attack or has congestive heart failure.  People who have neurological diseases that affect the legs (leg paresis).  People who have had a traumatic injury, such as breaking a hip or leg.  People whohave recently had major or lengthy surgery, especially on the hip, knee, or abdomen.  People who have hada central line placed inside a large vein.  People who takemedicines that contain the hormone estrogen. These include birth control pills and hormone replacement therapy.  Pregnancy or during childbirth or the postpartum period. What are the signs or symptoms? The symptoms of a PE usually start suddenly and  include:  Shortness of breath while active or at rest.  Coughing or coughing up blood or blood-tinged mucus.  Chest pain that is often worse with deep breaths.  Rapid or irregular heartbeat.  Feeling light-headed or dizzy.  Fainting.  Feelinganxious.  Sweating. There may also be pain and swelling in a leg if that is where the blood clot started. These symptoms may represent a serious problem that is an emergency. Do not wait to see if the symptoms will go away. Get medical help right away. Call your local emergency services (911 in the [place] Do not drive yourself to the hospital.  How is this diagnosed? Your health care provider will take a medical history and perform a physical exam. You may also have other tests, including:  Blood tests to assess the clotting properties of your blood, assess oxygen levels in your blood, and find blood clots.  Imaging tests, such as CT, ultrasound, MRI, X-ray, and other tests to see if you have clots anywhere in your body.  An electrocardiogram (ECG) to look for heart strain from blood clots in the lungs. How is this treated? The main goals of PE treatment are:  To stop a blood clot from growing larger.  To stop new blood clots from forming. The type of treatment that you receive depends on many factors, such as the cause of your PE, your risk for bleeding or developing more clots, and other medical conditions that you have. Sometimes, a combination of treatments is necessary. This condition may be treated with:  Medicines, including newer oral blood thinners (anticoagulants), warfarin, low  molecular weight heparins, thrombolytics, or heparins.  Wearing compression stockings or using different types of devices.  Surgery (rare) to remove the blood clot or to place a filter in your abdomen to stop the blood clot from traveling to your lungs. Treatments for a PE are often divided into immediate treatment, long-term treatment (up to 3 months  after PE), and extended treatment (more than 3 months after PE). Your treatment may continue for several months. This is called maintenance therapy, and it is used to prevent the forming of new blood clots. You can work with your health care provider to choose the treatment program that is best for you. What are anticoagulants?  Anticoagulants are medicines that treat PEs. They can stop current blood clots from growing and stop new clots from forming. They cannot dissolve existing clots. Your body dissolves clots by itself over time. Anticoagulants are given by mouth, by injection, or through an IV tube. What are thrombolytics?  Thrombolytics are clot-dissolving medicines that are used to dissolve a PE. They carry a high risk of bleeding, so they tend to be used only in severe cases or if you have very low blood pressure. Follow these instructions at home: If you are taking a newer oral anticoagulant:  Take the medicine every single day at the same time each day.  Understand what foods and drugs interact with this medicine.  Understand that there are no regular blood tests required when using this medicine.  Understandthe side effects of this medicine, including excessive bruising or bleeding. Ask your health care provider or pharmacist about other possible side effects. If you are taking warfarin:  Understand how to take warfarin and know which foods can affect how warfarin works in Veterinary surgeon.  Understand that it is dangerous to taketoo much or too little warfarin. Too much warfarin increases the risk of bleeding. Too little warfarin continues to allow the risk for blood clots.  Follow your PT and INR blood testing schedule. The PT and INR results allow your health care provider to adjust your dose of warfarin. It is very important that you have your PT and INR tested as often as told by your health care provider.  Avoid major changes in your diet, or tell your health care provider before  you change your diet. Arrange a visit with a registered dietitian to answer your questions. Many foods, especially foods that are high in vitamin K, can interfere with warfarin and affect the PT and INR results. Eat a consistent amount of foods that are high in vitamin K, such as:  Spinach, kale, broccoli, cabbage, collard greens, turnip greens, Brussels sprouts, peas, cauliflower, seaweed, and parsley.  Beef liver and pork liver.  Green tea.  Soybean oil.  Tell your health care provider about any and all medicines, vitamins, and supplements that you take, including aspirin and other over-the-counter anti-inflammatory medicines. Be especially cautious with aspirin and anti-inflammatory medicines. Do not take those before you ask your health care provider if it is safe to do so. This is important because many medicines can interfere with warfarin and affect the PT and INR results.  Do not start or stop taking any over-the-counter or prescription medicine unless your health care provider or pharmacist tells you to do so. If you take warfarin, you will also need to do these things:  Hold pressure over cuts for longer than usual.  Tell your dentist and other health care providers that you are taking warfarin before you have any procedures  in which bleeding may occur.  Avoid alcohol or drink very small amounts. Tell your health care provider if you change your alcohol intake.  Do not use tobacco products, including cigarettes, chewing tobacco, and e-cigarettes. If you need help quitting, ask your health care provider.  Avoid contact sports. General instructions  Take over-the-counter and prescription medicines only as told by your health care provider. Anticoagulant medicines can have side effects, including easy bruising and difficulty stopping bleeding. If you are prescribed an anticoagulant, you will also need to do these things:  Hold pressure over cuts for longer than usual.  Tell your  dentist and other health care providers that you are taking anticoagulants before you have any procedures in which bleeding may occur.  Avoid contact sports.  Wear a medical alert bracelet or carry a medical alert card that says you have had a PE.  Ask your health care provider how soon you can go back to your normal activities. Stay active to prevent new blood clots from forming.  Make sure to exercise while traveling or when you have been sitting or standing for a long period of time. It is very important to exercise. Exercise your legs by walking or by tightening and relaxing your leg muscles often. Take frequent walks.  Wear compression stockings as told by your health care provider to help prevent more blood clots from forming.  Do not use tobacco products, including cigarettes, chewing tobacco, and e-cigarettes. If you need help quitting, ask your health care provider.  Keep all follow-up appointments with your health care provider. This is important. How is this prevented? Take these actions to decrease your risk of developing another PE:  Exercise regularly. For at least 30 minutes every day, engage in:  Activity that involves moving your arms and legs.  Activity that encourages good blood flow through your body by increasing your heart rate.  Exercise your arms and legs every hour during long-distance travel (over 4 hours). Drink plenty of water and avoid drinking alcohol while traveling.  Avoid sitting or lying in bed for long periods of time without moving your legs.  Maintain a weight that is appropriate for your height. Ask your health care provider what weight is healthy for you.  If you are a woman who is over 39 years of age, avoid unnecessary use of medicines that contain estrogen. These include birth control pills.  Do not smoke, especially if you take estrogen medicines. If you need help quitting, ask your health care provider.  If you are at very high risk for  PE, wear compression stockings.  If you recently had a PE, have regularly scheduled ultrasound testing on your legs to check for new blood clots. If you are hospitalized, prevention measures may include:  Early walking after surgery, as soon as your health care provider says that it is safe.  Receiving anticoagulants to prevent blood clots. If you cannot take anticoagulants, other options may be available, such as wearing compression stockings or using different types of devices. Get help right away if:  You have new or increased pain, swelling, or redness in an arm or leg.  You have numbness or tingling in an arm or leg.  You have shortness of breath while active or at rest.  You have chest pain.  You have a rapid or irregular heartbeat.  You feel light-headed or dizzy.  You cough up blood.  You notice blood in your vomit, bowel movement, or urine.  You have a  fever. These symptoms may represent a serious problem that is an emergency. Do not wait to see if the symptoms will go away. Get medical help right away. Call your local emergency services (911 in the [place] Do not drive yourself to the hospital.  This information is not intended to replace advice given to you by your health care provider. Make sure you discuss any questions you have with your health care provider. Document Released: 11/09/2000 Document Revised: 04/19/2016 Document Reviewed: 03/09/2015 Elsevier Interactive Patient Education  2017 [place] on my medicine - XARELTO (rivaroxaban)  This medication education was reviewed with me or my healthcare representative as part of my discharge preparation.  The pharmacist that spoke with me during my hospital stay was:  Einar Grad, [facility]  WHY WAS [place]? Xarelto was prescribed to treat blood clots that may have been found in the veins of your legs (deep vein thrombosis) or in your lungs (pulmonary embolism) and to reduce the  risk of them occurring again.  What do you need to know about Xarelto? The starting dose is one 15 mg tablet taken TWICE daily with food for the FIRST 21 DAYS then on (enter date)  01/12/17  the dose is changed to one 20 mg tablet taken ONCE A DAY with your evening meal.  DO NOT stop taking Xarelto without talking to the health care provider who prescribed the medication.  Refill your prescription for 20 mg tablets before you run out.  After discharge, you should have regular check-up appointments with your healthcare provider that is prescribing your Xarelto.  In the future your dose may need to be changed if your kidney function changes by a significant amount.  What do you do if you miss a dose? If you are taking Xarelto TWICE DAILY and you miss a dose, take it as soon as you remember. You may take two 15 mg tablets (total 30 mg) at the same time then resume your regularly scheduled 15 mg twice daily the next day.  If you are taking Xarelto ONCE DAILY and you miss a dose, take it as soon as you remember on the same day then continue your regularly scheduled once daily regimen the next day. Do not take two doses of Xarelto at the same time.   Important Safety Information Xarelto is a blood thinner medicine that can cause bleeding. You should call your healthcare provider right away if you experience any of the following: ? Bleeding from an injury or your nose that does not stop. ? Unusual colored urine (red or dark brown) or unusual colored stools (red or black). ? Unusual bruising for unknown reasons. ? A serious fall or if you hit your head (even if there is no bleeding).  Some medicines may interact with Xarelto and might increase your risk of bleeding while on Xarelto. To help avoid this, consult your healthcare provider or pharmacist prior to using any new prescription or non-prescription medications, including herbals, vitamins, non-steroidal anti-inflammatory drugs (NSAIDs) and  supplements.  This website has more information on Xarelto: [email or web address]

## 2016-12-22 NOTE — ED Notes (Signed)
Placed pt on 2L O2 via [place] for supportive measures.

## 2016-12-22 NOTE — Progress Notes (Signed)
Received report from [place], Therapist, sports. Made aware 2D echo results available along with bilat lower extremities.

## 2016-12-22 NOTE — ED Notes (Signed)
Pt made aware of bed assignment 

## 2016-12-22 NOTE — H&P (Signed)
History and Physical    Ariel Henry [id number] DOB: [birth year] DOA: 12/21/2016  PCP: Benito Mccreedy, MD   Patient coming from: Home  Chief Complaint: Shortness of breath, orthopnea  HPI: Ariel Henry is a 66 y.o. woman with a history of thyroid disease (currently "being monitored"), goiter, glaucoma, and osteoarthritis who was recently admitted from 1/19 - 1/22 after requiring intubation for acute respiratory failure after choking on a mucinex tablet then aspirating.  She received IV antibiotics for possible aspiration pneumonia.  She completed a course of oral Augmentin post discharge.  She presents to the ED today because she has had progressive shortness of breath/DOE and orthopnea for the past two days.  She has had increased pain in her right calf.  Mild swelling if present.  She has had a right sided, pleuritic chest pain, that is described as sharp and stabbing.  No syncope.  She has a new oxygen requirement.  ED Course: O2 sats in the low 90's on 2L [place].  No significant tachycardia.  D-Dimer markedly positive (> 20).  CTA chest shows acute PE involving the right pulmonary artery extending into the lobar and segmental branches of the RUL and RLL.  She also has involvement of the LLL subsegmental pulmonary arteries.  No CT evidence of right heart strain.  Super mediastinal mass thought to be goiter.  She has been started on IV heparin infusion per protocol.  Hospitalist asked to admit.  Review of Systems: As per HPI otherwise 10 point review of systems negative.    Past Medical History:  Diagnosis Date  . Arthritis   . Glaucoma   . Neuromuscular disorder (HCC)    carpal tunnel both wrists  . Pneumonia   . Thyroid disease     Past Surgical History:  Procedure Laterality Date  . ABDOMINAL HYSTERECTOMY    . bladder tack    . COLONOSCOPY WITH PROPOFOL N/A 09/04/2016   Procedure: COLONOSCOPY WITH PROPOFOL;  Surgeon: Mauri Pole, MD;  Location: [facility];   Service: Endoscopy;  Laterality: N/A;  . EYE SURGERY     bilateral cataract extraction     reports that she has never smoked. She has never used smokeless tobacco. She reports that she does not drink alcohol or use drugs. She is a widow.  She has three sons.  No Known Allergies  Family History  Problem Relation Age of Onset  . Colon cancer Neg Hx   She reports that her half-sister has a history of a blood clot, but she denies any known hereditary clotting disorders.  Her mother is deceased; she had CHF and DM.  One brother died of complications related to cancer; type unknown.   Prior to Admission medications   Medication Sig Start Date End Date Taking? Authorizing Provider  albuterol (PROVENTIL HFA;VENTOLIN HFA) 108 (90 BASE) MCG/ACT inhaler Inhale 1-2 puffs into the lungs every 6 (six) hours as needed for wheezing. 07/12/13  Yes Fredia Sorrow, MD  aspirin EC 81 MG tablet Take 81 mg by mouth daily.   Yes Historical Provider, MD  cholecalciferol (VITAMIN D) 1000 units tablet Take 1,000 Units by mouth daily.   Yes Historical Provider, MD  dorzolamide (TRUSOPT) 2 % ophthalmic solution 1 drop 3 (three) times daily.   Yes Historical Provider, MD  gabapentin (NEURONTIN) 300 MG capsule Take 300 mg by mouth at bedtime.   Yes Historical Provider, MD  gabapentin (NEURONTIN) 300 MG capsule Take 300 mg by mouth 2 (two) times daily. 600mg  in  morning and afternoon, 300mg  at bedtime    Yes Historical Provider, MD  latanoprost (XALATAN) 0.005 % ophthalmic solution Place 1 drop into both eyes at bedtime.   Yes Historical Provider, MD  Polyethyl Glycol-Propyl Glycol (SYSTANE ULTRA OP) Apply to eye.   Yes Historical Provider, MD  pyridOXINE (VITAMIN B-6) 100 MG tablet Take 100 mg by mouth daily.   Yes Historical Provider, MD  timolol (TIMOPTIC) 0.5 % ophthalmic solution 1 drop 2 (two) times daily.   Yes Historical Provider, MD  Timolol Maleate PF 0.5 % SOLN Apply 1 drop to eye every morning.   Yes  Historical Provider, MD  hydrOXYzine (ATARAX/VISTARIL) 25 MG tablet Take 25 mg by mouth 4 (four) times daily as needed for anxiety.    Historical Provider, MD    Physical Exam: Vitals:   12/22/16 0000 12/22/16 0012 12/22/16 0015 12/22/16 0109  BP: 121/73 121/73 129/76 (!) 132/91  Pulse: 69 69 72 72  Resp: 13 22  20   Temp:    97.5 F (36.4 C)  TempSrc:    Oral  SpO2: 93% 99% 95% 100%  Weight:    114.7 kg (252 lb 14.4 oz)  Height:    5\' 1"  (1.549 m)      Constitutional: NAD, calm, comfortable, sitting on the edge of the bed Vitals:   12/22/16 0000 12/22/16 0012 12/22/16 0015 12/22/16 0109  BP: 121/73 121/73 129/76 (!) 132/91  Pulse: 69 69 72 72  Resp: 13 22  20   Temp:    97.5 F (36.4 C)  TempSrc:    Oral  SpO2: 93% 99% 95% 100%  Weight:    114.7 kg (252 lb 14.4 oz)  Height:    5\' 1"  (1.549 m)   Eyes: PERRL, lids and conjunctivae normal ENMT: Mucous membranes are moist. Posterior pharynx clear of any exudate or lesions. Normal dentition.  Neck: normal appearance, thick but supple Respiratory: clear to auscultation bilaterally, no wheezing, no crackles. Normal respiratory effort. No accessory muscle use.  Cardiovascular: Normal rate, regular rhythm, no murmurs / rubs / gallops. Edema in right leg.  2+ pedal pulses.   GI: abdomen is obese but soft and compressible.  No distention.  No tenderness.  Bowel sounds are present. Musculoskeletal:  No joint deformity in upper and lower extremities. Good ROM, no contractures. Normal muscle tone.  Skin: no rashes, warm and dry Neurologic: No focal deficits. Psychiatric: Normal judgment and insight. Alert and oriented x 3. Normal mood.     Labs on Admission: I have personally reviewed following labs and imaging studies  CBC:  Recent Labs Lab 12/15/16 0328 12/16/16 0209 12/17/16 0544 12/21/16 1539  WBC 8.3 7.5 8.1 6.5  NEUTROABS  --  5.1 5.4 4.7  HGB 12.9 13.1 12.9 12.2  HCT 41.2 41.0 40.4 38.8  MCV 83.2 82.3 82.6 83.4    PLT 256 232 180 [id number]   Basic Metabolic Panel:  Recent Labs Lab 12/15/16 0328 12/16/16 0209 12/17/16 0544 12/21/16 1539  NA 141 141 138 141  K 3.5 3.4* 3.7 3.8  CL 106 104 102 107  CO2 27 29 28 25   GLUCOSE 90 102* 106* 78  BUN 15 15 14 11   CREATININE 0.79 0.61 0.65 0.60  CALCIUM 9.1 9.1 9.0 9.3  MG 2.0 2.0 2.2  --   PHOS 2.3* 3.8 4.4  --    GFR: Estimated Creatinine Clearance: 84.8 mL/min (by C-G formula based on SCr of 0.6 mg/dL). Liver Function Tests:  Recent Labs Lab 12/17/16  0544 12/21/16 1539  AST 17 16  ALT 9* 12*  ALKPHOS 65 59  BILITOT 1.3* 0.7  PROT 7.0 7.0  ALBUMIN 3.6 3.6   Cardiac Enzymes: Negative troponin  Radiological Exams on Admission: Dg Chest 2 View  Result Date: 12/21/2016 CLINICAL DATA:  Cough and shortness of breath for 2 weeks, audible rhonchi and wheezing, recent hospitalization with intubation EXAM: CHEST  2 VIEW COMPARISON:  10/05/2013, correlation CT chest 02/16/2016 FINDINGS: Enlargement of cardiac silhouette. Tortuous aorta with atherosclerotic calcification. Enlargement of superior mediastinum with tracheal deviation to the RIGHT corresponding to large superior mediastinal soft tissue mass identified on prior CT exam, by ultrasound felt to likely represent a large goiter. Eventration RIGHT diaphragm. Bronchitic changes with RIGHT basilar atelectasis. No definite infiltrate, pleural effusion or pneumothorax. IMPRESSION: Superior mediastinal mass/mass effect corresponding to soft tissue mass identified on prior CT, felt to be a goiter by prior thyroid ultrasound. Aortic atherosclerosis. Bronchitic changes with RIGHT basilar atelectasis. Electronically Signed   By: Lavonia Dana M.D.   On: 12/21/2016 14:50   Ct Angio Chest Pe W And/or Wo Contrast  Result Date: 12/21/2016 CLINICAL DATA:  66 year old female with elevated D-dimer presenting with shortness of breath. Recent hospitalization. EXAM: CT ANGIOGRAPHY CHEST WITH CONTRAST TECHNIQUE:  Multidetector CT imaging of the chest was performed using the standard protocol during bolus administration of intravenous contrast. Multiplanar CT image reconstructions and MIPs were obtained to evaluate the vascular anatomy. CONTRAST:  100 cc Isovue 370 COMPARISON:  Chest radiograph 12/21/2016 and chest CT dated 02/16/2016 FINDINGS: Cardiovascular: There is mild cardiomegaly with mild enlargement of the left ventricle. No pericardial effusion. The thoracic aorta is unremarkable. The origins of the great vessels of the aortic arch appear patent. There is pulmonary embolism involving the right pulmonary artery extending into the lobar and segmental branches of the right lower lobe as well as extending into the right upper lobe. Left lower lobe subsegmental pulmonary embolus noted. No CT evidence of right heart straining. Mediastinum/Nodes: There is no hilar adenopathy. Upper mediastinal mass with splaying of the branches of the aortic arch with mass effect and mild displacement of the trachea to the right of the midline as seen on the prior CT and likely corresponds to the large goiter described on the prior ultrasound. Lungs/Pleura: The lungs are clear. There is no pleural effusion or pneumothorax. The central airways are patent. Upper Abdomen: Apparent fatty infiltration of the liver. Multiple stones noted within the gallbladder. No pericholecystic fluid. The visualized upper abdomen is otherwise unremarkable. Musculoskeletal: There is multilevel degenerative changes of the spine. No acute fracture. Review of the MIP images confirms the above findings. IMPRESSION: Positive for pulmonary artery embolus. No definite CT evidence of right cardiac strain. Correlation with clinical exam, EKG, and echocardiogram recommended. Upper mediastinal mass as seen on the prior CT likely related to large goiter. These results were called by telephone at the time of interpretation on 12/21/2016 at 10:40 pm to Dr. Zenovia Jarred ,  who verbally acknowledged these results. Electronically Signed   By: Anner Crete M.D.   On: 12/21/2016 22:44    EKG: Independently reviewed. NSR.  Q waves in the inferior leads.  Assessment/Plan Principal Problem:   Pulmonary embolism (HCC) Active Problems:   Right leg pain   Glaucoma   Hypothyroidism      Acute PE --Agree with IV heparin for now --Hold baby aspirin --She appears to be candidate for Xarelto --Check echo for right heart strain --Check RLE venous doppler  for residual clot burden --Document ambulatory O2 sat on RA in AM to see if she will need home oxygen --Outpatient hematology referral  Glaucoma --Continue home eye drops  Thyroid disease --no active treatment   DVT prophylaxis: Anticoagulated with IV heparin infusion Code Status: FULL Family Communication: Patient alone at time of admission. Disposition Plan: Expect she will go home when ready for discharge. Consults called: NONE Admission status: Place in observation with telemetry monitoring.   TIME SPENT: 60 minutes   Eber Jones MD [facility] Pager [phone]  If 7PM-7AM, please contact night-coverage [email or web address] Password [email or web address]  12/22/2016, 2:03 AM

## 2016-12-22 NOTE — Progress Notes (Signed)
ANTICOAGULATION CONSULT NOTE - Follow-up Consult  Pharmacy Consult for Heparin Indication: pulmonary embolus  No Known Allergies  Patient Measurements: Height: 5\' 1"  (154.9 cm) Weight: 252 lb 14.4 oz (114.7 kg) IBW/kg (Calculated) : 47.8 Heparin Dosing Weight: 80 kg  Vital Signs: Temp: 97.9 F (36.6 C) (01/27 0552) Temp Source: Oral (01/27 0552) BP: 127/66 (01/27 0552) Pulse Rate: 61 (01/27 0552)  Labs:  Recent Labs  12/21/16 1539 12/22/16 0409  HGB 12.2 12.4  HCT 38.8 38.8  PLT 200 244  HEPARINUNFRC  --  0.67  CREATININE 0.60  --     Estimated Creatinine Clearance: 84.8 mL/min (by C-G formula based on SCr of 0.6 mg/dL).  Assessment: 66 y.o. F on heparin for PE. Heparin level therapeutic (0.67) on gtt at 1300 units/hr. No bleeding noted. CBC stable.  Goal of Therapy:  Heparin level 0.3-0.7 units/ml Monitor platelets by anticoagulation protocol: Yes   Plan:  Continue heparin gtt at 1300 units/hr Will f/u confirmatory heparin level in 6 hours  Sherlon Handing, PharmD, BCPS Clinical pharmacist, pager [phone] 12/22/2016,6:02 AM

## 2016-12-22 NOTE — Progress Notes (Signed)
SATURATION QUALIFICATIONS: (This note is used to comply with regulatory documentation for home oxygen)  Patient Saturations on Room Air at Rest = 96%  Patient Saturations on Room Air while Ambulating = 95%  Patient Saturations on 2 Liters of oxygen while Ambulating = 96%  Please briefly explain why patient needs home oxygen:

## 2016-12-22 NOTE — Progress Notes (Signed)
PROGRESS NOTE    Ariel Henry  [id number] DOB: 1951/08/02 DOA: 12/21/2016 PCP: Benito Mccreedy, MD   Chief Complaint  Patient presents with  . Shortness of Breath     Brief Narrative:  Admitted for PE. Assessment & Plan   Dyspnea secondary to Acute PE/RLE DVT -CTA Chest: +PE -Started on IV heparin, transitioned to Xarelto -LE doppler: Acute DVT right posterior tibial, popliteal, distal femoral veins -Echocardiogram: Normal LV systolic function, grade 1 diastolic dysfunction, severely reduced RV systolic function -Patient will need to follow up with her PCP or hematologist for coag studies -Patient ambulated, oxygen sats maintained in the mid 90s -Given results of echo, PCCM consulted and appreciated.   Thyroid disease -Currently on no home medications -Continue to follow up with PCP  Glaucoma -Continue home medications  Recent admission for aspiration pneumonia -Require intubation -Appears to be stable  Morbid obesity -BMI 47.9 -patient will need to follow up with her PCP to discuss lifestyle modifications  DVT Prophylaxis  xarelto  Code Status: Full  Family Communication: Sons at bedside.  Disposition Plan: Observation. Pending pulm consult.  Consultants PCCM  Procedures  Echocardiogram LE doppler  Antibiotics   Anti-infectives    None      Subjective:   Ariel Henry seen and examined today.  Denies chest pain. Feels breathing has improved. She is able to lay down and sleep now.  Denies abdominal pain, N/V/C/D.  Objective:   Vitals:   12/22/16 0552 12/22/16 1021 12/22/16 1105 12/22/16 1608  BP: 127/66  105/86 122/68  Pulse: 61  66 66  Resp: 18  18 20   Temp: 97.9 F (36.6 C)   98 F (36.7 C)  TempSrc: Oral   Oral  SpO2: 98% 96% 95% 97%  Weight:      Height:        Intake/Output Summary (Last 24 hours) at 12/22/16 1708 Last data filed at 12/22/16 1052  Gross per 24 hour  Intake              558 ml  Output               750 ml  Net             -192 ml   Filed Weights   12/21/16 1532 12/22/16 0109  Weight: 113.9 kg (251 lb) 114.7 kg (252 lb 14.4 oz)    Exam  General: Well developed, well nourished, NAD, appears stated age  HEENT: NCAT, PERRLA, EOMI, Anicteic Sclera, mucous membranes moist.   Neck: Supple, no JVD, no masses  Cardiovascular: S1 S2 auscultated, no rubs, murmurs or gallops. Regular rate and rhythm.  Respiratory: Clear to auscultation bilaterally with equal chest rise  Abdomen: Soft, obese, nontender, nondistended, + bowel sounds  Extremities: warm dry without cyanosis clubbing or edema  Neuro: AAOx3, cranial nerves grossly intact. Strength 5/5 in patient's upper and lower extremities bilaterally  Skin: Without rashes exudates or nodules  Psych: Normal affect and demeanor with intact judgement and insight   Data Reviewed: I have personally reviewed following labs and imaging studies  CBC:  Recent Labs Lab 12/16/16 0209 12/17/16 0544 12/21/16 1539 12/22/16 0409  WBC 7.5 8.1 6.5 7.9  NEUTROABS 5.1 5.4 4.7  --   HGB 13.1 12.9 12.2 12.4  HCT 41.0 40.4 38.8 38.8  MCV 82.3 82.6 83.4 82.4  PLT 232 180 200 [id number]   Basic Metabolic Panel:  Recent Labs Lab 12/16/16 0209 12/17/16 0544 12/21/16 1539  NA 141 138 141  K 3.4* 3.7 3.8  CL 104 102 107  CO2 29 28 25   GLUCOSE 102* 106* 78  BUN 15 14 11   CREATININE 0.61 0.65 0.60  CALCIUM 9.1 9.0 9.3  MG 2.0 2.2  --   PHOS 3.8 4.4  --    GFR: Estimated Creatinine Clearance: 84.8 mL/min (by C-G formula based on SCr of 0.6 mg/dL). Liver Function Tests:  Recent Labs Lab 12/17/16 0544 12/21/16 1539  AST 17 16  ALT 9* 12*  ALKPHOS 65 59  BILITOT 1.3* 0.7  PROT 7.0 7.0  ALBUMIN 3.6 3.6   No results for input(s): LIPASE, AMYLASE in the last 168 hours. No results for input(s): AMMONIA in the last 168 hours. Coagulation Profile: No results for input(s): INR, PROTIME in the last 168 hours. Cardiac Enzymes:  Recent  Labs Lab 12/16/16 0209  TROPONINI 0.19*   BNP (last 3 results) No results for input(s): PROBNP in the last 8760 hours. HbA1C: No results for input(s): HGBA1C in the last 72 hours. CBG:  Recent Labs Lab 12/16/16 0048 12/16/16 0432 12/16/16 0845 12/16/16 1109 12/16/16 1604  GLUCAP 121* 104* 111* 105* 94   Lipid Profile: No results for input(s): CHOL, HDL, LDLCALC, TRIG, CHOLHDL, LDLDIRECT in the last 72 hours. Thyroid Function Tests: No results for input(s): TSH, T4TOTAL, FREET4, T3FREE, THYROIDAB in the last 72 hours. Anemia Panel: No results for input(s): VITAMINB12, FOLATE, FERRITIN, TIBC, IRON, RETICCTPCT in the last 72 hours. Urine analysis:    Component Value Date/Time   COLORURINE STRAW (A) 12/14/2016 2200   APPEARANCEUR CLEAR 12/14/2016 2200   LABSPEC 1.012 12/14/2016 2200   PHURINE 5.0 12/14/2016 2200   GLUCOSEU NEGATIVE 12/14/2016 2200   HGBUR NEGATIVE 12/14/2016 2200   BILIRUBINUR NEGATIVE 12/14/2016 2200   KETONESUR NEGATIVE 12/14/2016 2200   PROTEINUR NEGATIVE 12/14/2016 2200   NITRITE NEGATIVE 12/14/2016 2200   LEUKOCYTESUR NEGATIVE 12/14/2016 2200   Sepsis Labs: @LABRCNTIP (procalcitonin:4,lacticidven:4)  ) Recent Results (from the past 240 hour(s))  Blood Culture (routine x 2)     Status: None   Collection Time: 12/14/16  3:40 PM  Result Value Ref Range Status   Specimen Description BLOOD LEFT ARM  Final   Special Requests BOTTLES DRAWN AEROBIC AND ANAEROBIC 5CC  Final   Culture NO GROWTH 5 DAYS  Final   Report Status 12/19/2016 FINAL  Final  Blood Culture (routine x 2)     Status: None   Collection Time: 12/14/16  3:54 PM  Result Value Ref Range Status   Specimen Description BLOOD RIGHT ARM  Final   Special Requests BOTTLES DRAWN AEROBIC AND ANAEROBIC 5CC  Final   Culture NO GROWTH 5 DAYS  Final   Report Status 12/19/2016 FINAL  Final  MRSA PCR Screening     Status: None   Collection Time: 12/14/16  8:55 PM  Result Value Ref Range Status    MRSA by PCR NEGATIVE NEGATIVE Final    Comment:        The GeneXpert MRSA Assay (FDA approved for NASAL specimens only), is one component of a comprehensive MRSA colonization surveillance program. It is not intended to diagnose MRSA infection nor to guide or monitor treatment for MRSA infections.   Urine culture     Status: None   Collection Time: 12/14/16 10:00 PM  Result Value Ref Range Status   Specimen Description URINE, RANDOM  Final   Special Requests NONE  Final   Culture NO GROWTH  Final   Report Status 12/16/2016 FINAL  Final      Radiology Studies: Dg Chest 2 View  Result Date: 12/21/2016 CLINICAL DATA:  Cough and shortness of breath for 2 weeks, audible rhonchi and wheezing, recent hospitalization with intubation EXAM: CHEST  2 VIEW COMPARISON:  10/05/2013, correlation CT chest 02/16/2016 FINDINGS: Enlargement of cardiac silhouette. Tortuous aorta with atherosclerotic calcification. Enlargement of superior mediastinum with tracheal deviation to the RIGHT corresponding to large superior mediastinal soft tissue mass identified on prior CT exam, by ultrasound felt to likely represent a large goiter. Eventration RIGHT diaphragm. Bronchitic changes with RIGHT basilar atelectasis. No definite infiltrate, pleural effusion or pneumothorax. IMPRESSION: Superior mediastinal mass/mass effect corresponding to soft tissue mass identified on prior CT, felt to be a goiter by prior thyroid ultrasound. Aortic atherosclerosis. Bronchitic changes with RIGHT basilar atelectasis. Electronically Signed   By: Lavonia Dana M.D.   On: 12/21/2016 14:50   Ct Angio Chest Pe W And/or Wo Contrast  Result Date: 12/21/2016 CLINICAL DATA:  66 year old female with elevated D-dimer presenting with shortness of breath. Recent hospitalization. EXAM: CT ANGIOGRAPHY CHEST WITH CONTRAST TECHNIQUE: Multidetector CT imaging of the chest was performed using the standard protocol during bolus administration of  intravenous contrast. Multiplanar CT image reconstructions and MIPs were obtained to evaluate the vascular anatomy. CONTRAST:  100 cc Isovue 370 COMPARISON:  Chest radiograph 12/21/2016 and chest CT dated 02/16/2016 FINDINGS: Cardiovascular: There is mild cardiomegaly with mild enlargement of the left ventricle. No pericardial effusion. The thoracic aorta is unremarkable. The origins of the great vessels of the aortic arch appear patent. There is pulmonary embolism involving the right pulmonary artery extending into the lobar and segmental branches of the right lower lobe as well as extending into the right upper lobe. Left lower lobe subsegmental pulmonary embolus noted. No CT evidence of right heart straining. Mediastinum/Nodes: There is no hilar adenopathy. Upper mediastinal mass with splaying of the branches of the aortic arch with mass effect and mild displacement of the trachea to the right of the midline as seen on the prior CT and likely corresponds to the large goiter described on the prior ultrasound. Lungs/Pleura: The lungs are clear. There is no pleural effusion or pneumothorax. The central airways are patent. Upper Abdomen: Apparent fatty infiltration of the liver. Multiple stones noted within the gallbladder. No pericholecystic fluid. The visualized upper abdomen is otherwise unremarkable. Musculoskeletal: There is multilevel degenerative changes of the spine. No acute fracture. Review of the MIP images confirms the above findings. IMPRESSION: Positive for pulmonary artery embolus. No definite CT evidence of right cardiac strain. Correlation with clinical exam, EKG, and echocardiogram recommended. Upper mediastinal mass as seen on the prior CT likely related to large goiter. These results were called by telephone at the time of interpretation on 12/21/2016 at 10:40 pm to Dr. Zenovia Jarred , who verbally acknowledged these results. Electronically Signed   By: Anner Crete M.D.   On: 12/21/2016  22:44     Scheduled Meds: . dorzolamide  1 drop Both Eyes TID  . gabapentin  300 mg Oral QHS  . gabapentin  600 mg Oral BID  . latanoprost  1 drop Both Eyes QHS  . rivaroxaban  15 mg Oral BID  . [START ON 01/12/2018] rivaroxaban  20 mg Oral Q supper  . sodium chloride flush  3 mL Intravenous Q12H  . timolol  1 drop Both Eyes Daily   Continuous Infusions:   LOS: 0 days   Time Spent in minutes   30 minutes  Tahsin Benyo,  Kele Barthelemy D.O. on 12/22/2016 at 5:08 PM  Between 7am to 7pm - Pager - [phone]  After 7pm go to [email or web address] - password [email or web address]  And look for the night coverage person covering for me after hours  [facility] Office  [phone]

## 2016-12-22 NOTE — Progress Notes (Signed)
  Echocardiogram 2D Echocardiogram with Definity has been performed.  [name] 12/22/2016, 12:08 PM

## 2016-12-22 NOTE — Progress Notes (Signed)
Vascular lab called and and gave a preliminary report   that the patient has an acute DVT in the right posterior tibial, popliteal, and distal femoral veins. Dr. Ree Kida notified.

## 2016-12-22 NOTE — Progress Notes (Signed)
VASCULAR LAB PRELIMINARY  PRELIMINARY  PRELIMINARY  PRELIMINARY  Bilateral lower extremity venous duplex completed.    Preliminary report:  There is acute DVT noted in the right posterior tibial, popliteal, and distal femoral veins.   Called report to [place], RN  [facility], [place], RVT 12/22/2016, 12:36 PM

## 2016-12-23 DIAGNOSIS — I2609 Other pulmonary embolism with acute cor pulmonale: Secondary | ICD-10-CM | POA: Diagnosis not present

## 2016-12-23 DIAGNOSIS — M79604 Pain in right leg: Secondary | ICD-10-CM | POA: Diagnosis not present

## 2016-12-23 DIAGNOSIS — E039 Hypothyroidism, unspecified: Secondary | ICD-10-CM | POA: Diagnosis not present

## 2016-12-23 DIAGNOSIS — H409 Unspecified glaucoma: Secondary | ICD-10-CM | POA: Diagnosis not present

## 2016-12-23 LAB — BASIC METABOLIC PANEL
ANION GAP: 8 (ref 5–15)
BUN: 14 mg/dL (ref 6–20)
CALCIUM: 9 mg/dL (ref 8.9–10.3)
CO2: 26 mmol/L (ref 22–32)
Chloride: 106 mmol/L (ref 101–111)
Creatinine, Ser: 0.66 mg/dL (ref 0.44–1.00)
GLUCOSE: 89 mg/dL (ref 65–99)
POTASSIUM: 3.9 mmol/L (ref 3.5–5.1)
SODIUM: 140 mmol/L (ref 135–145)

## 2016-12-23 LAB — CBC
HCT: 35 % — ABNORMAL LOW (ref 36.0–46.0)
Hemoglobin: 11.3 g/dL — ABNORMAL LOW (ref 12.0–15.0)
MCH: 26.7 pg (ref 26.0–34.0)
MCHC: 32.3 g/dL (ref 30.0–36.0)
MCV: 82.7 fL (ref 78.0–100.0)
PLATELETS: 239 10*3/uL (ref 150–400)
RBC: 4.23 MIL/uL (ref 3.87–5.11)
RDW: 14.3 % (ref 11.5–15.5)
WBC: 7.7 10*3/uL (ref 4.0–10.5)

## 2016-12-23 LAB — TSH: TSH: 1.281 u[IU]/mL (ref 0.350–4.500)

## 2016-12-23 LAB — TROPONIN I: TROPONIN I: 0.03 ng/mL — AB (ref <0.03)

## 2016-12-23 LAB — BRAIN NATRIURETIC PEPTIDE: B NATRIURETIC PEPTIDE 5: 13.6 pg/mL (ref 0.0–100.0)

## 2016-12-23 NOTE — Progress Notes (Signed)
Critical lab value: Troponin 0.03 . MD notified. Awaiting for response.

## 2016-12-23 NOTE — Progress Notes (Signed)
CRITICAL VALUE ALERT  Critical value received:  Troponin 0.03  Date of notification:  12/23/16  Time of notification:  12:20 pm  Critical value read back:Yes.    Nurse who received alert: Rafael Bihari  MD notified (1st page):  Ree Kida, MD  Time of first page:  12:27 pm  MD notified (2nd page):  Time of second page:  Responding MD:  Ree Kida, MD  Time MD responded:  12:32 pm

## 2016-12-23 NOTE — Evaluation (Signed)
Physical Therapy Evaluation Patient Details Name: Ariel Henry MRN: [id number] DOB: 07-29-1951 Today's Date: 12/23/2016   History of Present Illness  Patient is a 66 yo female who presents with Dyspnea secondary to Acute PE/RLE DVT.  Clinical Impression  Patient seen for mobility assessment and education re: energy conservation. Patient with some altered gait pattern at baseline however, no needs for physical assist and no overt LOB noted. Ambulated on room air with saturations 97% at rest, 90% with ambulation and stairs, 93% upon sitting after mobility on room air. At this time, patient at her mobility baseline, no further acute PT needs, will sign off.     Follow Up Recommendations No PT follow up    Equipment Recommendations  None recommended by PT    Recommendations for Other Services       Precautions / Restrictions Precautions Precaution Comments: watch O2 saturations      Mobility  Bed Mobility               General bed mobility comments: received sitting EOB  Transfers Overall transfer level: Independent Equipment used: None                Ambulation/Gait Ambulation/Gait assistance: Independent Ambulation Distance (Feet): 210 Feet Assistive device: None Gait Pattern/deviations: Drifts right/left;Wide base of support;Decreased stride length Gait velocity: decreased Gait velocity interpretation: Below normal speed for age/gender General Gait Details: slow waddling gait pattern. Some modest instability, no physical assist requried, no overt LOB  Stairs Stairs: Yes Stairs assistance: Modified independent (Device/Increase time) Stair Management: One rail Right Number of Stairs: 3 General stair comments: no physical assist or cues. 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, newspaper   Dominant Hand: Right    Extremity/Trunk Assessment   Upper Extremity Assessment Upper Extremity Assessment: Overall WFL for tasks assessed    Lower Extremity Assessment Lower Extremity Assessment: Overall WFL for tasks assessed (+ arthritic changes noted but overall WFL)    Cervical / Trunk Assessment Cervical / Trunk Assessment:  (increased body habitus)  Communication   Communication: No difficulties  Cognition Arousal/Alertness: Awake/alert Behavior During Therapy: WFL for tasks assessed/performed Overall Cognitive Status: Within Functional Limits for tasks assessed                      General Comments      Exercises     Assessment/Plan    PT Assessment Patent does not need any further PT services  PT Problem List            PT Treatment Interventions      PT Goals (Current goals can be found in the Care Plan  section)  Acute Rehab PT Goals PT Goal Formulation: All assessment and education complete, DC therapy (educated on energy conservation strategies)    Frequency     Barriers to discharge        Co-evaluation               End of Session Equipment Utilized During Treatment: Gait belt Activity Tolerance: Patient tolerated treatment well Patient left: in bed;with call bell/phone within reach (sitting EOB) Nurse Communication: Mobility status    Functional Assessment Tool Used: clinical judgement Functional Limitation: Mobility: Walking and moving around Mobility: Walking and Moving  Around Current Status [id number]): At least 1 percent but less than 20 percent impaired, limited or restricted Mobility: Walking and Moving Around Goal Status [phone]): At least 1 percent but less than 20 percent impaired, limited or restricted Mobility: Walking and Moving Around Discharge Status [phone]): At least 1 percent but less than 20 percent impaired, limited or restricted    Time: 1019-1036 PT Time Calculation (min) (ACUTE ONLY): 17 min   Charges:   PT Evaluation $PT Eval Moderate Complexity: 1 Procedure     PT G Codes:   PT G-Codes **NOT FOR INPATIENT CLASS** Functional Assessment Tool Used: clinical judgement Functional Limitation: Mobility: Walking and moving around Mobility: Walking and Moving Around Current Status [id number]): At least 1 percent but less than 20 percent impaired, limited or restricted Mobility: Walking and Moving Around Goal Status [phone]): At least 1 percent but less than 20 percent impaired, limited or restricted Mobility: Walking and Moving Around Discharge Status [phone]): At least 1 percent but less than 20 percent impaired, limited or restricted    Duncan Dull 12/23/2016, 10:41 AM  Alben Deeds, PT DPT  [phone]

## 2016-12-23 NOTE — Consult Note (Signed)
PULMONARY / CRITICAL CARE MEDICINE   Name: Ariel Henry MRN: [id number] DOB: 08-23-1951    ADMISSION DATE:  12/21/2016 CONSULTATION DATE:  12/23/16   REFERRING MD:  Triad  CHIEF COMPLAINT:  I want to go home  HISTORY OF PRESENT ILLNESS:   5 yobf never smoker admitted 1/26 with acute onset sob x 2 days pta and R calf pain  p admit 1/19-22 with aspiration/vent dep and was found to have PE and R dvt and placed on heparin.  A f/u echo obtained suggested severe PH so pccm consulted am 1/28 but by that time already transitioned to xarelto and requesting discharge  Pt is an exceptionally difficult historian and did not give me the same hx she gave Triad focused instead on her "reaction to mucinex"  ? Why did you take it in the first place  A  To keep from getting sick  Denies sob or   assoc excess/ purulent sputum or mucus plugs or hemoptysis or cp or chest tightness, subjective wheeze or overt sinus or hb symptoms. No unusual exp hx or h/o childhood pna/ asthma or knowledge of premature birth.  Sleeping ok without nocturnal  or early am exacerbation  of respiratory  c/o's or need for noct saba. Also denies any obvious fluctuation of symptoms with weather or environmental changes or other aggravating or alleviating factors except as outlined above   Current Medications, Allergies, Complete Past Medical History, Past Surgical History, Family History, and Social History were reviewed in [employer] [employer] record.  ROS  The following are not active complaints unless bolded sore throat, dysphagia, dental problems, itching, sneezing,  nasal congestion or excess/ purulent secretions, ear ache,   fever, chills, sweats, unintended wt loss, classically pleuritic or exertional cp,  orthopnea pnd or leg swelling, presyncope, palpitations, abdominal pain, anorexia, nausea, vomiting, diarrhea  or change in bowel or bladder habits, change in stools or urine, dysuria,hematuria,  rash,  arthralgias, visual complaints, headache, numbness, weakness or ataxia or problems with walking or coordination,  change in mood/affect or memory.        PAST MEDICAL HISTORY :  She  has a past medical history of Arthritis; Glaucoma; Neuromuscular disorder ([place]); Pneumonia; and Thyroid disease.  PAST SURGICAL HISTORY: She  has a past surgical history that includes Abdominal hysterectomy; bladder tack; Eye surgery; and Colonoscopy with propofol (N/A, 09/04/2016).  No Known Allergies  No current facility-administered medications on file prior to encounter.    Current Outpatient Prescriptions on File Prior to Encounter  Medication Sig  . albuterol (PROVENTIL HFA;VENTOLIN HFA) 108 (90 BASE) MCG/ACT inhaler Inhale 1-2 puffs into the lungs every 6 (six) hours as needed for wheezing.  Marland Kitchen aspirin EC 81 MG tablet Take 81 mg by mouth daily.  . cholecalciferol (VITAMIN D) 1000 units tablet Take 1,000 Units by mouth daily.  . dorzolamide (TRUSOPT) 2 % ophthalmic solution 1 drop 3 (three) times daily.  Marland Kitchen gabapentin (NEURONTIN) 300 MG capsule Take 300 mg by mouth at bedtime.  . gabapentin (NEURONTIN) 300 MG capsule Take 300 mg by mouth 2 (two) times daily. 600mg  in morning and afternoon, 300mg  at bedtime   . latanoprost (XALATAN) 0.005 % ophthalmic solution Place 1 drop into both eyes at bedtime.  Vladimir Faster Glycol-Propyl Glycol (SYSTANE ULTRA OP) Apply to eye.  . pyridOXINE (VITAMIN B-6) 100 MG tablet Take 100 mg by mouth daily.  . timolol (TIMOPTIC) 0.5 % ophthalmic solution 1 drop 2 (two) times daily.  . Timolol Maleate  PF 0.5 % SOLN Apply 1 drop to eye every morning.  . hydrOXYzine (ATARAX/VISTARIL) 25 MG tablet Take 25 mg by mouth 4 (four) times daily as needed for anxiety.    FAMILY HISTORY:  Her indicated that the status of her neg hx is unknown.    SOCIAL HISTORY: She  reports that she has never smoked. She has never used smokeless tobacco. She reports that she does not drink alcohol or  use drugs.     SUBJECTIVE:  Nad sitting on side of bed on  RA  VITAL SIGNS: BP (!) 97/52 (BP Location: Left Wrist)   Pulse (!) 57   Temp 97.5 F (36.4 C) (Oral)   Resp 18   Ht 5\' 1"  (1.549 m)   Wt 252 lb 14.4 oz (114.7 kg)   SpO2 95%   BMI 47.79 kg/m   HEMODYNAMICS:    VENTILATOR SETTINGS:    INTAKE / OUTPUT: I/O last 3 completed shifts: In: 1038 [P.O.:960; I.V.:78] Out: 1700 [Urine:1700]  PHYSICAL EXAMINATION: Wt Readings from Last 3 Encounters:  12/22/16 252 lb 14.4 oz (114.7 kg)  12/17/16 251 lb (113.9 kg)  09/04/16 265 lb (120.2 kg)    Vital signs reviewed   Body mass index is 47.79 kg/m.   HEENT: nl dentition, turbinates, and oropharynx. Nl external ear canals without cough reflex   NECK :  without JVD/Nodes/TM/ nl carotid upstrokes bilaterally   LUNGS: no acc muscle use,  Nl contour chest which is clear to A and P bilaterally without cough on insp or exp maneuvers   CV:  RRR  no s3 or murmur or increase in P2, nad no edema   ABD:  Very obese soft and nontender with nl inspiratory excursion in the supine position. No bruits or organomegaly appreciated, bowel sounds nl  MS:  Nl gait/ ext warm without deformities, calf tenderness, cyanosis or clubbing No obvious joint restrictions   SKIN: warm and dry without lesions    NEURO:  alert, approp, nl sensorium with  no motor or cerebellar deficits apparent.    LABS:  BMET  Recent Labs Lab 12/17/16 0544 12/21/16 1539 12/23/16 0504  NA 138 141 140  K 3.7 3.8 3.9  CL 102 107 106  CO2 28 25 26   BUN 14 11 14   CREATININE 0.65 0.60 0.66  GLUCOSE 106* 78 89    Electrolytes  Recent Labs Lab 12/17/16 0544 12/21/16 1539 12/23/16 0504  CALCIUM 9.0 9.3 9.0  MG 2.2  --   --   PHOS 4.4  --   --     CBC  Recent Labs Lab 12/21/16 1539 12/22/16 0409 12/23/16 0504  WBC 6.5 7.9 7.7  HGB 12.2 12.4 11.3*  HCT 38.8 38.8 35.0*  PLT 200 244 239    Coag's No results for input(s): APTT, INR  in the last 168 hours.  Sepsis Markers No results for input(s): LATICACIDVEN, PROCALCITON, O2SATVEN in the last 168 hours.  ABG  Recent Labs Lab 12/21/16 1807  PHART 7.423  PCO2ART 38.3  PO2ART 66.0*    Liver Enzymes  Recent Labs Lab 12/17/16 0544 12/21/16 1539  AST 17 16  ALT 9* 12*  ALKPHOS 65 59  BILITOT 1.3* 0.7  ALBUMIN 3.6 3.6    Cardiac Enzymes No results for input(s): TROPONINI, PROBNP in the last 168 hours.  Glucose  Recent Labs Lab 12/16/16 1109 12/16/16 1604  GLUCAP 105* 94      I personally reviewed images and agree with radiology impression as  follows:  CTa Chest  12/21/16 There is pulmonary embolism involving the right pulmonary artery extending into the lobar and segmental branches of the right lower lobe as well as extending into the right upper lobe. Left lower lobe subsegmental pulmonary embolus noted. No CT evidence of right heart straining.    ASSESSMENT / PLAN:  1)  Acute PE symtpoms since 12/20/15 dx by CTa 1/26 - -LE doppler: Acute DVT right posterior tibial, popliteal, distal femoral veins  - Echocardiogram: Normal LV systolic function, grade 1 diastolic dysfunction, severely reduced RV systolic function - though this was not seen on CTa  - clinically improved though has new RV dysfunction with elevated PAS  Since hemodynamics fine now and sats ok on RA it is unlikely she had additional PE after the CTa while on heparin but  prior to the echocardiogram which is difficult to make sense of but the only additional concern I have at this point that might change her treatment is whether she might have had a right ventricular infarction from the PE induced RV strain and might be in need therefore of a cardiology evaluation prior to discharge because she may well have underlying ischemic heart disease as the RV strain should not have been that severe based on the CTa.  If her troponins are negative and she is feeling fine on Xarelto if we can  be sure she has access to it then I think it would be appropriate to discharge her with follow-up echocardiogram in 3 months and repeat venous Dopplers in 6 months prior to consideration of taking her off of her anticoagulation with the main ongoing risk factor being morbid obesity and workup for hypercoagulability at some point prior to permanently stopping all anticoagulation.   2) severe obesity  Body mass index is 47.79 kg/m.  No results found for: TSH > ordered  Contributing to dvt risk/ doe/reviewed the need and the process to achieve and maintain neg calorie balance > defer f/u primary care including intermittently monitoring thyroid status    Christinia Gully, MD Pulmonary and [place] [phone] After 5:30 PM or weekends, use [facility] [phone]

## 2016-12-23 NOTE — Care Management Note (Signed)
Case Management Note  Patient Details  Name: DEMONICA FARREY MRN: [id number] Date of Birth: May 04, 1951  Subjective/Objective:                  Marland Kitchen Shortness of Breath   Action/Plan: Discharge planning Expected Discharge Date:  12/18/16               Expected Discharge Plan:  Home/Self Care  In-House Referral:     Discharge planning Services  CM Consult, Medication Assistance  Post Acute Care Choice:    Choice offered to:  NA  DME Arranged:  N/A DME Agency:  NA  HH Arranged:  NA HH Agency:  NA  Status of Service:  In process, will continue to follow  If discussed at Long Length of Stay Meetings, dates discussed:    Additional Comments: CM met with pt and gave pt a ree 30 day trial card for Xarelto.  Pt verbalized understanding the card will pay for the discharge prescription and give insurance time to authorize for the refills. Cm has placed a Benefit  Check request to see the cost of Xarelto.  CM will continue to follow for result of Benefits Check. Dellie Catholic, RN 12/23/2016, 1:25 PM

## 2016-12-23 NOTE — Progress Notes (Signed)
PROGRESS NOTE    Ariel Henry  [id number] DOB: [birth year] DOA: 12/21/2016 PCP: Benito Mccreedy, MD   Chief Complaint  Patient presents with  . Shortness of Breath     Brief Narrative:  Admitted for PE. Assessment & Plan   Dyspnea secondary to Acute PE/RLE DVT -CTA Chest: +PE -Started on IV heparin, transitioned to Xarelto -LE doppler: Acute DVT right posterior tibial, popliteal, distal femoral veins -Echocardiogram: Normal LV systolic function, grade 1 diastolic dysfunction, severely reduced RV systolic function -Patient will need to follow up with her PCP or hematologist for coag studies -Patient ambulated, oxygen sats maintained in the mid 90s -Given results of echo, PCCM consulted and appreciated. Recommended continued observation and cycle troponins- concern for ?RV infarct from PE. -PT consulted, no needs -Case management consulted for Xarelto coverage  Thyroid disease -Currently on no home medications -Continue to follow up with PCP  Glaucoma -Continue home medications  Recent admission for aspiration pneumonia -Require intubation -Appears to be stable  Morbid obesity -BMI 47.9 -patient will need to follow up with her PCP to discuss lifestyle modifications  DVT Prophylaxis  xarelto  Code Status: Full  Family Communication: Sons at bedside.  Disposition Plan: Observation. Pending pulm consult.  Consultants PCCM  Procedures  Echocardiogram LE doppler  Antibiotics   Anti-infectives    None      Subjective:   Ariel Henry seen and examined today.  Denies chest pain. Feels breathing has improved and back to her baseline. Denies abdominal pain, N/V/C/D.  Objective:   Vitals:   12/22/16 1105 12/22/16 1608 12/22/16 1930 12/23/16 0418  BP: 105/86 122/68 (!) 113/54 (!) 97/52  Pulse: 66 66 76 (!) 57  Resp: 18 20 18 18   Temp:  98 F (36.7 C) 97.7 F (36.5 C) 97.5 F (36.4 C)  TempSrc:  Oral Oral Oral  SpO2: 95% 97% 100% 95%    Weight:      Height:        Intake/Output Summary (Last 24 hours) at 12/23/16 1109 Last data filed at 12/23/16 0730  Gross per 24 hour  Intake              480 ml  Output              951 ml  Net             -471 ml   Filed Weights   12/21/16 1532 12/22/16 0109  Weight: 113.9 kg (251 lb) 114.7 kg (252 lb 14.4 oz)    Exam  General: Well developed, well nourished, NAD, appears stated age  HEENT: NCAT, mucous membranes moist.   Cardiovascular: S1 S2 auscultated, no rubs, murmurs or gallops. Regular rate and rhythm.  Respiratory: Clear to auscultation bilaterally with equal chest rise  Abdomen: Soft, obese, nontender, nondistended, + bowel sounds  Extremities: warm dry without cyanosis clubbing or edema  Neuro: AAOx3, nonfocal  Psych: Normal affect and demeanor.     Data Reviewed: I have personally reviewed following labs and imaging studies  CBC:  Recent Labs Lab 12/17/16 0544 12/21/16 1539 12/22/16 0409 12/23/16 0504  WBC 8.1 6.5 7.9 7.7  NEUTROABS 5.4 4.7  --   --   HGB 12.9 12.2 12.4 11.3*  HCT 40.4 38.8 38.8 35.0*  MCV 82.6 83.4 82.4 82.7  PLT 180 200 244 [id number]   Basic Metabolic Panel:  Recent Labs Lab 12/17/16 0544 12/21/16 1539 12/23/16 0504  NA 138 141 140  K 3.7 3.8 3.9  CL 102  107 106  CO2 28 25 26   GLUCOSE 106* 78 89  BUN 14 11 14   CREATININE 0.65 0.60 0.66  CALCIUM 9.0 9.3 9.0  MG 2.2  --   --   PHOS 4.4  --   --    GFR: Estimated Creatinine Clearance: 84.8 mL/min (by C-G formula based on SCr of 0.66 mg/dL). Liver Function Tests:  Recent Labs Lab 12/17/16 0544 12/21/16 1539  AST 17 16  ALT 9* 12*  ALKPHOS 65 59  BILITOT 1.3* 0.7  PROT 7.0 7.0  ALBUMIN 3.6 3.6   No results for input(s): LIPASE, AMYLASE in the last 168 hours. No results for input(s): AMMONIA in the last 168 hours. Coagulation Profile: No results for input(s): INR, PROTIME in the last 168 hours. Cardiac Enzymes: No results for input(s): CKTOTAL, CKMB,  CKMBINDEX, TROPONINI in the last 168 hours. BNP (last 3 results) No results for input(s): PROBNP in the last 8760 hours. HbA1C: No results for input(s): HGBA1C in the last 72 hours. CBG:  Recent Labs Lab 12/16/16 1604  GLUCAP 94   Lipid Profile: No results for input(s): CHOL, HDL, LDLCALC, TRIG, CHOLHDL, LDLDIRECT in the last 72 hours. Thyroid Function Tests: No results for input(s): TSH, T4TOTAL, FREET4, T3FREE, THYROIDAB in the last 72 hours. Anemia Panel: No results for input(s): VITAMINB12, FOLATE, FERRITIN, TIBC, IRON, RETICCTPCT in the last 72 hours. Urine analysis:    Component Value Date/Time   COLORURINE STRAW (A) 12/14/2016 2200   APPEARANCEUR CLEAR 12/14/2016 2200   LABSPEC 1.012 12/14/2016 2200   PHURINE 5.0 12/14/2016 2200   GLUCOSEU NEGATIVE 12/14/2016 2200   HGBUR NEGATIVE 12/14/2016 2200   BILIRUBINUR NEGATIVE 12/14/2016 2200   KETONESUR NEGATIVE 12/14/2016 2200   PROTEINUR NEGATIVE 12/14/2016 2200   NITRITE NEGATIVE 12/14/2016 2200   LEUKOCYTESUR NEGATIVE 12/14/2016 2200   Sepsis Labs: @LABRCNTIP (procalcitonin:4,lacticidven:4)  ) Recent Results (from the past 240 hour(s))  Blood Culture (routine x 2)     Status: None   Collection Time: 12/14/16  3:40 PM  Result Value Ref Range Status   Specimen Description BLOOD LEFT ARM  Final   Special Requests BOTTLES DRAWN AEROBIC AND ANAEROBIC 5CC  Final   Culture NO GROWTH 5 DAYS  Final   Report Status 12/19/2016 FINAL  Final  Blood Culture (routine x 2)     Status: None   Collection Time: 12/14/16  3:54 PM  Result Value Ref Range Status   Specimen Description BLOOD RIGHT ARM  Final   Special Requests BOTTLES DRAWN AEROBIC AND ANAEROBIC 5CC  Final   Culture NO GROWTH 5 DAYS  Final   Report Status 12/19/2016 FINAL  Final  MRSA PCR Screening     Status: None   Collection Time: 12/14/16  8:55 PM  Result Value Ref Range Status   MRSA by PCR NEGATIVE NEGATIVE Final    Comment:        The GeneXpert MRSA Assay  (FDA approved for NASAL specimens only), is one component of a comprehensive MRSA colonization surveillance program. It is not intended to diagnose MRSA infection nor to guide or monitor treatment for MRSA infections.   Urine culture     Status: None   Collection Time: 12/14/16 10:00 PM  Result Value Ref Range Status   Specimen Description URINE, RANDOM  Final   Special Requests NONE  Final   Culture NO GROWTH  Final   Report Status 12/16/2016 FINAL  Final      Radiology Studies: Dg Chest 2 View  Result Date: 12/21/2016 CLINICAL DATA:  Cough and shortness of breath for 2 weeks, audible rhonchi and wheezing, recent hospitalization with intubation EXAM: CHEST  2 VIEW COMPARISON:  10/05/2013, correlation CT chest 02/16/2016 FINDINGS: Enlargement of cardiac silhouette. Tortuous aorta with atherosclerotic calcification. Enlargement of superior mediastinum with tracheal deviation to the RIGHT corresponding to large superior mediastinal soft tissue mass identified on prior CT exam, by ultrasound felt to likely represent a large goiter. Eventration RIGHT diaphragm. Bronchitic changes with RIGHT basilar atelectasis. No definite infiltrate, pleural effusion or pneumothorax. IMPRESSION: Superior mediastinal mass/mass effect corresponding to soft tissue mass identified on prior CT, felt to be a goiter by prior thyroid ultrasound. Aortic atherosclerosis. Bronchitic changes with RIGHT basilar atelectasis. Electronically Signed   By: Lavonia Dana M.D.   On: 12/21/2016 14:50   Ct Angio Chest Pe W And/or Wo Contrast  Result Date: 12/21/2016 CLINICAL DATA:  66 year old female with elevated D-dimer presenting with shortness of breath. Recent hospitalization. EXAM: CT ANGIOGRAPHY CHEST WITH CONTRAST TECHNIQUE: Multidetector CT imaging of the chest was performed using the standard protocol during bolus administration of intravenous contrast. Multiplanar CT image reconstructions and MIPs were obtained to  evaluate the vascular anatomy. CONTRAST:  100 cc Isovue 370 COMPARISON:  Chest radiograph 12/21/2016 and chest CT dated 02/16/2016 FINDINGS: Cardiovascular: There is mild cardiomegaly with mild enlargement of the left ventricle. No pericardial effusion. The thoracic aorta is unremarkable. The origins of the great vessels of the aortic arch appear patent. There is pulmonary embolism involving the right pulmonary artery extending into the lobar and segmental branches of the right lower lobe as well as extending into the right upper lobe. Left lower lobe subsegmental pulmonary embolus noted. No CT evidence of right heart straining. Mediastinum/Nodes: There is no hilar adenopathy. Upper mediastinal mass with splaying of the branches of the aortic arch with mass effect and mild displacement of the trachea to the right of the midline as seen on the prior CT and likely corresponds to the large goiter described on the prior ultrasound. Lungs/Pleura: The lungs are clear. There is no pleural effusion or pneumothorax. The central airways are patent. Upper Abdomen: Apparent fatty infiltration of the liver. Multiple stones noted within the gallbladder. No pericholecystic fluid. The visualized upper abdomen is otherwise unremarkable. Musculoskeletal: There is multilevel degenerative changes of the spine. No acute fracture. Review of the MIP images confirms the above findings. IMPRESSION: Positive for pulmonary artery embolus. No definite CT evidence of right cardiac strain. Correlation with clinical exam, EKG, and echocardiogram recommended. Upper mediastinal mass as seen on the prior CT likely related to large goiter. These results were called by telephone at the time of interpretation on 12/21/2016 at 10:40 pm to Dr. Zenovia Jarred , who verbally acknowledged these results. Electronically Signed   By: Anner Crete M.D.   On: 12/21/2016 22:44     Scheduled Meds: . dorzolamide  1 drop Both Eyes TID  . gabapentin  300  mg Oral QHS  . gabapentin  600 mg Oral BID  . latanoprost  1 drop Both Eyes QHS  . rivaroxaban  15 mg Oral BID  . [START ON 01/12/2018] rivaroxaban  20 mg Oral Q supper  . sodium chloride flush  3 mL Intravenous Q12H  . timolol  1 drop Both Eyes Daily   Continuous Infusions:   LOS: 0 days   Time Spent in minutes   30 minutes  Ariel Henry D.O. on 12/23/2016 at 11:09 AM  Between 7am to 7pm -  Pager - [phone]  After 7pm go to [email or web address] - password [email or web address]  And look for the night coverage person covering for me after hours  [facility] Office  [phone]

## 2016-12-24 DIAGNOSIS — M79604 Pain in right leg: Secondary | ICD-10-CM | POA: Diagnosis not present

## 2016-12-24 DIAGNOSIS — E039 Hypothyroidism, unspecified: Secondary | ICD-10-CM | POA: Diagnosis not present

## 2016-12-24 DIAGNOSIS — H409 Unspecified glaucoma: Secondary | ICD-10-CM | POA: Diagnosis not present

## 2016-12-24 DIAGNOSIS — I824Z1 Acute embolism and thrombosis of unspecified deep veins of right distal lower extremity: Secondary | ICD-10-CM

## 2016-12-24 DIAGNOSIS — I2609 Other pulmonary embolism with acute cor pulmonale: Secondary | ICD-10-CM | POA: Diagnosis not present

## 2016-12-24 LAB — BASIC METABOLIC PANEL
Anion gap: 6 (ref 5–15)
BUN: 15 mg/dL (ref 6–20)
CHLORIDE: 105 mmol/L (ref 101–111)
CO2: 29 mmol/L (ref 22–32)
CREATININE: 0.72 mg/dL (ref 0.44–1.00)
Calcium: 8.8 mg/dL — ABNORMAL LOW (ref 8.9–10.3)
GFR calc Af Amer: >60 mL/min (ref >60)
GFR calc non Af Amer: >60 mL/min (ref >60)
GLUCOSE: 106 mg/dL — AB (ref 65–99)
Potassium: 4 mmol/L (ref 3.5–5.1)
Sodium: 140 mmol/L (ref 135–145)

## 2016-12-24 LAB — CBC
HEMATOCRIT: 35.2 % — AB (ref 36.0–46.0)
HEMOGLOBIN: 11.1 g/dL — AB (ref 12.0–15.0)
MCH: 26.2 pg (ref 26.0–34.0)
MCHC: 31.5 g/dL (ref 30.0–36.0)
MCV: 83 fL (ref 78.0–100.0)
Platelets: 264 10*3/uL (ref 150–400)
RBC: 4.24 MIL/uL (ref 3.87–5.11)
RDW: 14 % (ref 11.5–15.5)
WBC: 6.9 10*3/uL (ref 4.0–10.5)

## 2016-12-24 LAB — TROPONIN I: Troponin I: <0.03 ng/mL (ref <0.03)

## 2016-12-24 NOTE — Progress Notes (Signed)
RE: Benefit check  Received: Today  Message Contents  Memory Argue CMA        [facility] # [phone]    XARELTO  20 MG BID ( RIVAROXABAN )   COVER- YES  CO-PAY- $ 141.59  DEDUCTIBLE NOT MET  TIER- 3 DRUG  PRIOR APPROVAL- NO  PHARMACY : [facility], [facility], [facility] AND [facility]

## 2016-12-24 NOTE — Progress Notes (Signed)
Patient provided discharge instructions including medication instructions, prescriptions, and follow up appointment in place. Patient verbalizes understanding of all discharge instructions.

## 2016-12-27 ENCOUNTER — Other Ambulatory Visit: Payer: Self-pay | Admitting: Physician Assistant

## 2016-12-27 DIAGNOSIS — Z1231 Encounter for screening mammogram for malignant neoplasm of breast: Secondary | ICD-10-CM

## 2017-01-10 ENCOUNTER — Ambulatory Visit: Payer: PRIVATE HEALTH INSURANCE

## 2017-01-16 ENCOUNTER — Ambulatory Visit: Payer: PRIVATE HEALTH INSURANCE

## 2017-01-31 ENCOUNTER — Ambulatory Visit
Admission: RE | Admit: 2017-01-31 | Discharge: 2017-01-31 | Disposition: A | Discharge status: Home / Self Care | Payer: BLUE CROSS/BLUE SHIELD | Source: Ambulatory Visit | Attending: Physician Assistant | Admitting: Physician Assistant

## 2017-01-31 DIAGNOSIS — Z1231 Encounter for screening mammogram for malignant neoplasm of breast: Secondary | ICD-10-CM

## 2017-09-01 ENCOUNTER — Emergency Department (HOSPITAL_BASED_OUTPATIENT_CLINIC_OR_DEPARTMENT_OTHER)
Admit: 2017-09-01 | Discharge: 2017-09-01 | Disposition: A | Discharge status: Home / Self Care | Payer: BLUE CROSS/BLUE SHIELD

## 2017-09-01 ENCOUNTER — Emergency Department (HOSPITAL_COMMUNITY)
Admission: EM | Admit: 2017-09-01 | Discharge: 2017-09-01 | Disposition: A | Discharge status: Home / Self Care | Payer: BLUE CROSS/BLUE SHIELD | Attending: Emergency Medicine | Admitting: Emergency Medicine

## 2017-09-01 ENCOUNTER — Ambulatory Visit (HOSPITAL_COMMUNITY)
Admission: EM | Admit: 2017-09-01 | Discharge: 2017-09-01 | Disposition: A | Discharge status: Home / Self Care | Payer: BLUE CROSS/BLUE SHIELD | Source: Home / Self Care

## 2017-09-01 ENCOUNTER — Encounter (HOSPITAL_COMMUNITY): Payer: Self-pay | Admitting: *Deleted

## 2017-09-01 DIAGNOSIS — E039 Hypothyroidism, unspecified: Secondary | ICD-10-CM | POA: Diagnosis not present

## 2017-09-01 DIAGNOSIS — Z7901 Long term (current) use of anticoagulants: Secondary | ICD-10-CM | POA: Insufficient documentation

## 2017-09-01 DIAGNOSIS — Z86718 Personal history of other venous thrombosis and embolism: Secondary | ICD-10-CM | POA: Diagnosis not present

## 2017-09-01 DIAGNOSIS — M79661 Pain in right lower leg: Secondary | ICD-10-CM | POA: Diagnosis not present

## 2017-09-01 DIAGNOSIS — M79609 Pain in unspecified limb: Secondary | ICD-10-CM

## 2017-09-01 DIAGNOSIS — M25561 Pain in right knee: Secondary | ICD-10-CM | POA: Diagnosis not present

## 2017-09-01 HISTORY — DX: Obesity, unspecified: E66.9

## 2017-09-01 HISTORY — DX: Acute embolism and thrombosis of unspecified deep veins of unspecified lower extremity: I82.409

## 2017-09-01 NOTE — ED Provider Notes (Signed)
[facility] DEPT Provider Note   CSN: [id number] Arrival date & time: 09/01/17  1614     History   Chief Complaint Chief Complaint  Patient presents with  . Leg Pain    HPI Ariel Henry is a 66 y.o. female.  The history is provided by the patient.  Leg Pain   This is a recurrent problem. Episode onset: several days. Episode frequency: intermittent. Progression since onset: fluctuating. The pain is present in the right lower leg and right knee. The quality of the pain is described as aching. The pain is moderate. Pertinent negatives include no numbness, full range of motion and no stiffness. There has been no history of extremity trauma.   H/o DVT in right leg. Pain similar to that episode; concerned that it might be a new clot. Currently on Xarelto, compliant.   Reports that she moves furniture with her leg all the time; "it's a habit." No trauma. No fevers.   Past Medical History:  Diagnosis Date  . Arthritis   . DVT (deep venous thrombosis) ([place])   . Glaucoma   . Neuromuscular disorder ([facility])    carpal tunnel both wrists  . Obesity   . Pneumonia   . Thyroid disease     Patient Active Problem List   Diagnosis Date Noted  . Right leg pain 12/22/2016  . Glaucoma 12/22/2016  . Hypothyroidism 12/22/2016  . Pulmonary embolism ([place]) 12/21/2016  . Acute respiratory failure ([place]) 12/14/2016  . Special screening for malignant neoplasms, colon   . Lipoma of colon     Past Surgical History:  Procedure Laterality Date  . ABDOMINAL HYSTERECTOMY    . bladder tack    . COLONOSCOPY WITH PROPOFOL N/A 09/04/2016   Procedure: COLONOSCOPY WITH PROPOFOL;  Surgeon: Mauri Pole, MD;  Location: [facility] ENDOSCOPY;  Service: Endoscopy;  Laterality: N/A;  . EYE SURGERY     bilateral cataract extraction    OB History    No data available       Home Medications    Prior to Admission medications   Medication Sig Start Date End Date Taking? Authorizing Provider    albuterol (PROVENTIL HFA;VENTOLIN HFA) 108 (90 BASE) MCG/ACT inhaler Inhale 1-2 puffs into the lungs every 6 (six) hours as needed for wheezing. 07/12/13   Fredia Sorrow, MD  cholecalciferol (VITAMIN D) 1000 units tablet Take 1,000 Units by mouth daily.    [provider]  dorzolamide (TRUSOPT) 2 % ophthalmic solution 1 drop 3 (three) times daily.    [provider]  gabapentin (NEURONTIN) 300 MG capsule Take 300 mg by mouth at bedtime.    [provider]  gabapentin (NEURONTIN) 300 MG capsule Take 300 mg by mouth 2 (two) times daily. 600mg  in morning and afternoon, 300mg  at bedtime     [provider]  hydrOXYzine (ATARAX/VISTARIL) 25 MG tablet Take 25 mg by mouth 4 (four) times daily as needed for anxiety.    [provider]  latanoprost (XALATAN) 0.005 % ophthalmic solution Place 1 drop into both eyes at bedtime.    [provider]  Polyethyl Glycol-Propyl Glycol (SYSTANE ULTRA OP) Apply to eye.    [provider]  pyridOXINE (VITAMIN B-6) 100 MG tablet Take 100 mg by mouth daily.    [provider]  Rivaroxaban 15 & 20 MG TBPK Take as directed on package: Start with one 15mg  tablet by mouth twice a day with food. On Day 22, switch to one 20mg  tablet once a  day with food. 12/22/16   Mikhail, Velta Addison, DO  timolol (TIMOPTIC) 0.5 % ophthalmic solution 1 drop 2 (two) times daily.    [provider]  Timolol Maleate PF 0.5 % SOLN Apply 1 drop to eye every morning.    [provider]    Family History Family History  Problem Relation Age of Onset  . Colon cancer Neg Hx     Social History Social History  Substance Use Topics  . Smoking status: Never Smoker  . Smokeless tobacco: Never Used  . Alcohol use No     Allergies   Patient has no known allergies.   Review of Systems Review of Systems  Musculoskeletal: Negative for stiffness.  Neurological: Negative for numbness.  All other systems are  reviewed and are negative for acute change except as noted in the HPI    Physical Exam Updated Vital Signs BP 109/69 (BP Location: Right Arm)   Pulse 80   Temp 98 F (36.7 C) (Oral)   Resp 18   SpO2 95%   Physical Exam  Constitutional: She is oriented to person, place, and time. She appears well-developed and well-nourished. No distress.  HENT:  Head: Normocephalic and atraumatic.  Right Ear: External ear normal.  Left Ear: External ear normal.  Nose: Nose normal.  Eyes: Conjunctivae and EOM are normal. No scleral icterus.  Neck: Normal range of motion and phonation normal.  Cardiovascular: Normal rate and regular rhythm.   Pulmonary/Chest: Effort normal. No stridor. No respiratory distress.  Abdominal: She exhibits no distension.  Musculoskeletal: Normal range of motion. She exhibits no edema.       Right knee: She exhibits no swelling. No tenderness found.       Right lower leg: She exhibits no tenderness, no swelling and no edema.  Neurological: She is alert and oriented to person, place, and time.  Skin: She is not diaphoretic.  Psychiatric: She has a normal mood and affect. Her behavior is normal.  Vitals reviewed.    ED Treatments / Results  Labs (all labs ordered are listed, but only abnormal results are displayed) Labs Reviewed - No data to display  EKG  EKG Interpretation None       Radiology No results found.  Procedures Procedures (including critical care time)  Medications Ordered in ED Medications - No data to display   Initial Impression / Assessment and Plan / ED Course  I have reviewed the triage vital signs and the nursing notes.  Pertinent labs & imaging results that were available during my care of the patient were reviewed by me and considered in my medical decision making (see chart for details).     Ultrasound obtained in triage negative for DVT. Patient is currently asymptomatic. Pain likely secondary to overuse of that joint. Low  suspicion for septic arthritis, gout. No trauma indicating additional imaging.  The patient is safe for discharge with strict return precautions.   Final Clinical Impressions(s) / ED Diagnoses   Final diagnoses:  Right calf pain  Acute pain of right knee   Disposition: Discharge  Condition: Good  I have discussed the results, Dx and Tx plan with the patient who expressed understanding and agree(s) with the plan. Discharge instructions discussed at great length. The patient was given strict return precautions who verbalized understanding of the instructions. No further questions at time of discharge.    New Prescriptions   No medications on file    Follow Up: Benito Mccreedy, MD [address]  [place] [address] [phone]  Schedule an appointment as soon as possible for a visit  As needed      Judye Lorino, Grayce Sessions, MD 09/01/17 [phone]

## 2017-09-01 NOTE — ED Notes (Signed)
Pt departed in NAD, escorted in wheelchair by NT.  

## 2017-09-01 NOTE — ED Notes (Signed)
Paged vascular  

## 2017-09-01 NOTE — Progress Notes (Signed)
VASCULAR LAB PRELIMINARY  PRELIMINARY  PRELIMINARY  PRELIMINARY  Right lower extremity venous duplex completed.    Preliminary report:  There is no DVT or SVT noted in the right lower extremity.  DVT found 11/2016 appears resolved.   Attempted to call report to Triage nurse, but she was with a patient 17:45  Taryn Shellhammer, RVT 09/01/2017, 5:47 PM

## 2017-09-01 NOTE — ED Triage Notes (Signed)
Pt reports ongoing right lower leg pain and swelling, has been getting worse and difficult to sleep at night. Pt has hx of dvt in same leg and takes blood thinners as prescribed.

## 2017-12-23 ENCOUNTER — Other Ambulatory Visit: Payer: Self-pay

## 2017-12-23 ENCOUNTER — Encounter (HOSPITAL_COMMUNITY): Payer: Self-pay | Admitting: Family Medicine

## 2017-12-23 ENCOUNTER — Ambulatory Visit (HOSPITAL_COMMUNITY)
Admission: EM | Admit: 2017-12-23 | Discharge: 2017-12-23 | Disposition: A | Discharge status: Home / Self Care | Payer: BLUE CROSS/BLUE SHIELD | Attending: Family Medicine | Admitting: Family Medicine

## 2017-12-23 DIAGNOSIS — J069 Acute upper respiratory infection, unspecified: Secondary | ICD-10-CM

## 2017-12-23 MED ORDER — PREDNISONE 5 MG PO TABS: 5.0000 mg | ORAL_TABLET | Freq: Two times a day (BID) | ORAL | 0 refills | Status: DC

## 2017-12-23 NOTE — Discharge Instructions (Signed)
Continue your inhaler as you have been using it: 3 times a day at least.

## 2017-12-23 NOTE — ED Provider Notes (Signed)
[place]   [id number] 12/23/17 Arrival Time: 1821   SUBJECTIVE:  Ariel Henry is a 67 y.o. female who presents to the urgent care with complaint of upper respiratory syndrome.  She has what she calls chest congestion but does not cough because she takes cough drops.  This been ongoing for about 2 weeks.  No chest pain or fever, some shortness of breath on exertion.     Past Medical History:  Diagnosis Date  . Arthritis   . DVT (deep venous thrombosis) ([place])   . Glaucoma   . Neuromuscular disorder (HCC)    carpal tunnel both wrists  . Obesity   . Pneumonia   . Thyroid disease    Family History  Problem Relation Age of Onset  . Colon cancer Neg Hx    Social History   Socioeconomic History  . Marital status: Widowed    Spouse name: Not on file  . Number of children: Not on file  . Years of education: Not on file  . Highest education level: Not on file  Social Needs  . Financial resource strain: Not on file  . Food insecurity - worry: Not on file  . Food insecurity - inability: Not on file  . Transportation needs - medical: Not on file  . Transportation needs - non-medical: Not on file  Occupational History  . Not on file  Tobacco Use  . Smoking status: Never Smoker  . Smokeless tobacco: Never Used  Substance and Sexual Activity  . Alcohol use: No  . Drug use: No  . Sexual activity: Not on file  Other Topics Concern  . Not on file  Social History Narrative  . Not on file   Current Meds  Medication Sig  . albuterol (PROVENTIL HFA;VENTOLIN HFA) 108 (90 BASE) MCG/ACT inhaler Inhale 1-2 puffs into the lungs every 6 (six) hours as needed for wheezing.  . gabapentin (NEURONTIN) 300 MG capsule Take 300 mg by mouth at bedtime.  . hydrochlorothiazide (HYDRODIURIL) 25 MG tablet Take 25 mg by mouth daily.  . Rivaroxaban 15 & 20 MG TBPK Take as directed on package: Start with one 15mg  tablet by mouth twice a day with food. On Day 22, switch to one  20mg  tablet once a day with food.   No Known Allergies    ROS: As per HPI, remainder of ROS negative.   OBJECTIVE:   Vitals:   12/23/17 1955  BP: (!) 162/86  Pulse: 65  Temp: (!) 97.4 F (36.3 C)  TempSrc: Oral  SpO2: 98%     General appearance: alert; no distress; morbidly obese Eyes: PERRL; EOMI; conjunctiva normal HENT: normocephalic; atraumatic; TMs normal, canal normal, external ears normal without trauma; nasal mucosa normal; oral mucosa normal Neck: supple Lungs: clear to auscultation bilaterally Heart: regular rate and rhythm Back: no CVA tenderness Extremities: no cyanosis or edema; symmetrical with no gross deformities Skin: warm and dry Neurologic: normal gait; grossly normal Psychological: alert and cooperative; normal mood and affect      Labs:  Results for orders placed or performed during the hospital encounter of 12/21/16  Comprehensive metabolic panel  Result Value Ref Range   Sodium 141 135 - 145 mmol/L   Potassium 3.8 3.5 - 5.1 mmol/L   Chloride 107 101 - 111 mmol/L   CO2 25 22 - 32 mmol/L   Glucose, Bld 78 65 - 99 mg/dL   BUN 11 6 - 20 mg/dL   Creatinine, Ser 0.60 0.44 - 1.00  mg/dL   Calcium 9.3 8.9 - 10.3 mg/dL   Total Protein 7.0 6.5 - 8.1 g/dL   Albumin 3.6 3.5 - 5.0 g/dL   AST 16 15 - 41 U/L   ALT 12 (L) 14 - 54 U/L   Alkaline Phosphatase 59 38 - 126 U/L   Total Bilirubin 0.7 0.3 - 1.2 mg/dL   GFR calc non Af Amer >60 >60 mL/min   GFR calc Af Amer >60 >60 mL/min   Anion gap 9 5 - 15  CBC with Differential  Result Value Ref Range   WBC 6.5 4.0 - 10.5 K/uL   RBC 4.65 3.87 - 5.11 MIL/uL   Hemoglobin 12.2 12.0 - 15.0 g/dL   HCT 38.8 36.0 - 46.0 %   MCV 83.4 78.0 - 100.0 fL   MCH 26.2 26.0 - 34.0 pg   MCHC 31.4 30.0 - 36.0 g/dL   RDW 14.4 11.5 - 15.5 %   Platelets 200 150 - 400 K/uL   Neutrophils Relative % 72 %   Neutro Abs 4.7 1.7 - 7.7 K/uL   Lymphocytes Relative 20 %   Lymphs Abs 1.3 0.7 - 4.0 K/uL   Monocytes Relative  5 %   Monocytes Absolute 0.4 0.1 - 1.0 K/uL   Eosinophils Relative 3 %   Eosinophils Absolute 0.2 0.0 - 0.7 K/uL   Basophils Relative 0 %   Basophils Absolute 0.0 0.0 - 0.1 K/uL  D-dimer, quantitative (not at [facility])  Result Value Ref Range   D-Dimer, Quant >20.00 (H) 0.00 - 0.50 ug/mL-FEU  Heparin level (unfractionated)  Result Value Ref Range   Heparin Unfractionated 0.67 0.30 - 0.70 IU/mL  CBC  Result Value Ref Range   WBC 7.9 4.0 - 10.5 K/uL   RBC 4.71 3.87 - 5.11 MIL/uL   Hemoglobin 12.4 12.0 - 15.0 g/dL   HCT 38.8 36.0 - 46.0 %   MCV 82.4 78.0 - 100.0 fL   MCH 26.3 26.0 - 34.0 pg   MCHC 32.0 30.0 - 36.0 g/dL   RDW 13.9 11.5 - 15.5 %   Platelets 244 150 - 400 K/uL  CBC  Result Value Ref Range   WBC 7.7 4.0 - 10.5 K/uL   RBC 4.23 3.87 - 5.11 MIL/uL   Hemoglobin 11.3 (L) 12.0 - 15.0 g/dL   HCT 35.0 (L) 36.0 - 46.0 %   MCV 82.7 78.0 - 100.0 fL   MCH 26.7 26.0 - 34.0 pg   MCHC 32.3 30.0 - 36.0 g/dL   RDW 14.3 11.5 - 15.5 %   Platelets 239 150 - 400 K/uL  Basic metabolic panel  Result Value Ref Range   Sodium 140 135 - 145 mmol/L   Potassium 3.9 3.5 - 5.1 mmol/L   Chloride 106 101 - 111 mmol/L   CO2 26 22 - 32 mmol/L   Glucose, Bld 89 65 - 99 mg/dL   BUN 14 6 - 20 mg/dL   Creatinine, Ser 0.66 0.44 - 1.00 mg/dL   Calcium 9.0 8.9 - 10.3 mg/dL   GFR calc non Af Amer >60 >60 mL/min   GFR calc Af Amer >60 >60 mL/min   Anion gap 8 5 - 15  Troponin I (q 6hr x 3)  Result Value Ref Range   Troponin I 0.03 (HH) <0.03 ng/mL  Troponin I (q 6hr x 3)  Result Value Ref Range   Troponin I <0.03 <0.03 ng/mL  TSH  Result Value Ref Range   TSH 1.281 0.350 - 4.500  uIU/mL  Brain natriuretic peptide  Result Value Ref Range   B Natriuretic Peptide 13.6 0.0 - 100.0 pg/mL  CBC  Result Value Ref Range   WBC 6.9 4.0 - 10.5 K/uL   RBC 4.24 3.87 - 5.11 MIL/uL   Hemoglobin 11.1 (L) 12.0 - 15.0 g/dL   HCT 35.2 (L) 36.0 - 46.0 %   MCV 83.0 78.0 - 100.0 fL   MCH 26.2 26.0 - 34.0 pg    MCHC 31.5 30.0 - 36.0 g/dL   RDW 14.0 11.5 - 15.5 %   Platelets 264 150 - 400 K/uL  Basic metabolic panel  Result Value Ref Range   Sodium 140 135 - 145 mmol/L   Potassium 4.0 3.5 - 5.1 mmol/L   Chloride 105 101 - 111 mmol/L   CO2 29 22 - 32 mmol/L   Glucose, Bld 106 (H) 65 - 99 mg/dL   BUN 15 6 - 20 mg/dL   Creatinine, Ser 0.72 0.44 - 1.00 mg/dL   Calcium 8.8 (L) 8.9 - 10.3 mg/dL   GFR calc non Af Amer >60 >60 mL/min   GFR calc Af Amer >60 >60 mL/min   Anion gap 6 5 - 15  Troponin I  Result Value Ref Range   Troponin I <0.03 <0.03 ng/mL  I-Stat Arterial Blood Gas, ED - (order at [facility] and [facility] only)  Result Value Ref Range   pH, Arterial 7.423 7.350 - 7.450   pCO2 arterial 38.3 32.0 - 48.0 mmHg   pO2, Arterial 66.0 (L) 83.0 - 108.0 mmHg   Bicarbonate 25.0 20.0 - 28.0 mmol/L   TCO2 26 0 - 100 mmol/L   O2 Saturation 93.0 %   Acid-Base Excess 1.0 0.0 - 2.0 mmol/L   Patient temperature 98.6 F    Collection site RADIAL, ALLEN'S TEST ACCEPTABLE    Drawn by Operator    Sample type ARTERIAL   I-stat troponin, ED  Result Value Ref Range   Troponin i, poc 0.00 0.00 - 0.08 ng/mL   Comment 3          ECHOCARDIOGRAM COMPLETE  Result Value Ref Range   Weight 4,046.4 oz   Height 61 in   BP 105/86 mmHg    Labs Reviewed - No data to display  No results found.     ASSESSMENT & PLAN:  1. Upper respiratory tract infection, unspecified type     Meds ordered this encounter  Medications  . predniSONE (DELTASONE) 5 MG tablet    Sig: Take 1 tablet (5 mg total) by mouth 2 (two) times daily with a meal.    Dispense:  10 tablet    Refill:  0    Reviewed expectations re: course of current medical issues. Questions answered. Outlined signs and symptoms indicating need for more acute intervention. Patient verbalized understanding. After Visit Summary given.   Continue your inhaler as you have been using it: 3 times a day at least.    Robyn Haber, MD 12/23/17 [phone]

## 2017-12-23 NOTE — ED Triage Notes (Signed)
Pt reports chest congestion, nasal congestion and drainage, SOB and chills x2 weeks.

## 2018-01-16 ENCOUNTER — Other Ambulatory Visit: Payer: Self-pay | Admitting: Physician Assistant

## 2018-01-16 DIAGNOSIS — Z1231 Encounter for screening mammogram for malignant neoplasm of breast: Secondary | ICD-10-CM

## 2018-02-06 ENCOUNTER — Ambulatory Visit
Admission: RE | Admit: 2018-02-06 | Discharge: 2018-02-06 | Disposition: A | Discharge status: Home / Self Care | Payer: BLUE CROSS/BLUE SHIELD | Source: Ambulatory Visit | Attending: Physician Assistant | Admitting: Physician Assistant

## 2018-02-06 DIAGNOSIS — Z1231 Encounter for screening mammogram for malignant neoplasm of breast: Secondary | ICD-10-CM

## 2018-05-10 ENCOUNTER — Ambulatory Visit (HOSPITAL_COMMUNITY)
Admission: EM | Admit: 2018-05-10 | Discharge: 2018-05-10 | Disposition: A | Discharge status: Home / Self Care | Payer: BLUE CROSS/BLUE SHIELD | Attending: Family Medicine | Admitting: Family Medicine

## 2018-05-10 ENCOUNTER — Other Ambulatory Visit: Payer: Self-pay

## 2018-05-10 ENCOUNTER — Encounter (HOSPITAL_COMMUNITY): Payer: Self-pay | Admitting: *Deleted

## 2018-05-10 DIAGNOSIS — J069 Acute upper respiratory infection, unspecified: Secondary | ICD-10-CM | POA: Diagnosis not present

## 2018-05-10 MED ORDER — IPRATROPIUM BROMIDE 0.06 % NA SOLN: 2.0000 | Freq: Three times a day (TID) | NASAL | 12 refills | Status: AC

## 2018-05-10 MED ORDER — CETIRIZINE HCL 10 MG PO TABS: 10.0000 mg | ORAL_TABLET | Freq: Every day | ORAL | 0 refills | Status: AC

## 2018-05-10 NOTE — Discharge Instructions (Addendum)
Push fluids to ensure adequate hydration and keep secretions thin.  Tylenol as needed for pain or fevers.  Nasal spray 2-4 times a day to help with secretions. Daily zyrtec. If symptoms worsen or do not improve in the next week to return to be seen or to follow up with your PCP.

## 2018-05-10 NOTE — ED Provider Notes (Signed)
[name]    CSN: [id number] Arrival date & time: 05/10/18  1511     History   Chief Complaint Chief Complaint  Patient presents with  . URI    HPI Ariel Henry is a 67 y.o. female.   Ariel Henry presents with complaints of cough, sneezing and scratchy throat which started two days ago. No ear pain, denies gi/gu complains. No ear pain. Cough is productive. No chest pain  Or shortness of breath. No known ill contacts. States her chest feels congested. She has been using cough drops which have helped. States she is in on xarelto due to hx of PE therefore she was uncertain which medications she could take for her symptoms. Hx of dvt, glaucoma, pneumonia, thyroid disease, htn   ROS per HPI.      Past Medical History:  Diagnosis Date  . Arthritis   . DVT (deep venous thrombosis) ([place])   . Glaucoma   . Neuromuscular disorder (HCC)    carpal tunnel both wrists  . Obesity   . Pneumonia   . Thyroid disease     Patient Active Problem List   Diagnosis Date Noted  . Right leg pain 12/22/2016  . Glaucoma 12/22/2016  . Hypothyroidism 12/22/2016  . Pulmonary embolism ([place]) 12/21/2016  . Acute respiratory failure ([place]) 12/14/2016  . Special screening for malignant neoplasms, colon   . Lipoma of colon     Past Surgical History:  Procedure Laterality Date  . ABDOMINAL HYSTERECTOMY    . bladder tack    . COLONOSCOPY WITH PROPOFOL N/A 09/04/2016   Procedure: COLONOSCOPY WITH PROPOFOL;  Surgeon: Mauri Pole, MD;  Location: [facility] END[facility]COPY;  Service: Endoscopy;  Laterality: N/A;  . EYE SURGERY     bilateral cataract extraction    OB History   None      Home Medications    Prior to Admission medications   Medication Sig Start Date End Date Taking? Authorizing Provider  albuterol (PROVENTIL HFA;VENTOLIN HFA) 108 (90 BASE) MCG/ACT inhaler Inhale 1-2 puffs into the lungs every 6 (six) hours as needed for wheezing. 07/12/13  Yes Fredia Sorrow, MD    cholecalciferol (VITAMIN D) 1000 units tablet Take 1,000 Units by mouth daily.   Yes [provider]  dorzolamide (TRUSOPT) 2 % ophthalmic solution 1 drop 3 (three) times daily.   Yes [provider]  hydrochlorothiazide (HYDRODIURIL) 25 MG tablet Take 25 mg by mouth daily.   Yes [provider]  latanoprost (XALATAN) 0.005 % ophthalmic solution Place 1 drop into both eyes at bedtime.   Yes [provider]  Polyethyl Glycol-Propyl Glycol (SYSTANE ULTRA OP) Apply to eye.   Yes [provider]  pyridOXINE (VITAMIN B-6) 100 MG tablet Take 100 mg by mouth daily.   Yes [provider]  Rivaroxaban 15 & 20 MG TBPK Take as directed on package: Start with one 15mg  tablet by mouth twice a day with food. On Day 22, switch to one 20mg  tablet once a day with food. 12/22/16  Yes Mikhail, Maryann, DO  timolol (TIMOPTIC) 0.5 % ophthalmic solution 1 drop 2 (two) times daily.   Yes [provider]  Timolol Maleate PF 0.5 % SOLN Apply 1 drop to eye every morning.   Yes [provider]  cetirizine (ZYRTEC) 10 MG tablet Take 1 tablet (10 mg total) by mouth daily. 05/10/18   Zigmund Gottron, NP  gabapentin (NEURONTIN) 300 MG capsule Take 300 mg by mouth at bedtime.  [provider]  gabapentin (NEURONTIN) 300 MG capsule Take 300 mg by mouth 2 (two) times daily. 600mg  in morning and afternoon, 300mg  at bedtime     [provider]  hydrOXYzine (ATARAX/VISTARIL) 25 MG tablet Take 25 mg by mouth 4 (four) times daily as needed for anxiety.    [provider]  ipratropium (ATROVENT) 0.06 % nasal spray Place 2 sprays into both nostrils 3 (three) times daily. 05/10/18   Zigmund Gottron, NP  predniSONE (DELTASONE) 5 MG tablet Take 1 tablet (5 mg total) by mouth 2 (two) times daily with a meal. 12/23/17   Robyn Haber, MD    Family History Family History  Problem Relation Age of Onset  . Colon cancer Neg Hx     Social  History Social History   Tobacco Use  . Smoking status: Never Smoker  . Smokeless tobacco: Never Used  Substance Use Topics  . Alcohol use: No  . Drug use: No     Allergies   Patient has no known allergies.   Review of Systems Review of Systems   Physical Exam Triage Vital Signs ED Triage Vitals  Enc Vitals Group     BP 05/10/18 1625 126/70     Pulse Rate 05/10/18 1625 66     Resp 05/10/18 1625 18     Temp 05/10/18 1625 97.9 F (36.6 C)     Temp Source 05/10/18 1625 Temporal     SpO2 05/10/18 1625 100 %     Weight 05/10/18 1627 267 lb (121.1 kg)     Height 05/10/18 1627 5' (1.524 m)     Head Circumference --      Peak Flow --      Pain Score 05/10/18 1627 0     Pain Loc --      Pain Edu? --      Excl. in [place]? --    No data found.  Updated Vital Signs BP 126/70   Pulse 66   Temp 97.9 F (36.6 C) (Temporal)   Resp 18   Ht 5' (1.524 m)   Wt 267 lb (121.1 kg)   SpO2 100%   BMI 52.14 kg/m    Physical Exam  Constitutional: She is oriented to person, place, and time. She appears well-developed and well-nourished. No distress.  HENT:  Head: Normocephalic and atraumatic.  Right Ear: Tympanic membrane, external ear and ear canal normal.  Left Ear: Tympanic membrane, external ear and ear canal normal.  Nose: Mucosal edema and rhinorrhea present. Right sinus exhibits no maxillary sinus tenderness and no frontal sinus tenderness. Left sinus exhibits no maxillary sinus tenderness and no frontal sinus tenderness.  Mouth/Throat: Uvula is midline, oropharynx is clear and moist and mucous membranes are normal. No tonsillar exudate.  Eyes: Pupils are equal, round, and reactive to light. Conjunctivae and EOM are normal.  Cardiovascular: Normal rate, regular rhythm and normal heart sounds.  Pulmonary/Chest: Effort normal and breath sounds normal.  Neurological: She is alert and oriented to person, place, and time.  Skin: Skin is warm and dry.     UC Treatments /  Results  Labs (all labs ordered are listed, but only abnormal results are displayed) Labs Reviewed - No data to display  EKG None  Radiology No results found.  Procedures Procedures (including critical care time)  Medications Ordered in UC Medications - No data to display  Initial Impression / Assessment and Plan / UC Course  I have reviewed the triage vital signs  and the nursing notes.  Pertinent labs & imaging results that were available during my care of the patient were reviewed by me and considered in my medical decision making (see chart for details).     Non toxic in appearance. Benign physical findings. History and physical consistent with viral illness.  Supportive cares recommended. Return precautions provided. Patient verbalized understanding and agreeable to plan.   Final Clinical Impressions(s) / UC Diagnoses   Final diagnoses:  Viral upper respiratory tract infection     Discharge Instructions     Push fluids to ensure adequate hydration and keep secretions thin.  Tylenol as needed for pain or fevers.  Nasal spray 2-4 times a day to help with secretions. Daily zyrtec. If symptoms worsen or do not improve in the next week to return to be seen or to follow up with your PCP.      ED Prescriptions    Medication Sig Dispense Auth. Provider   cetirizine (ZYRTEC) 10 MG tablet Take 1 tablet (10 mg total) by mouth daily. 30 tablet Augusto Gamble B, NP   ipratropium (ATROVENT) 0.06 % nasal spray Place 2 sprays into both nostrils 3 (three) times daily. 15 mL Augusto Gamble B, NP     Controlled Substance Prescriptions [place] Controlled Substance Registry consulted? Not Applicable   Zigmund Gottron, NP 05/10/18 1721

## 2018-05-27 ENCOUNTER — Other Ambulatory Visit: Payer: Self-pay | Admitting: Endocrinology

## 2018-05-27 DIAGNOSIS — E010 Iodine-deficiency related diffuse (endemic) goiter: Secondary | ICD-10-CM

## 2018-06-09 ENCOUNTER — Ambulatory Visit
Admission: RE | Admit: 2018-06-09 | Discharge: 2018-06-09 | Disposition: A | Discharge status: Home / Self Care | Payer: BLUE CROSS/BLUE SHIELD | Source: Ambulatory Visit | Attending: Endocrinology | Admitting: Endocrinology

## 2018-06-09 DIAGNOSIS — E010 Iodine-deficiency related diffuse (endemic) goiter: Secondary | ICD-10-CM

## 2018-09-08 ENCOUNTER — Other Ambulatory Visit: Payer: Self-pay

## 2018-09-08 ENCOUNTER — Ambulatory Visit (HOSPITAL_COMMUNITY)
Admission: EM | Admit: 2018-09-08 | Discharge: 2018-09-08 | Disposition: A | Discharge status: Home / Self Care | Payer: Medicare HMO | Attending: Family Medicine | Admitting: Family Medicine

## 2018-09-08 ENCOUNTER — Encounter (HOSPITAL_COMMUNITY): Payer: Self-pay | Admitting: Emergency Medicine

## 2018-09-08 DIAGNOSIS — J069 Acute upper respiratory infection, unspecified: Secondary | ICD-10-CM | POA: Diagnosis not present

## 2018-09-08 NOTE — ED Provider Notes (Signed)
Ariel Henry   [id number] 09/08/18 Arrival Time: 4166  ASSESSMENT & PLAN:  1. Viral upper respiratory tract infection      Discharge Instructions     You may try over the counter AFRIN 12 hour nasal spray. Use for no more than 3 days.    Discussed typical duration of symptoms. OTC symptom care as needed. Ensure adequate fluid intake and rest. May f/u with PCP or here as needed.  Reviewed expectations re: course of current medical issues. Questions answered. Outlined signs and symptoms indicating need for more acute intervention. Patient verbalized understanding. After Visit Summary given.   SUBJECTIVE: History from: patient.  Ariel Henry is a 67 y.o. female who presents with complaint of nasal congestion, post-nasal drainage, and a persistent dry cough. Ear pressure also. Onset abrupt, 4 days ago. Overall with mild fatigue and without body aches. SOB: none. Wheezing: none. Fever: questions subjective. Overall normal PO intake without n/v. Sick contacts: no. No specific or significant aggravating or alleviating factors reported. OTC treatment: cold medications without much relief.  Received flu shot this year: no.  Social History   Tobacco Use  Smoking Status Never Smoker  Smokeless Tobacco Never Used    ROS: As per HPI.   OBJECTIVE:  Vitals:   09/08/18 1339  BP: (!) 145/98  Pulse: 63  Temp: 98 F (36.7 C)  TempSrc: Oral  SpO2: 99%     General appearance: alert; appears fatigued HEENT: nasal congestion; clear runny nose; throat irritation secondary to post-nasal drainage Neck: supple without LAD Lungs: unlabored respirations, symmetrical air entry; cough: absent; no respiratory distress Skin: warm and dry Psychological: alert and cooperative; normal mood and affect   No Known Allergies  Past Medical History:  Diagnosis Date  . Arthritis   . DVT (deep venous thrombosis) ([place])   . Glaucoma   . Neuromuscular disorder (HCC)    carpal tunnel both wrists  . Obesity   . Pneumonia   . Thyroid disease    Family History  Problem Relation Age of Onset  . Colon cancer Neg Hx    Social History   Socioeconomic History  . Marital status: Widowed    Spouse name: Not on file  . Number of children: Not on file  . Years of education: Not on file  . Highest education level: Not on file  Occupational History  . Not on file  Social Needs  . Financial resource strain: Not on file  . Food insecurity:    Worry: Not on file    Inability: Not on file  . Transportation needs:    Medical: Not on file    Non-medical: Not on file  Tobacco Use  . Smoking status: Never Smoker  . Smokeless tobacco: Never Used  Substance and Sexual Activity  . Alcohol use: No  . Drug use: No  . Sexual activity: Not on file  Lifestyle  . Physical activity:    Days per week: Not on file    Minutes per session: Not on file  . Stress: Not on file  Relationships  . Social connections:    Talks on phone: Not on file    Gets together: Not on file    Attends religious service: Not on file    Active member of club or organization: Not on file    Attends meetings of clubs or organizations: Not on file    Relationship status: Not on file  . Intimate partner violence:    Fear of  current or ex partner: Not on file    Emotionally abused: Not on file    Physically abused: Not on file    Forced sexual activity: Not on file  Other Topics Concern  . Not on file  Social History Narrative  . Not on file           Vanessa Kick, MD 09/17/18 1010

## 2018-09-08 NOTE — ED Triage Notes (Signed)
Pt complains of right sinus congestion and ear pressure since Thursday.  She also complains of "hot spells".

## 2018-09-08 NOTE — ED Notes (Signed)
Patient called for triage with no response.  

## 2018-09-08 NOTE — Discharge Instructions (Signed)
You may try over the counter AFRIN 12 hour nasal spray. Use for no more than 3 days.

## 2018-09-09 DIAGNOSIS — H66001 Acute suppurative otitis media without spontaneous rupture of ear drum, right ear: Secondary | ICD-10-CM | POA: Diagnosis not present

## 2018-09-09 DIAGNOSIS — I10 Essential (primary) hypertension: Secondary | ICD-10-CM | POA: Diagnosis not present

## 2018-09-09 DIAGNOSIS — Z131 Encounter for screening for diabetes mellitus: Secondary | ICD-10-CM | POA: Diagnosis not present

## 2018-09-09 DIAGNOSIS — E079 Disorder of thyroid, unspecified: Secondary | ICD-10-CM | POA: Diagnosis not present

## 2018-09-09 DIAGNOSIS — E559 Vitamin D deficiency, unspecified: Secondary | ICD-10-CM | POA: Diagnosis not present

## 2018-09-09 DIAGNOSIS — Z86711 Personal history of pulmonary embolism: Secondary | ICD-10-CM | POA: Diagnosis not present

## 2018-09-09 DIAGNOSIS — M1990 Unspecified osteoarthritis, unspecified site: Secondary | ICD-10-CM | POA: Diagnosis not present

## 2018-09-09 DIAGNOSIS — R202 Paresthesia of skin: Secondary | ICD-10-CM | POA: Diagnosis not present

## 2018-09-24 DIAGNOSIS — H401131 Primary open-angle glaucoma, bilateral, mild stage: Secondary | ICD-10-CM | POA: Diagnosis not present

## 2018-09-24 DIAGNOSIS — H47233 Glaucomatous optic atrophy, bilateral: Secondary | ICD-10-CM | POA: Diagnosis not present

## 2018-09-24 DIAGNOSIS — H35373 Puckering of macula, bilateral: Secondary | ICD-10-CM | POA: Diagnosis not present

## 2018-10-15 DIAGNOSIS — H47233 Glaucomatous optic atrophy, bilateral: Secondary | ICD-10-CM | POA: Diagnosis not present

## 2018-10-15 DIAGNOSIS — H401131 Primary open-angle glaucoma, bilateral, mild stage: Secondary | ICD-10-CM | POA: Diagnosis not present

## 2018-10-15 DIAGNOSIS — H35373 Puckering of macula, bilateral: Secondary | ICD-10-CM | POA: Diagnosis not present

## 2018-11-11 DIAGNOSIS — R202 Paresthesia of skin: Secondary | ICD-10-CM | POA: Diagnosis not present

## 2018-11-11 DIAGNOSIS — I10 Essential (primary) hypertension: Secondary | ICD-10-CM | POA: Diagnosis not present

## 2018-11-11 DIAGNOSIS — Z86711 Personal history of pulmonary embolism: Secondary | ICD-10-CM | POA: Diagnosis not present

## 2018-11-11 DIAGNOSIS — Z0000 Encounter for general adult medical examination without abnormal findings: Secondary | ICD-10-CM | POA: Diagnosis not present

## 2018-11-11 DIAGNOSIS — E559 Vitamin D deficiency, unspecified: Secondary | ICD-10-CM | POA: Diagnosis not present

## 2018-11-11 DIAGNOSIS — M1990 Unspecified osteoarthritis, unspecified site: Secondary | ICD-10-CM | POA: Diagnosis not present

## 2018-11-11 DIAGNOSIS — E079 Disorder of thyroid, unspecified: Secondary | ICD-10-CM | POA: Diagnosis not present

## 2019-01-16 ENCOUNTER — Other Ambulatory Visit: Payer: Self-pay | Admitting: Physician Assistant

## 2019-01-16 DIAGNOSIS — Z1231 Encounter for screening mammogram for malignant neoplasm of breast: Secondary | ICD-10-CM

## 2019-01-19 ENCOUNTER — Other Ambulatory Visit (HOSPITAL_COMMUNITY): Payer: Self-pay | Admitting: Internal Medicine

## 2019-01-19 DIAGNOSIS — E079 Disorder of thyroid, unspecified: Secondary | ICD-10-CM | POA: Diagnosis not present

## 2019-01-19 DIAGNOSIS — R52 Pain, unspecified: Secondary | ICD-10-CM

## 2019-01-19 DIAGNOSIS — R202 Paresthesia of skin: Secondary | ICD-10-CM | POA: Diagnosis not present

## 2019-01-19 DIAGNOSIS — I10 Essential (primary) hypertension: Secondary | ICD-10-CM | POA: Diagnosis not present

## 2019-01-19 DIAGNOSIS — Z86711 Personal history of pulmonary embolism: Secondary | ICD-10-CM | POA: Diagnosis not present

## 2019-01-19 DIAGNOSIS — M1990 Unspecified osteoarthritis, unspecified site: Secondary | ICD-10-CM | POA: Diagnosis not present

## 2019-01-19 DIAGNOSIS — E559 Vitamin D deficiency, unspecified: Secondary | ICD-10-CM | POA: Diagnosis not present

## 2019-01-20 ENCOUNTER — Ambulatory Visit (HOSPITAL_COMMUNITY)
Admission: RE | Admit: 2019-01-20 | Discharge: 2019-01-20 | Disposition: A | Discharge status: Home / Self Care | Payer: Medicare HMO | Source: Ambulatory Visit | Attending: Internal Medicine | Admitting: Internal Medicine

## 2019-01-20 DIAGNOSIS — R52 Pain, unspecified: Secondary | ICD-10-CM | POA: Diagnosis not present

## 2019-01-20 NOTE — Progress Notes (Signed)
Right lower extremity venous duplex has been completed. Preliminary results can be found in CV Proc through chart review.  Results were faxed to Dr. Vista Lawman.  01/20/19 9:22 AM Carlos Levering RVT

## 2019-01-29 DIAGNOSIS — H401131 Primary open-angle glaucoma, bilateral, mild stage: Secondary | ICD-10-CM | POA: Diagnosis not present

## 2019-02-02 DIAGNOSIS — Z0100 Encounter for examination of eyes and vision without abnormal findings: Secondary | ICD-10-CM | POA: Diagnosis not present

## 2019-02-02 DIAGNOSIS — Z0000 Encounter for general adult medical examination without abnormal findings: Secondary | ICD-10-CM | POA: Diagnosis not present

## 2019-02-02 DIAGNOSIS — Z131 Encounter for screening for diabetes mellitus: Secondary | ICD-10-CM | POA: Diagnosis not present

## 2019-02-02 DIAGNOSIS — Z0110 Encounter for examination of ears and hearing without abnormal findings: Secondary | ICD-10-CM | POA: Diagnosis not present

## 2019-02-02 DIAGNOSIS — Z86711 Personal history of pulmonary embolism: Secondary | ICD-10-CM | POA: Diagnosis not present

## 2019-02-02 DIAGNOSIS — I10 Essential (primary) hypertension: Secondary | ICD-10-CM | POA: Diagnosis not present

## 2019-02-02 DIAGNOSIS — E559 Vitamin D deficiency, unspecified: Secondary | ICD-10-CM | POA: Diagnosis not present

## 2019-02-02 DIAGNOSIS — M1990 Unspecified osteoarthritis, unspecified site: Secondary | ICD-10-CM | POA: Diagnosis not present

## 2019-02-02 DIAGNOSIS — R202 Paresthesia of skin: Secondary | ICD-10-CM | POA: Diagnosis not present

## 2019-02-02 DIAGNOSIS — E079 Disorder of thyroid, unspecified: Secondary | ICD-10-CM | POA: Diagnosis not present

## 2019-02-16 ENCOUNTER — Ambulatory Visit: Payer: Medicare HMO

## 2019-03-30 ENCOUNTER — Ambulatory Visit: Payer: Medicare HMO

## 2019-04-10 DIAGNOSIS — Z008 Encounter for other general examination: Secondary | ICD-10-CM | POA: Diagnosis not present

## 2019-05-04 DIAGNOSIS — H16223 Keratoconjunctivitis sicca, not specified as Sjogren's, bilateral: Secondary | ICD-10-CM | POA: Diagnosis not present

## 2019-05-04 DIAGNOSIS — H1045 Other chronic allergic conjunctivitis: Secondary | ICD-10-CM | POA: Diagnosis not present

## 2019-05-04 DIAGNOSIS — H401131 Primary open-angle glaucoma, bilateral, mild stage: Secondary | ICD-10-CM | POA: Diagnosis not present

## 2019-05-04 DIAGNOSIS — H11153 Pinguecula, bilateral: Secondary | ICD-10-CM | POA: Diagnosis not present

## 2019-05-04 DIAGNOSIS — H0102A Squamous blepharitis right eye, upper and lower eyelids: Secondary | ICD-10-CM | POA: Diagnosis not present

## 2019-05-04 DIAGNOSIS — H0102B Squamous blepharitis left eye, upper and lower eyelids: Secondary | ICD-10-CM | POA: Diagnosis not present

## 2019-05-04 DIAGNOSIS — H35373 Puckering of macula, bilateral: Secondary | ICD-10-CM | POA: Diagnosis not present

## 2019-05-04 DIAGNOSIS — H18413 Arcus senilis, bilateral: Secondary | ICD-10-CM | POA: Diagnosis not present

## 2019-05-11 DIAGNOSIS — R0602 Shortness of breath: Secondary | ICD-10-CM | POA: Diagnosis not present

## 2019-05-11 DIAGNOSIS — R202 Paresthesia of skin: Secondary | ICD-10-CM | POA: Diagnosis not present

## 2019-05-11 DIAGNOSIS — E079 Disorder of thyroid, unspecified: Secondary | ICD-10-CM | POA: Diagnosis not present

## 2019-05-11 DIAGNOSIS — Z86711 Personal history of pulmonary embolism: Secondary | ICD-10-CM | POA: Diagnosis not present

## 2019-05-11 DIAGNOSIS — E559 Vitamin D deficiency, unspecified: Secondary | ICD-10-CM | POA: Diagnosis not present

## 2019-05-11 DIAGNOSIS — I10 Essential (primary) hypertension: Secondary | ICD-10-CM | POA: Diagnosis not present

## 2019-05-11 DIAGNOSIS — M1990 Unspecified osteoarthritis, unspecified site: Secondary | ICD-10-CM | POA: Diagnosis not present

## 2019-05-15 ENCOUNTER — Other Ambulatory Visit: Payer: Self-pay

## 2019-05-15 ENCOUNTER — Ambulatory Visit
Admission: RE | Admit: 2019-05-15 | Discharge: 2019-05-15 | Disposition: A | Discharge status: Home / Self Care | Payer: Medicare HMO | Source: Ambulatory Visit | Attending: Physician Assistant | Admitting: Physician Assistant

## 2019-05-15 DIAGNOSIS — Z1231 Encounter for screening mammogram for malignant neoplasm of breast: Secondary | ICD-10-CM | POA: Diagnosis not present

## 2019-06-01 DIAGNOSIS — R7301 Impaired fasting glucose: Secondary | ICD-10-CM | POA: Diagnosis not present

## 2019-06-01 DIAGNOSIS — E010 Iodine-deficiency related diffuse (endemic) goiter: Secondary | ICD-10-CM | POA: Diagnosis not present

## 2019-06-01 DIAGNOSIS — E669 Obesity, unspecified: Secondary | ICD-10-CM | POA: Diagnosis not present

## 2019-06-09 ENCOUNTER — Other Ambulatory Visit: Payer: Self-pay | Admitting: Endocrinology

## 2019-06-09 DIAGNOSIS — E010 Iodine-deficiency related diffuse (endemic) goiter: Secondary | ICD-10-CM

## 2019-06-12 ENCOUNTER — Ambulatory Visit
Admission: RE | Admit: 2019-06-12 | Discharge: 2019-06-12 | Disposition: A | Discharge status: Home / Self Care | Payer: Medicare HMO | Source: Ambulatory Visit | Attending: Endocrinology | Admitting: Endocrinology

## 2019-06-12 DIAGNOSIS — E042 Nontoxic multinodular goiter: Secondary | ICD-10-CM | POA: Diagnosis not present

## 2019-06-12 DIAGNOSIS — E010 Iodine-deficiency related diffuse (endemic) goiter: Secondary | ICD-10-CM

## 2019-06-22 DIAGNOSIS — E559 Vitamin D deficiency, unspecified: Secondary | ICD-10-CM | POA: Diagnosis not present

## 2019-06-22 DIAGNOSIS — E079 Disorder of thyroid, unspecified: Secondary | ICD-10-CM | POA: Diagnosis not present

## 2019-06-22 DIAGNOSIS — R0602 Shortness of breath: Secondary | ICD-10-CM | POA: Diagnosis not present

## 2019-06-22 DIAGNOSIS — R202 Paresthesia of skin: Secondary | ICD-10-CM | POA: Diagnosis not present

## 2019-06-22 DIAGNOSIS — Z86711 Personal history of pulmonary embolism: Secondary | ICD-10-CM | POA: Diagnosis not present

## 2019-06-22 DIAGNOSIS — M1990 Unspecified osteoarthritis, unspecified site: Secondary | ICD-10-CM | POA: Diagnosis not present

## 2019-06-22 DIAGNOSIS — I10 Essential (primary) hypertension: Secondary | ICD-10-CM | POA: Diagnosis not present

## 2019-06-22 DIAGNOSIS — Z131 Encounter for screening for diabetes mellitus: Secondary | ICD-10-CM | POA: Diagnosis not present

## 2019-07-19 DIAGNOSIS — R69 Illness, unspecified: Secondary | ICD-10-CM | POA: Diagnosis not present

## 2019-08-17 DIAGNOSIS — H35373 Puckering of macula, bilateral: Secondary | ICD-10-CM | POA: Diagnosis not present

## 2019-08-17 DIAGNOSIS — H0102B Squamous blepharitis left eye, upper and lower eyelids: Secondary | ICD-10-CM | POA: Diagnosis not present

## 2019-08-17 DIAGNOSIS — H26491 Other secondary cataract, right eye: Secondary | ICD-10-CM | POA: Diagnosis not present

## 2019-08-17 DIAGNOSIS — H401131 Primary open-angle glaucoma, bilateral, mild stage: Secondary | ICD-10-CM | POA: Diagnosis not present

## 2019-08-17 DIAGNOSIS — Z961 Presence of intraocular lens: Secondary | ICD-10-CM | POA: Diagnosis not present

## 2019-08-17 DIAGNOSIS — H0102A Squamous blepharitis right eye, upper and lower eyelids: Secondary | ICD-10-CM | POA: Diagnosis not present

## 2019-08-17 DIAGNOSIS — H16223 Keratoconjunctivitis sicca, not specified as Sjogren's, bilateral: Secondary | ICD-10-CM | POA: Diagnosis not present

## 2019-08-17 DIAGNOSIS — H1045 Other chronic allergic conjunctivitis: Secondary | ICD-10-CM | POA: Diagnosis not present

## 2019-09-07 DIAGNOSIS — H401131 Primary open-angle glaucoma, bilateral, mild stage: Secondary | ICD-10-CM | POA: Diagnosis not present

## 2019-09-07 DIAGNOSIS — Z961 Presence of intraocular lens: Secondary | ICD-10-CM | POA: Diagnosis not present

## 2019-09-07 DIAGNOSIS — H26491 Other secondary cataract, right eye: Secondary | ICD-10-CM | POA: Diagnosis not present

## 2019-09-07 DIAGNOSIS — H0102B Squamous blepharitis left eye, upper and lower eyelids: Secondary | ICD-10-CM | POA: Diagnosis not present

## 2019-09-07 DIAGNOSIS — H0102A Squamous blepharitis right eye, upper and lower eyelids: Secondary | ICD-10-CM | POA: Diagnosis not present

## 2019-09-08 DIAGNOSIS — I10 Essential (primary) hypertension: Secondary | ICD-10-CM | POA: Diagnosis not present

## 2019-09-08 DIAGNOSIS — Z131 Encounter for screening for diabetes mellitus: Secondary | ICD-10-CM | POA: Diagnosis not present

## 2019-09-08 DIAGNOSIS — H6501 Acute serous otitis media, right ear: Secondary | ICD-10-CM | POA: Diagnosis not present

## 2019-09-08 DIAGNOSIS — Z86711 Personal history of pulmonary embolism: Secondary | ICD-10-CM | POA: Diagnosis not present

## 2019-09-08 DIAGNOSIS — E559 Vitamin D deficiency, unspecified: Secondary | ICD-10-CM | POA: Diagnosis not present

## 2019-09-08 DIAGNOSIS — E079 Disorder of thyroid, unspecified: Secondary | ICD-10-CM | POA: Diagnosis not present

## 2019-09-08 DIAGNOSIS — R0602 Shortness of breath: Secondary | ICD-10-CM | POA: Diagnosis not present

## 2019-09-08 DIAGNOSIS — R202 Paresthesia of skin: Secondary | ICD-10-CM | POA: Diagnosis not present

## 2019-09-08 DIAGNOSIS — M1990 Unspecified osteoarthritis, unspecified site: Secondary | ICD-10-CM | POA: Diagnosis not present

## 2019-09-17 DIAGNOSIS — H6523 Chronic serous otitis media, bilateral: Secondary | ICD-10-CM | POA: Diagnosis not present

## 2019-09-17 DIAGNOSIS — I10 Essential (primary) hypertension: Secondary | ICD-10-CM | POA: Diagnosis not present

## 2019-09-17 DIAGNOSIS — R202 Paresthesia of skin: Secondary | ICD-10-CM | POA: Diagnosis not present

## 2019-09-17 DIAGNOSIS — E079 Disorder of thyroid, unspecified: Secondary | ICD-10-CM | POA: Diagnosis not present

## 2019-09-17 DIAGNOSIS — E559 Vitamin D deficiency, unspecified: Secondary | ICD-10-CM | POA: Diagnosis not present

## 2019-09-17 DIAGNOSIS — Z86711 Personal history of pulmonary embolism: Secondary | ICD-10-CM | POA: Diagnosis not present

## 2019-09-17 DIAGNOSIS — M1990 Unspecified osteoarthritis, unspecified site: Secondary | ICD-10-CM | POA: Diagnosis not present

## 2019-09-17 DIAGNOSIS — R0602 Shortness of breath: Secondary | ICD-10-CM | POA: Diagnosis not present

## 2019-09-18 ENCOUNTER — Emergency Department (HOSPITAL_COMMUNITY)
Admission: EM | Admit: 2019-09-18 | Discharge: 2019-09-18 | Disposition: A | Discharge status: Home / Self Care | Payer: Medicare HMO | Attending: Emergency Medicine | Admitting: Emergency Medicine

## 2019-09-18 ENCOUNTER — Other Ambulatory Visit: Payer: Self-pay

## 2019-09-18 ENCOUNTER — Encounter (HOSPITAL_COMMUNITY): Payer: Self-pay

## 2019-09-18 DIAGNOSIS — R6883 Chills (without fever): Secondary | ICD-10-CM | POA: Diagnosis not present

## 2019-09-18 DIAGNOSIS — E039 Hypothyroidism, unspecified: Secondary | ICD-10-CM | POA: Insufficient documentation

## 2019-09-18 DIAGNOSIS — Z79899 Other long term (current) drug therapy: Secondary | ICD-10-CM | POA: Insufficient documentation

## 2019-09-18 DIAGNOSIS — Z20828 Contact with and (suspected) exposure to other viral communicable diseases: Secondary | ICD-10-CM | POA: Insufficient documentation

## 2019-09-18 DIAGNOSIS — Z03818 Encounter for observation for suspected exposure to other biological agents ruled out: Secondary | ICD-10-CM | POA: Diagnosis not present

## 2019-09-18 LAB — CBC WITH DIFFERENTIAL/PLATELET
Abs Immature Granulocytes: 0.02 10*3/uL (ref 0.00–0.07)
Basophils Absolute: 0 10*3/uL (ref 0.0–0.1)
Basophils Relative: 1 %
Eosinophils Absolute: 0.1 10*3/uL (ref 0.0–0.5)
Eosinophils Relative: 1 %
HCT: 41.9 % (ref 36.0–46.0)
Hemoglobin: 13.1 g/dL (ref 12.0–15.0)
Immature Granulocytes: 0 %
Lymphocytes Relative: 30 %
Lymphs Abs: 1.9 10*3/uL (ref 0.7–4.0)
MCH: 25.7 pg — ABNORMAL LOW (ref 26.0–34.0)
MCHC: 31.3 g/dL (ref 30.0–36.0)
MCV: 82.2 fL (ref 80.0–100.0)
Monocytes Absolute: 0.4 10*3/uL (ref 0.1–1.0)
Monocytes Relative: 6 %
Neutro Abs: 3.9 10*3/uL (ref 1.7–7.7)
Neutrophils Relative %: 62 %
Platelets: 291 10*3/uL (ref 150–400)
RBC: 5.1 MIL/uL (ref 3.87–5.11)
RDW: 15 % (ref 11.5–15.5)
WBC: 6.3 10*3/uL (ref 4.0–10.5)
nRBC: 0 % (ref 0.0–0.2)

## 2019-09-18 LAB — BASIC METABOLIC PANEL
Anion gap: 9 (ref 5–15)
BUN: 13 mg/dL (ref 8–23)
CO2: 28 mmol/L (ref 22–32)
Calcium: 9.3 mg/dL (ref 8.9–10.3)
Chloride: 103 mmol/L (ref 98–111)
Creatinine, Ser: 0.66 mg/dL (ref 0.44–1.00)
GFR calc Af Amer: >60 mL/min (ref >60)
GFR calc non Af Amer: >60 mL/min (ref >60)
Glucose, Bld: 86 mg/dL (ref 70–99)
Potassium: 3.8 mmol/L (ref 3.5–5.1)
Sodium: 140 mmol/L (ref 135–145)

## 2019-09-18 LAB — URINALYSIS, ROUTINE W REFLEX MICROSCOPIC
Bilirubin Urine: NEGATIVE
Glucose, UA: NEGATIVE mg/dL
Ketones, ur: 5 mg/dL — AB
Nitrite: NEGATIVE
Protein, ur: 30 mg/dL — AB
Specific Gravity, Urine: 1.03 (ref 1.005–1.030)
pH: 5 (ref 5.0–8.0)

## 2019-09-18 LAB — TSH: TSH: 0.898 u[IU]/mL (ref 0.350–4.500)

## 2019-09-18 NOTE — Discharge Instructions (Signed)
You were seen today for chills.  Your thyroid function and other lab studies were normal.  You are not anemic.  You need to follow-up with your primary care doctor for further evaluation and treatment.  If you have any chest pain, shortness of breath, fevers please return to the emergency department.

## 2019-09-18 NOTE — ED Notes (Signed)
Patient verbalizes understanding of discharge instructions. Opportunity for questioning and answers were provided. Armband removed by staff, pt discharged from ED.  

## 2019-09-18 NOTE — ED Triage Notes (Addendum)
Pt arrives POV for eval of chills, states onset earlier this week. Denies associated sx, just states she feels poorly. Report she believes she has an "ear infection". Has not taken temperature at home w/ a thermometer

## 2019-09-18 NOTE — ED Provider Notes (Signed)
[facility] EMERGENCY DEPARTMENT Provider Note   CSN: [id number] Arrival date & time: 09/18/19  1319     History   Chief Complaint Chief Complaint  Patient presents with  . Chills    HPI Ariel Henry is a 68 y.o. female.     Patient is [age]-year-old female with past medical history of DVT, obesity, thyroid disease presenting to the emergency department for chills.  Patient reports that this is been going on for about 1 week.  Reports that she feels cold and then she feels hot.  She denies any cough or other URI symptoms.  She reports that she has a pressure feeling in her right ear but no other symptoms.  Reports that she felt the symptoms similar in the past when she had a menstrual cycle but has not had a menstrual cycle since she was in her 81s.  She does not have any vaginal bleeding or discharge.  Denies any chest pain, shortness of breath, abdominal pain, nausea, vomiting, dysuria, hematuria, vaginal bleeding, headache, numbness, tingling, blurry vision, syncope, vision changes.     Past Medical History:  Diagnosis Date  . Arthritis   . DVT (deep venous thrombosis) ([place])   . Glaucoma   . Neuromuscular disorder (HCC)    carpal tunnel both wrists  . Obesity   . Pneumonia   . Thyroid disease     Patient Active Problem List   Diagnosis Date Noted  . Right leg pain 12/22/2016  . Glaucoma 12/22/2016  . Hypothyroidism 12/22/2016  . Pulmonary embolism ([place]) 12/21/2016  . Acute respiratory failure ([place]) 12/14/2016  . Special screening for malignant neoplasms, colon   . Lipoma of colon     Past Surgical History:  Procedure Laterality Date  . ABDOMINAL HYSTERECTOMY    . bladder tack    . COLONOSCOPY WITH PROPOFOL N/A 09/04/2016   Procedure: COLONOSCOPY WITH PROPOFOL;  Surgeon: Mauri Pole, MD;  Location: [facility];  Service: Endoscopy;  Laterality: N/A;  . EYE SURGERY     bilateral cataract extraction     OB History   No  obstetric history on file.      Home Medications    Prior to Admission medications   Medication Sig Start Date End Date Taking? Authorizing Provider  albuterol (PROVENTIL HFA;VENTOLIN HFA) 108 (90 BASE) MCG/ACT inhaler Inhale 1-2 puffs into the lungs every 6 (six) hours as needed for wheezing. 07/12/13   Fredia Sorrow, MD  cetirizine (ZYRTEC) 10 MG tablet Take 1 tablet (10 mg total) by mouth daily. 05/10/18   Zigmund Gottron, NP  cholecalciferol (VITAMIN D) 1000 units tablet Take 1,000 Units by mouth daily.    [provider]  dorzolamide (TRUSOPT) 2 % ophthalmic solution 1 drop 3 (three) times daily.    [provider]  gabapentin (NEURONTIN) 300 MG capsule Take 300 mg by mouth at bedtime.    [provider]  gabapentin (NEURONTIN) 300 MG capsule Take 300 mg by mouth 2 (two) times daily. 600mg  in morning and afternoon, 300mg  at bedtime     [provider]  hydrochlorothiazide (HYDRODIURIL) 25 MG tablet Take 25 mg by mouth daily.    [provider]  hydrOXYzine (ATARAX/VISTARIL) 25 MG tablet Take 25 mg by mouth 4 (four) times daily as needed for anxiety.    [provider]  ipratropium (ATROVENT) 0.06 % nasal spray Place 2 sprays into both nostrils 3 (three) times daily. 05/10/18   Zigmund Gottron, NP  latanoprost (XALATAN) 0.005 % ophthalmic solution Place 1 drop into both eyes at bedtime.    [provider]  Polyethyl Glycol-Propyl Glycol (SYSTANE ULTRA OP) Apply to eye.    [provider]  predniSONE (DELTASONE) 5 MG tablet Take 1 tablet (5 mg total) by mouth 2 (two) times daily with a meal. 12/23/17   Robyn Haber, MD  pyridOXINE (VITAMIN B-6) 100 MG tablet Take 100 mg by mouth daily.    [provider]  Rivaroxaban 15 & 20 MG TBPK Take as directed on package: Start with one 15mg  tablet by mouth twice a day with food. On Day 22, switch to one 20mg  tablet once a day with food. 12/22/16   Mikhail, Velta Addison, DO   timolol (TIMOPTIC) 0.5 % ophthalmic solution 1 drop 2 (two) times daily.    [provider]  Timolol Maleate PF 0.5 % SOLN Apply 1 drop to eye every morning.    [provider]    Family History Family History  Problem Relation Age of Onset  . Colon cancer Neg Hx     Social History Social History   Tobacco Use  . Smoking status: Never Smoker  . Smokeless tobacco: Never Used  Substance Use Topics  . Alcohol use: No  . Drug use: No     Allergies   Patient has no known allergies.   Review of Systems Review of Systems  Constitutional: Positive for chills. Negative for activity change, appetite change, diaphoresis, fatigue, fever and unexpected weight change.  HENT: Positive for ear pain. Negative for congestion, drooling, ear discharge, sinus pressure, sinus pain, sneezing, sore throat, tinnitus and trouble swallowing.   Eyes: Negative for pain and visual disturbance.  Respiratory: Negative for cough, chest tightness and shortness of breath.   Cardiovascular: Negative for chest pain and palpitations.  Gastrointestinal: Negative for abdominal pain, diarrhea, nausea and vomiting.  Endocrine: Negative for polyuria.  Genitourinary: Negative for dysuria, hematuria, pelvic pain, vaginal bleeding and vaginal discharge.  Musculoskeletal: Negative for arthralgias and back pain.  Skin: Negative for color change and rash.  Neurological: Negative for dizziness, seizures, syncope and headaches.  Psychiatric/Behavioral: Negative for confusion and sleep disturbance.  All other systems reviewed and are negative.    Physical Exam Updated Vital Signs BP (!) 157/84 (BP Location: Right Arm)   Pulse 70   Temp 99.1 F (37.3 C) (Oral)   Resp (!) 24   Ht 5\' 1"  (1.549 m)   Wt 117 kg   SpO2 98%   BMI 48.75 kg/m   Physical Exam Vitals signs and nursing note reviewed.  Constitutional:      Appearance: Normal appearance.  HENT:     Head: Normocephalic.     Right Ear:  Tympanic membrane normal.     Left Ear: Tympanic membrane normal.     Nose: Nose normal. No congestion or rhinorrhea.     Mouth/Throat:     Mouth: Mucous membranes are moist.  Eyes:     Conjunctiva/sclera: Conjunctivae normal.  Cardiovascular:     Rate and Rhythm: Normal rate and regular rhythm.     Pulses: Normal pulses.     Heart sounds: Normal heart sounds.  Pulmonary:     Effort: Pulmonary effort is normal.  Abdominal:     General: Abdomen is flat. Bowel sounds are normal.  Skin:    General: Skin is warm and dry.     Capillary Refill: Capillary refill takes less than 2 seconds.  Neurological:  General: No focal deficit present.     Mental Status: She is alert.     Cranial Nerves: No cranial nerve deficit.     Sensory: No sensory deficit.     Motor: No weakness.  Psychiatric:        Mood and Affect: Mood normal.      ED Treatments / Results  Labs (all labs ordered are listed, but only abnormal results are displayed) Labs Reviewed  CBC WITH DIFFERENTIAL/PLATELET - Abnormal; Notable for the following components:      Result Value   MCH 25.7 (*)    All other components within normal limits  URINALYSIS, ROUTINE W REFLEX MICROSCOPIC - Abnormal; Notable for the following components:   Hgb urine dipstick SMALL (*)    Ketones, ur 5 (*)    Protein, ur 30 (*)    Leukocytes,Ua SMALL (*)    Bacteria, UA RARE (*)    All other components within normal limits  URINE CULTURE  SARS CORONAVIRUS 2 (TAT 6-24 HRS)  BASIC METABOLIC PANEL  TSH    EKG None  Radiology No results found.  Procedures Procedures (including critical care time)  Medications Ordered in ED Medications - No data to display   Initial Impression / Assessment and Plan / ED Course  I have reviewed the triage vital signs and the nursing notes.  Pertinent labs & imaging results that were available during my care of the patient were reviewed by me and considered in my medical decision making (see  chart for details).  Clinical Course as of Sep 18 1935  Fri Sep 18, 2019  1623 Patient with 1 week hx of chills and L ear pain, denies all other sx. Hx of thyroid disorder. Will obtain basic labs and TSH. Exam negative. If workup negative will consider covid test and f/u pmd   [KM]    Clinical Course User Index [KM] Alveria Apley, PA-C       Based on review of vitals, medical screening exam, lab work and/or imaging, there does not appear to be an acute, emergent etiology for the patient's symptoms. Counseled pt on good return precautions and encouraged both PCP and ED follow-up as needed.  Prior to discharge, I also discussed incidental imaging findings with patient in detail and advised appropriate, recommended follow-up in detail.  Clinical Impression: 1. Chills     Disposition: Discharge  Prior to providing a prescription for a controlled substance, I independently reviewed the patient's recent prescription history on the [place]. The patient had no recent or regular prescriptions and was deemed appropriate for a brief, less than 3 day prescription of narcotic for acute analgesia.  This note was prepared with assistance of Systems analyst. Occasional wrong-word or sound-a-like substitutions may have occurred due to the inherent limitations of voice recognition software.   Final Clinical Impressions(s) / ED Diagnoses   Final diagnoses:  [place]    ED Discharge Orders    None       Kristine Royal 09/18/19 1936    Blanchie Dessert, MD 09/19/19 1529

## 2019-09-19 LAB — URINE CULTURE

## 2019-09-19 LAB — SARS CORONAVIRUS 2 (TAT 6-24 HRS): SARS Coronavirus 2: NEGATIVE

## 2019-09-21 DIAGNOSIS — H18413 Arcus senilis, bilateral: Secondary | ICD-10-CM | POA: Diagnosis not present

## 2019-09-21 DIAGNOSIS — H0102A Squamous blepharitis right eye, upper and lower eyelids: Secondary | ICD-10-CM | POA: Diagnosis not present

## 2019-09-21 DIAGNOSIS — H1045 Other chronic allergic conjunctivitis: Secondary | ICD-10-CM | POA: Diagnosis not present

## 2019-09-21 DIAGNOSIS — H26491 Other secondary cataract, right eye: Secondary | ICD-10-CM | POA: Diagnosis not present

## 2019-09-21 DIAGNOSIS — Z961 Presence of intraocular lens: Secondary | ICD-10-CM | POA: Diagnosis not present

## 2019-09-21 DIAGNOSIS — H11153 Pinguecula, bilateral: Secondary | ICD-10-CM | POA: Diagnosis not present

## 2019-09-21 DIAGNOSIS — H16223 Keratoconjunctivitis sicca, not specified as Sjogren's, bilateral: Secondary | ICD-10-CM | POA: Diagnosis not present

## 2019-09-21 DIAGNOSIS — H35373 Puckering of macula, bilateral: Secondary | ICD-10-CM | POA: Diagnosis not present

## 2019-09-21 DIAGNOSIS — H401131 Primary open-angle glaucoma, bilateral, mild stage: Secondary | ICD-10-CM | POA: Diagnosis not present

## 2019-09-21 DIAGNOSIS — H0102B Squamous blepharitis left eye, upper and lower eyelids: Secondary | ICD-10-CM | POA: Diagnosis not present

## 2019-09-22 DIAGNOSIS — R202 Paresthesia of skin: Secondary | ICD-10-CM | POA: Diagnosis not present

## 2019-09-22 DIAGNOSIS — E559 Vitamin D deficiency, unspecified: Secondary | ICD-10-CM | POA: Diagnosis not present

## 2019-09-22 DIAGNOSIS — M1990 Unspecified osteoarthritis, unspecified site: Secondary | ICD-10-CM | POA: Diagnosis not present

## 2019-09-22 DIAGNOSIS — E079 Disorder of thyroid, unspecified: Secondary | ICD-10-CM | POA: Diagnosis not present

## 2019-09-22 DIAGNOSIS — Z86711 Personal history of pulmonary embolism: Secondary | ICD-10-CM | POA: Diagnosis not present

## 2019-09-22 DIAGNOSIS — I10 Essential (primary) hypertension: Secondary | ICD-10-CM | POA: Diagnosis not present

## 2019-09-28 ENCOUNTER — Emergency Department (HOSPITAL_COMMUNITY): Payer: Medicare HMO

## 2019-09-28 ENCOUNTER — Encounter (HOSPITAL_COMMUNITY): Payer: Self-pay

## 2019-09-28 ENCOUNTER — Emergency Department (HOSPITAL_COMMUNITY)
Admission: EM | Admit: 2019-09-28 | Discharge: 2019-09-29 | Disposition: A | Discharge status: Home / Self Care | Payer: Medicare HMO | Attending: Emergency Medicine | Admitting: Emergency Medicine

## 2019-09-28 ENCOUNTER — Other Ambulatory Visit: Payer: Self-pay

## 2019-09-28 DIAGNOSIS — Z79899 Other long term (current) drug therapy: Secondary | ICD-10-CM | POA: Diagnosis not present

## 2019-09-28 DIAGNOSIS — R509 Fever, unspecified: Secondary | ICD-10-CM | POA: Diagnosis not present

## 2019-09-28 DIAGNOSIS — Z20828 Contact with and (suspected) exposure to other viral communicable diseases: Secondary | ICD-10-CM | POA: Diagnosis not present

## 2019-09-28 DIAGNOSIS — E039 Hypothyroidism, unspecified: Secondary | ICD-10-CM | POA: Diagnosis not present

## 2019-09-28 LAB — URINALYSIS, ROUTINE W REFLEX MICROSCOPIC
Bilirubin Urine: NEGATIVE
Glucose, UA: NEGATIVE mg/dL
Ketones, ur: NEGATIVE mg/dL
Nitrite: NEGATIVE
Protein, ur: 30 mg/dL — AB
RBC / HPF: >50 RBC/hpf — ABNORMAL HIGH (ref 0–5)
Specific Gravity, Urine: 1.014 (ref 1.005–1.030)
pH: 5 (ref 5.0–8.0)

## 2019-09-28 LAB — COMPREHENSIVE METABOLIC PANEL
ALT: 8 U/L (ref 0–44)
AST: 16 U/L (ref 15–41)
Albumin: 3.5 g/dL (ref 3.5–5.0)
Alkaline Phosphatase: 60 U/L (ref 38–126)
Anion gap: 13 (ref 5–15)
BUN: 11 mg/dL (ref 8–23)
CO2: 23 mmol/L (ref 22–32)
Calcium: 8.9 mg/dL (ref 8.9–10.3)
Chloride: 102 mmol/L (ref 98–111)
Creatinine, Ser: 0.74 mg/dL (ref 0.44–1.00)
GFR calc Af Amer: >60 mL/min (ref >60)
GFR calc non Af Amer: >60 mL/min (ref >60)
Glucose, Bld: 104 mg/dL — ABNORMAL HIGH (ref 70–99)
Potassium: 2.9 mmol/L — ABNORMAL LOW (ref 3.5–5.1)
Sodium: 138 mmol/L (ref 135–145)
Total Bilirubin: 2.2 mg/dL — ABNORMAL HIGH (ref 0.3–1.2)
Total Protein: 6.7 g/dL (ref 6.5–8.1)

## 2019-09-28 LAB — CBC WITH DIFFERENTIAL/PLATELET
Abs Immature Granulocytes: 0.04 10*3/uL (ref 0.00–0.07)
Basophils Absolute: 0 10*3/uL (ref 0.0–0.1)
Basophils Relative: 0 %
Eosinophils Absolute: 0 10*3/uL (ref 0.0–0.5)
Eosinophils Relative: 0 %
HCT: 40.7 % (ref 36.0–46.0)
Hemoglobin: 12.6 g/dL (ref 12.0–15.0)
Immature Granulocytes: 0 %
Lymphocytes Relative: 7 %
Lymphs Abs: 0.6 10*3/uL — ABNORMAL LOW (ref 0.7–4.0)
MCH: 25.6 pg — ABNORMAL LOW (ref 26.0–34.0)
MCHC: 31 g/dL (ref 30.0–36.0)
MCV: 82.6 fL (ref 80.0–100.0)
Monocytes Absolute: 0.8 10*3/uL (ref 0.1–1.0)
Monocytes Relative: 9 %
Neutro Abs: 7.4 10*3/uL (ref 1.7–7.7)
Neutrophils Relative %: 84 %
Platelets: 207 10*3/uL (ref 150–400)
RBC: 4.93 MIL/uL (ref 3.87–5.11)
RDW: 15.3 % (ref 11.5–15.5)
WBC: 8.9 10*3/uL (ref 4.0–10.5)
nRBC: 0 % (ref 0.0–0.2)

## 2019-09-28 LAB — LIPASE, BLOOD: Lipase: 21 U/L (ref 11–51)

## 2019-09-28 MED ORDER — SODIUM CHLORIDE 0.9 % IV BOLUS
1000.0000 mL | Freq: Once | INTRAVENOUS | Status: AC
  Administered 2019-09-28: 1000 mL via INTRAVENOUS

## 2019-09-28 MED ORDER — CEPHALEXIN 500 MG PO CAPS: 500.0000 mg | ORAL_CAPSULE | Freq: Two times a day (BID) | ORAL | 0 refills | Status: AC

## 2019-09-28 MED ORDER — ACETAMINOPHEN 325 MG PO TABS
650.0000 mg | ORAL_TABLET | Freq: Once | ORAL | Status: AC | PRN
  Administered 2019-09-28: 650 mg via ORAL
  Filled 2019-09-28: qty 2

## 2019-09-28 MED ORDER — POTASSIUM CHLORIDE 20 MEQ/15ML (10%) PO SOLN
40.0000 meq | Freq: Once | ORAL | Status: AC
  Administered 2019-09-28: 40 meq via ORAL
  Filled 2019-09-28: qty 30

## 2019-09-28 NOTE — ED Notes (Signed)
Iv does not flush or run

## 2019-09-28 NOTE — ED Triage Notes (Signed)
Pt c.o chills for the past 2 weeks, pt seen here last week for the same, COVID negative. Temp 102.5 in triage. Pt has not taken any OTC medications today. Denies cough, just some nausea.

## 2019-09-28 NOTE — ED Notes (Signed)
Iv fluids changed to left iv.

## 2019-09-28 NOTE — ED Notes (Signed)
Iv attempted x 2 withoput success. 

## 2019-09-28 NOTE — Discharge Instructions (Addendum)
Continue 650 mg of Tylenol every 6 hours as needed for fever and chills.  Take antibiotic as prescribed.  Self isolate until you hear back from your coronavirus test.  Please return to the ED if your symptoms worsen.

## 2019-09-28 NOTE — ED Provider Notes (Signed)
[facility] EMERGENCY DEPARTMENT Provider Note   CSN: [id number] Arrival date & time: 09/28/19  1937     History   Chief Complaint Chief Complaint  Patient presents with  . Fever  . Nausea    HPI Ariel Henry is a 68 y.o. female.     The history is provided by the patient.  Fever Temp source:  Subjective Severity:  Mild Onset quality:  Gradual Timing:  Constant Progression:  Unchanged Chronicity:  New Relieved by:  Nothing Worsened by:  Nothing Associated symptoms: chills   Associated symptoms: no chest pain, no confusion, no cough, no diarrhea, no dysuria, no ear pain, no headaches, no myalgias, no nausea, no rash, no rhinorrhea, no somnolence, no sore throat and no vomiting   Risk factors: no immunosuppression and no sick contacts     Past Medical History:  Diagnosis Date  . Arthritis   . DVT (deep venous thrombosis) ([place])   . Glaucoma   . Neuromuscular disorder (HCC)    carpal tunnel both wrists  . Obesity   . Pneumonia   . Thyroid disease     Patient Active Problem List   Diagnosis Date Noted  . Right leg pain 12/22/2016  . Glaucoma 12/22/2016  . Hypothyroidism 12/22/2016  . Pulmonary embolism ([place]) 12/21/2016  . Acute respiratory failure ([place]) 12/14/2016  . Special screening for malignant neoplasms, colon   . Lipoma of colon     Past Surgical History:  Procedure Laterality Date  . ABDOMINAL HYSTERECTOMY    . bladder tack    . COLONOSCOPY WITH PROPOFOL N/A 09/04/2016   Procedure: COLONOSCOPY WITH PROPOFOL;  Surgeon: Mauri Pole, MD;  Location: [facility];  Service: Endoscopy;  Laterality: N/A;  . EYE SURGERY     bilateral cataract extraction     OB History   No obstetric history on file.      Home Medications    Prior to Admission medications   Medication Sig Start Date End Date Taking? Authorizing Provider  albuterol (PROVENTIL HFA;VENTOLIN HFA) 108 (90 BASE) MCG/ACT inhaler Inhale 1-2 puffs into  the lungs every 6 (six) hours as needed for wheezing. 07/12/13   Fredia Sorrow, MD  cetirizine (ZYRTEC) 10 MG tablet Take 1 tablet (10 mg total) by mouth daily. 05/10/18   Zigmund Gottron, NP  cholecalciferol (VITAMIN D) 1000 units tablet Take 1,000 Units by mouth daily.    [provider]  dorzolamide (TRUSOPT) 2 % ophthalmic solution 1 drop 3 (three) times daily.    [provider]  gabapentin (NEURONTIN) 300 MG capsule Take 300 mg by mouth at bedtime.    [provider]  gabapentin (NEURONTIN) 300 MG capsule Take 300 mg by mouth 2 (two) times daily. 600mg  in morning and afternoon, 300mg  at bedtime     [provider]  hydrochlorothiazide (HYDRODIURIL) 25 MG tablet Take 25 mg by mouth daily.    [provider]  hydrOXYzine (ATARAX/VISTARIL) 25 MG tablet Take 25 mg by mouth 4 (four) times daily as needed for anxiety.    [provider]  ipratropium (ATROVENT) 0.06 % nasal spray Place 2 sprays into both nostrils 3 (three) times daily. 05/10/18   Zigmund Gottron, NP  latanoprost (XALATAN) 0.005 % ophthalmic solution Place 1 drop into both eyes at bedtime.    [provider]  Polyethyl Glycol-Propyl Glycol (SYSTANE ULTRA OP) Apply to eye.    [provider]  predniSONE (DELTASONE) 5 MG tablet Take 1 tablet (  5 mg total) by mouth 2 (two) times daily with a meal. 12/23/17   Robyn Haber, MD  pyridOXINE (VITAMIN B-6) 100 MG tablet Take 100 mg by mouth daily.    [provider]  Rivaroxaban 15 & 20 MG TBPK Take as directed on package: Start with one 15mg  tablet by mouth twice a day with food. On Day 22, switch to one 20mg  tablet once a day with food. 12/22/16   Mikhail, Velta Addison, DO  timolol (TIMOPTIC) 0.5 % ophthalmic solution 1 drop 2 (two) times daily.    [provider]  Timolol Maleate PF 0.5 % SOLN Apply 1 drop to eye every morning.    [provider]    Family History Family History  Problem  Relation Age of Onset  . Colon cancer Neg Hx     Social History Social History   Tobacco Use  . Smoking status: Never Smoker  . Smokeless tobacco: Never Used  Substance Use Topics  . Alcohol use: No  . Drug use: No     Allergies   Patient has no known allergies.   Review of Systems Review of Systems  Constitutional: Positive for chills and fever.  HENT: Negative for ear pain, rhinorrhea and sore throat.   Eyes: Negative for pain and visual disturbance.  Respiratory: Negative for cough and shortness of breath.   Cardiovascular: Negative for chest pain and palpitations.  Gastrointestinal: Negative for abdominal pain, diarrhea, nausea and vomiting.  Genitourinary: Negative for dysuria and hematuria.  Musculoskeletal: Negative for arthralgias, back pain and myalgias.  Skin: Negative for color change and rash.  Neurological: Negative for seizures, syncope and headaches.  Psychiatric/Behavioral: Negative for confusion.  All other systems reviewed and are negative.    Physical Exam Updated Vital Signs  ED Triage Vitals  Enc Vitals Group     BP 09/28/19 1949 108/74     Pulse Rate 09/28/19 1949 92     Resp 09/28/19 1949 20     Temp 09/28/19 1949 (!) 102.8 F (39.3 C)     Temp Source 09/28/19 1949 Oral     SpO2 09/28/19 1949 100 %     Weight 09/28/19 2035 258 lb (117 kg)     Height 09/28/19 2035 5\' 1"  (1.549 m)     Head Circumference --      Peak Flow --      Pain Score 09/28/19 1955 0     Pain Loc --      Pain Edu? --      Excl. in [place]? --     Physical Exam Vitals signs and nursing note reviewed.  Constitutional:      General: She is not in acute distress.    Appearance: She is well-developed.  HENT:     Head: Normocephalic and atraumatic.     Nose: Nose normal.     Mouth/Throat:     Mouth: Mucous membranes are moist.  Eyes:     Extraocular Movements: Extraocular movements intact.     Conjunctiva/sclera: Conjunctivae normal.     Pupils: Pupils are equal,  round, and reactive to light.  Neck:     Musculoskeletal: Normal range of motion and neck supple.  Cardiovascular:     Rate and Rhythm: Normal rate and regular rhythm.     Pulses: Normal pulses.     Heart sounds: Normal heart sounds. No murmur.  Pulmonary:     Effort: Pulmonary effort is normal. No respiratory distress.     Breath sounds:  Normal breath sounds.  Abdominal:     General: Abdomen is flat.     Palpations: Abdomen is soft.     Tenderness: There is no abdominal tenderness.  Musculoskeletal: Normal range of motion.        General: No tenderness.  Skin:    General: Skin is warm and dry.     Capillary Refill: Capillary refill takes less than 2 seconds.  Neurological:     General: No focal deficit present.     Mental Status: She is alert and oriented to person, place, and time.     Cranial Nerves: No cranial nerve deficit.     Sensory: No sensory deficit.     Motor: No weakness.     Coordination: Coordination normal.  Psychiatric:        Mood and Affect: Mood normal.      ED Treatments / Results  Labs (all labs ordered are listed, but only abnormal results are displayed) Labs Reviewed  CBC WITH DIFFERENTIAL/PLATELET - Abnormal; Notable for the following components:      Result Value   MCH 25.6 (*)    Lymphs Abs 0.6 (*)    All other components within normal limits  COMPREHENSIVE METABOLIC PANEL - Abnormal; Notable for the following components:   Potassium 2.9 (*)    Glucose, Bld 104 (*)    Total Bilirubin 2.2 (*)    All other components within normal limits  URINALYSIS, ROUTINE W REFLEX MICROSCOPIC - Abnormal; Notable for the following components:   APPearance CLOUDY (*)    Hgb urine dipstick LARGE (*)    Protein, ur 30 (*)    Leukocytes,Ua TRACE (*)    RBC / HPF >50 (*)    Bacteria, UA RARE (*)    All other components within normal limits  URINE CULTURE  LIPASE, BLOOD    EKG None  Radiology Dg Chest Portable 1 View  Result Date: 09/28/2019  CLINICAL DATA:  Fever EXAM: PORTABLE CHEST 1 VIEW COMPARISON:  12/21/2016 FINDINGS: Elevation of right diaphragm. Streaky basilar atelectasis. No consolidation or effusion. Normal heart size. No pneumothorax. Left paratracheal opacity, no change, corresponding to CT demonstrated enlarged nodular thyroid IMPRESSION: No active disease.  Basilar atelectasis. Electronically Signed   By: Donavan Foil M.D.   On: 09/28/2019 21:19    Procedures Procedures (including critical care time)  Medications Ordered in ED Medications  acetaminophen (TYLENOL) tablet 650 mg (650 mg Oral Given 09/28/19 1959)  sodium chloride 0.9 % bolus 1,000 mL (1,000 mLs Intravenous New Bag/Given 09/28/19 2216)     Initial Impression / Assessment and Plan / ED Course  I have reviewed the triage vital signs and the nursing notes.  Pertinent labs & imaging results that were available during my care of the patient were reviewed by me and considered in my medical decision making (see chart for details).     Ugochi Anushri Relph is a 68 year old female with history of DVT, obesity who presents to the ED with fever, chills.  Symptoms for the last several hours.  No history of coronavirus.  No known exposures.  Denies any cough, abdominal pain, pain with urination.  Sounds viral in nature with chills, fever, body aches.  Overall patient appears well.  Normal vitals despite fever.  No concern for sepsis at this time.  Will give IV fluids, Tylenol, basic labs, chest x-ray, urinalysis.  Will consider outpatient coronavirus swab given viral type symptoms. No respiratory symptoms.  No significant leukocytosis, anemia.  Potassium is  2.9 and will replete.  Recommend continued use of Tylenol at home.  Questionable urine infection and will treat with Keflex.  Urine culture was sent.  Chest x-ray showed no signs of pneumonia.  Likely viral process.  Will test for coronavirus.  Given return precautions.  Understands self-isolation at home.   This chart was dictated using voice recognition software.  Despite best efforts to proofread,  errors can occur which can change the documentation meaning.  Lurine Jessika Caddick was evaluated in Emergency Department on 09/28/2019 for the symptoms described in the history of present illness. She was evaluated in the context of the global COVID-19 pandemic, which necessitated consideration that the patient might be at risk for infection with the SARS-CoV-2 virus that causes COVID-19. Institutional protocols and algorithms that pertain to the evaluation of patients at risk for COVID-19 are in a state of rapid change based on information released by regulatory bodies including the CDC and federal and state organizations. These policies and algorithms were followed during the patient's care in the ED.     Final Clinical Impressions(s) / ED Diagnoses   Final diagnoses:  Fever, unspecified fever cause    ED Discharge Orders    None       Lennice Sites, DO 09/28/19 2320

## 2019-09-30 LAB — URINE CULTURE: Culture: >=100000 — AB

## 2019-09-30 LAB — NOVEL CORONAVIRUS, NAA (HOSP ORDER, SEND-OUT TO REF LAB; TAT 18-24 HRS): SARS-CoV-2, NAA: NOT DETECTED

## 2019-10-01 ENCOUNTER — Telehealth: Payer: Self-pay | Admitting: *Deleted

## 2019-10-01 NOTE — Telephone Encounter (Signed)
Post ED Visit - Positive Culture Follow-up  Culture report reviewed by antimicrobial stewardship pharmacist: [facility] Team []  Elenor Quinones, Pharm.D. []  Heide Guile, Pharm.D., BCPS AQ-ID []  Parks Neptune, Pharm.D., BCPS []  Alycia Rossetti, Pharm.D., BCPS []  [place], [place].D., BCPS, AAHIVP []  Legrand Como, Pharm.D., BCPS, AAHIVP []  Salome Arnt, PharmD, BCPS []  Johnnette Gourd, PharmD, BCPS []  Hughes Better, PharmD, BCPS []  Leeroy Cha, PharmD []  Laqueta Linden, PharmD, BCPS []  Albertina Parr, PharmD [place]ney Setting, [place] Team []  Leodis Sias, PharmD []  Lindell Spar, PharmD []  Royetta Asal, PharmD []  Graylin Shiver, Rph []  Rema Fendt) Glennon Mac, PharmD []  Arlyn Dunning, PharmD []  Netta Cedars, PharmD []  Dia Sitter, PharmD []  Leone Haven, PharmD []  Gretta Arab, PharmD []  Theodis Shove, PharmD []  Peggyann Juba, PharmD []  Reuel Boom, PharmD   Positive urine culture Treated with Cephalexin, organism sensitive to the same and no further patient follow-up is required at this time.  Harlon [facility] 10/01/2019, 9:53 AM

## 2019-10-08 DIAGNOSIS — R202 Paresthesia of skin: Secondary | ICD-10-CM | POA: Diagnosis not present

## 2019-10-08 DIAGNOSIS — Z1329 Encounter for screening for other suspected endocrine disorder: Secondary | ICD-10-CM | POA: Diagnosis not present

## 2019-10-08 DIAGNOSIS — Z131 Encounter for screening for diabetes mellitus: Secondary | ICD-10-CM | POA: Diagnosis not present

## 2019-10-08 DIAGNOSIS — M1990 Unspecified osteoarthritis, unspecified site: Secondary | ICD-10-CM | POA: Diagnosis not present

## 2019-10-08 DIAGNOSIS — E876 Hypokalemia: Secondary | ICD-10-CM | POA: Diagnosis not present

## 2019-10-08 DIAGNOSIS — E079 Disorder of thyroid, unspecified: Secondary | ICD-10-CM | POA: Diagnosis not present

## 2019-10-08 DIAGNOSIS — I10 Essential (primary) hypertension: Secondary | ICD-10-CM | POA: Diagnosis not present

## 2019-10-08 DIAGNOSIS — E559 Vitamin D deficiency, unspecified: Secondary | ICD-10-CM | POA: Diagnosis not present

## 2019-10-08 DIAGNOSIS — Z86711 Personal history of pulmonary embolism: Secondary | ICD-10-CM | POA: Diagnosis not present

## 2019-10-28 DIAGNOSIS — E079 Disorder of thyroid, unspecified: Secondary | ICD-10-CM | POA: Diagnosis not present

## 2019-10-28 DIAGNOSIS — I10 Essential (primary) hypertension: Secondary | ICD-10-CM | POA: Diagnosis not present

## 2019-10-28 DIAGNOSIS — R202 Paresthesia of skin: Secondary | ICD-10-CM | POA: Diagnosis not present

## 2019-10-28 DIAGNOSIS — E559 Vitamin D deficiency, unspecified: Secondary | ICD-10-CM | POA: Diagnosis not present

## 2019-10-28 DIAGNOSIS — Z86711 Personal history of pulmonary embolism: Secondary | ICD-10-CM | POA: Diagnosis not present

## 2019-10-28 DIAGNOSIS — M1990 Unspecified osteoarthritis, unspecified site: Secondary | ICD-10-CM | POA: Diagnosis not present

## 2019-11-03 DIAGNOSIS — M1612 Unilateral primary osteoarthritis, left hip: Secondary | ICD-10-CM | POA: Diagnosis not present

## 2019-11-03 DIAGNOSIS — M25552 Pain in left hip: Secondary | ICD-10-CM | POA: Diagnosis not present

## 2019-11-05 DIAGNOSIS — H401111 Primary open-angle glaucoma, right eye, mild stage: Secondary | ICD-10-CM | POA: Diagnosis not present

## 2019-11-10 DIAGNOSIS — I495 Sick sinus syndrome: Secondary | ICD-10-CM | POA: Diagnosis not present

## 2019-11-10 DIAGNOSIS — R69 Illness, unspecified: Secondary | ICD-10-CM | POA: Diagnosis not present

## 2019-11-10 DIAGNOSIS — R9431 Abnormal electrocardiogram [ECG] [EKG]: Secondary | ICD-10-CM | POA: Diagnosis not present

## 2019-11-13 DIAGNOSIS — N8110 Cystocele, unspecified: Secondary | ICD-10-CM | POA: Diagnosis not present

## 2019-11-13 DIAGNOSIS — N952 Postmenopausal atrophic vaginitis: Secondary | ICD-10-CM | POA: Diagnosis not present

## 2019-11-23 DIAGNOSIS — E079 Disorder of thyroid, unspecified: Secondary | ICD-10-CM | POA: Diagnosis not present

## 2019-11-23 DIAGNOSIS — E559 Vitamin D deficiency, unspecified: Secondary | ICD-10-CM | POA: Diagnosis not present

## 2019-11-23 DIAGNOSIS — I10 Essential (primary) hypertension: Secondary | ICD-10-CM | POA: Diagnosis not present

## 2019-11-23 DIAGNOSIS — Z0001 Encounter for general adult medical examination with abnormal findings: Secondary | ICD-10-CM | POA: Diagnosis not present

## 2019-11-23 DIAGNOSIS — R202 Paresthesia of skin: Secondary | ICD-10-CM | POA: Diagnosis not present

## 2019-11-23 DIAGNOSIS — M1990 Unspecified osteoarthritis, unspecified site: Secondary | ICD-10-CM | POA: Diagnosis not present

## 2019-11-23 DIAGNOSIS — Z86711 Personal history of pulmonary embolism: Secondary | ICD-10-CM | POA: Diagnosis not present

## 2019-12-15 DIAGNOSIS — Z20828 Contact with and (suspected) exposure to other viral communicable diseases: Secondary | ICD-10-CM | POA: Diagnosis not present

## 2019-12-21 DIAGNOSIS — G44209 Tension-type headache, unspecified, not intractable: Secondary | ICD-10-CM | POA: Diagnosis not present

## 2019-12-21 DIAGNOSIS — R05 Cough: Secondary | ICD-10-CM | POA: Diagnosis not present

## 2019-12-21 DIAGNOSIS — U071 COVID-19: Secondary | ICD-10-CM | POA: Diagnosis not present

## 2019-12-21 DIAGNOSIS — J0110 Acute frontal sinusitis, unspecified: Secondary | ICD-10-CM | POA: Diagnosis not present

## 2019-12-29 DIAGNOSIS — R252 Cramp and spasm: Secondary | ICD-10-CM | POA: Diagnosis not present

## 2019-12-29 DIAGNOSIS — U071 COVID-19: Secondary | ICD-10-CM | POA: Diagnosis not present

## 2020-01-04 DIAGNOSIS — R202 Paresthesia of skin: Secondary | ICD-10-CM | POA: Diagnosis not present

## 2020-01-04 DIAGNOSIS — I10 Essential (primary) hypertension: Secondary | ICD-10-CM | POA: Diagnosis not present

## 2020-01-04 DIAGNOSIS — Z86711 Personal history of pulmonary embolism: Secondary | ICD-10-CM | POA: Diagnosis not present

## 2020-01-04 DIAGNOSIS — M1990 Unspecified osteoarthritis, unspecified site: Secondary | ICD-10-CM | POA: Diagnosis not present

## 2020-01-04 DIAGNOSIS — E559 Vitamin D deficiency, unspecified: Secondary | ICD-10-CM | POA: Diagnosis not present

## 2020-01-04 DIAGNOSIS — E079 Disorder of thyroid, unspecified: Secondary | ICD-10-CM | POA: Diagnosis not present

## 2020-02-02 DIAGNOSIS — Z131 Encounter for screening for diabetes mellitus: Secondary | ICD-10-CM | POA: Diagnosis not present

## 2020-02-02 DIAGNOSIS — E079 Disorder of thyroid, unspecified: Secondary | ICD-10-CM | POA: Diagnosis not present

## 2020-02-02 DIAGNOSIS — Z01118 Encounter for examination of ears and hearing with other abnormal findings: Secondary | ICD-10-CM | POA: Diagnosis not present

## 2020-02-02 DIAGNOSIS — E559 Vitamin D deficiency, unspecified: Secondary | ICD-10-CM | POA: Diagnosis not present

## 2020-02-02 DIAGNOSIS — M1990 Unspecified osteoarthritis, unspecified site: Secondary | ICD-10-CM | POA: Diagnosis not present

## 2020-02-02 DIAGNOSIS — I10 Essential (primary) hypertension: Secondary | ICD-10-CM | POA: Diagnosis not present

## 2020-02-02 DIAGNOSIS — Z136 Encounter for screening for cardiovascular disorders: Secondary | ICD-10-CM | POA: Diagnosis not present

## 2020-02-02 DIAGNOSIS — Z86711 Personal history of pulmonary embolism: Secondary | ICD-10-CM | POA: Diagnosis not present

## 2020-02-02 DIAGNOSIS — Z0001 Encounter for general adult medical examination with abnormal findings: Secondary | ICD-10-CM | POA: Diagnosis not present

## 2020-02-02 DIAGNOSIS — R202 Paresthesia of skin: Secondary | ICD-10-CM | POA: Diagnosis not present

## 2020-02-02 DIAGNOSIS — Z0100 Encounter for examination of eyes and vision without abnormal findings: Secondary | ICD-10-CM | POA: Diagnosis not present

## 2020-02-08 DIAGNOSIS — Z9889 Other specified postprocedural states: Secondary | ICD-10-CM | POA: Diagnosis not present

## 2020-02-08 DIAGNOSIS — H401131 Primary open-angle glaucoma, bilateral, mild stage: Secondary | ICD-10-CM | POA: Diagnosis not present

## 2020-02-27 IMAGING — US US THYROID
1 series · 12 of 25 positions shown · non-contrast
Comparison: 06/09/2018; 08/10/2016; chest CT - 12/21/2016;
02/15/2026

CLINICAL DATA: Prior ultrasound follow-up. Follow-up thyroid
nodules.

EXAM:
THYROID ULTRASOUND
TECHNIQUE: Ultrasound examination of the thyroid gland and adjacent soft
tissues was performed.

[Series 1: us thyroid · 0.10mm/px · 12 of 52 slices shown]
[im 3/52]
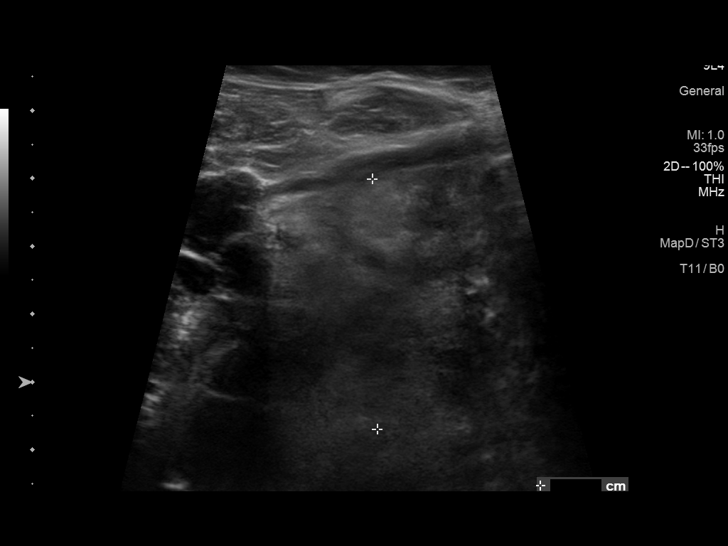
[im 7/52]
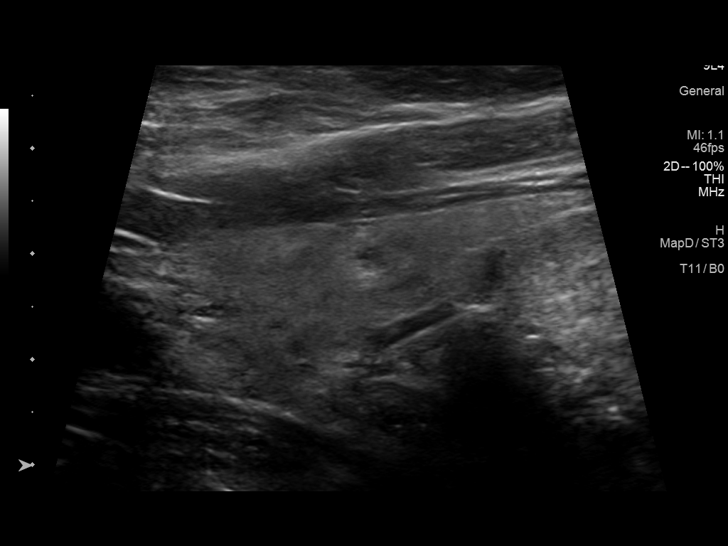
[im 11/52]
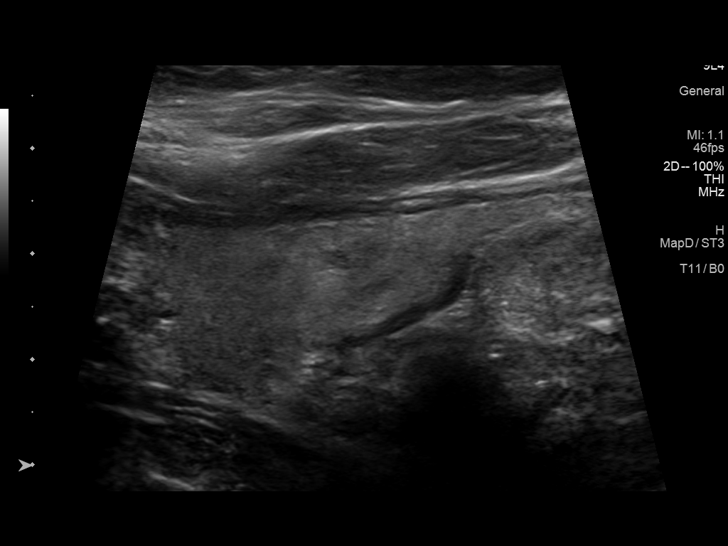
[im 15/52]
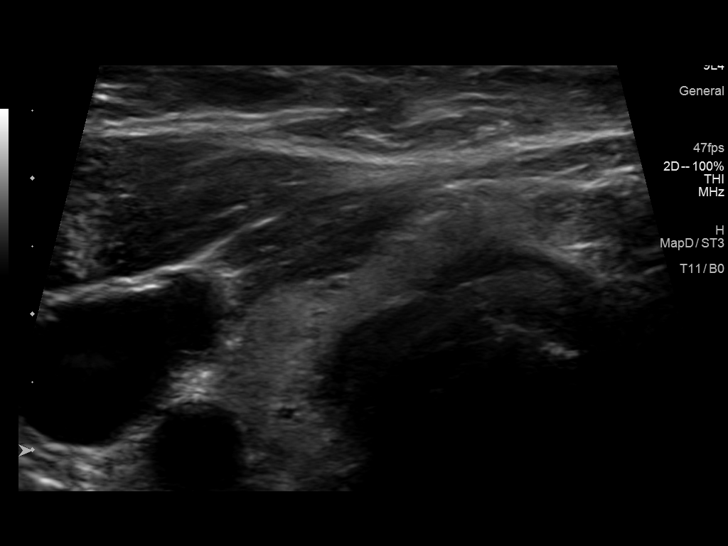
[im 20/52]
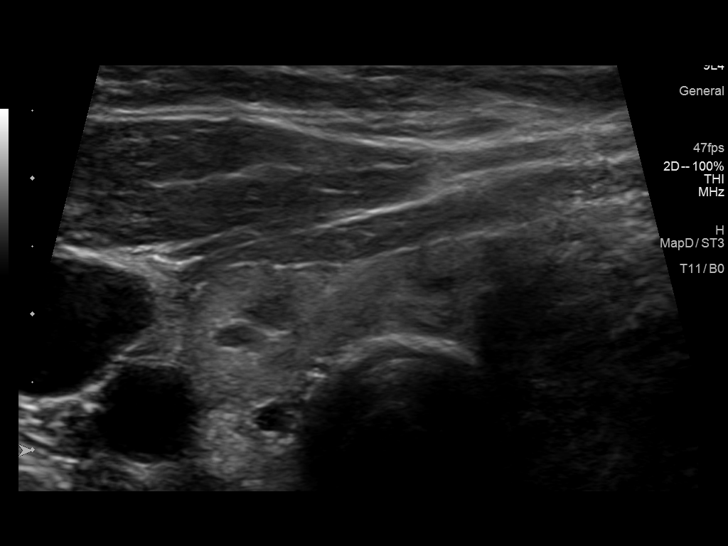
[im 24/52]
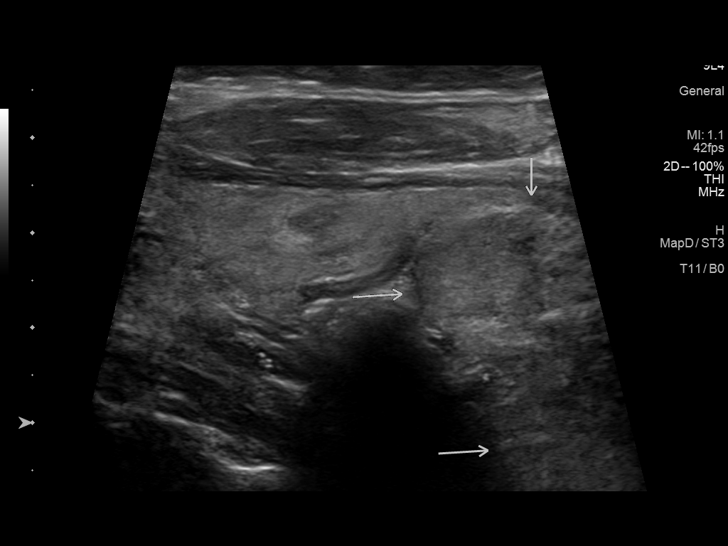
[im 28/52]
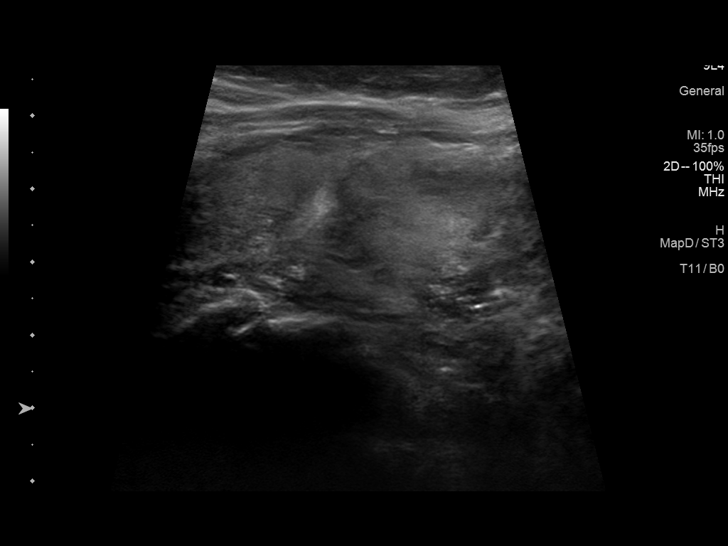
[im 32/52]
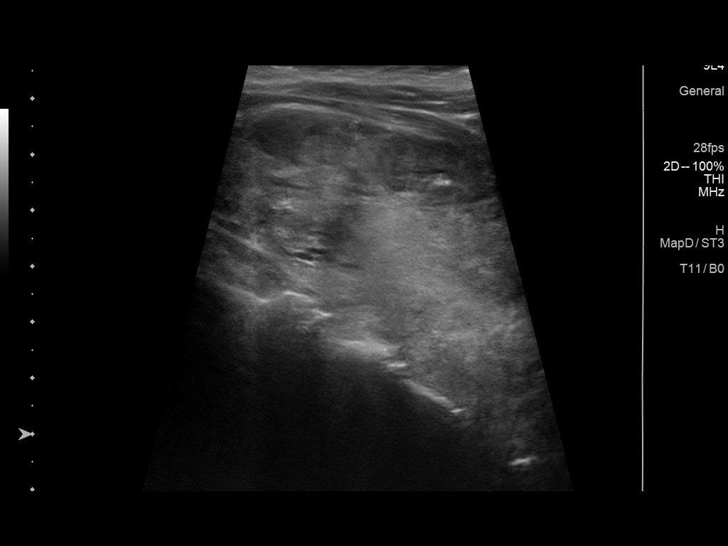
[im 37/52]
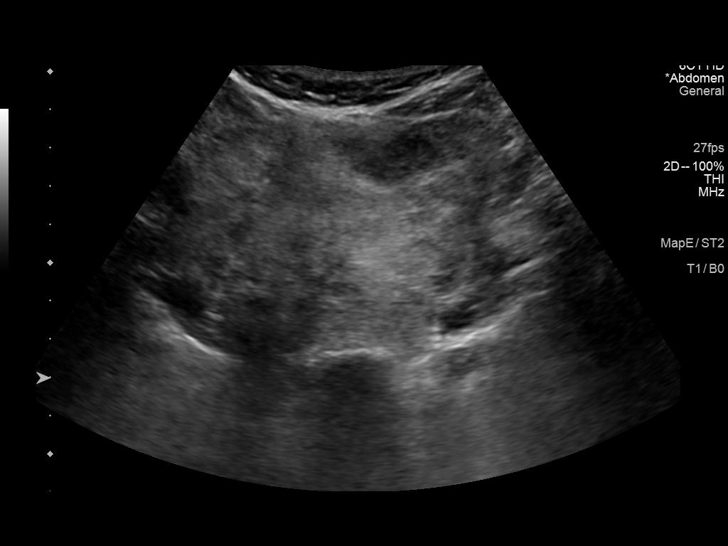
[im 41/52]
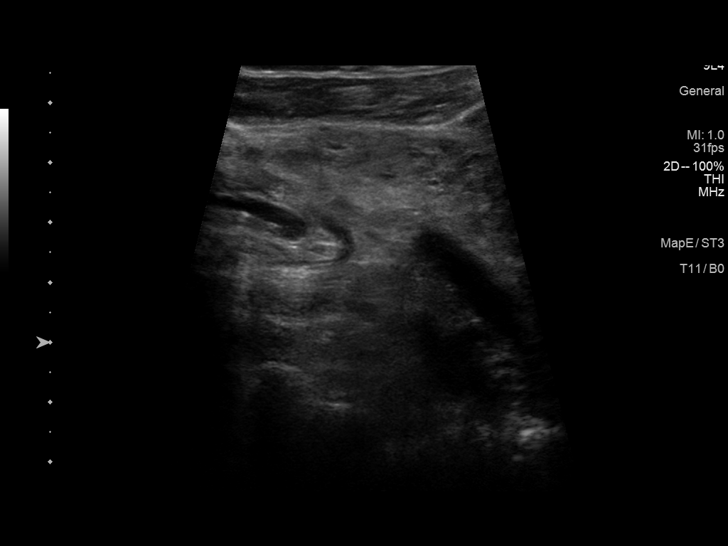
[im 45/52]
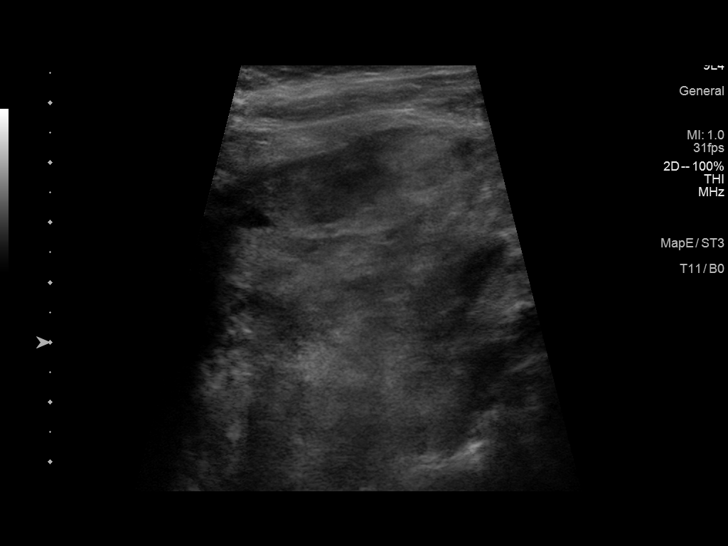
[im 49/52]
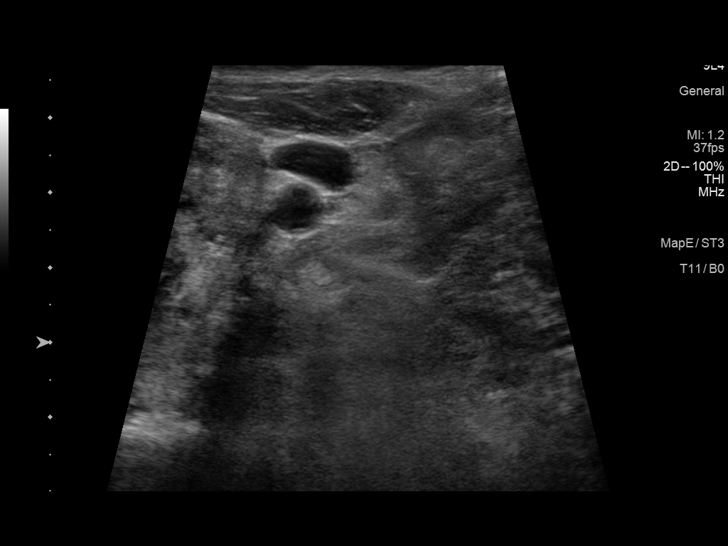

[12 of 25 positions shown; findings below may reference images not displayed]

FINDINGS: Parenchymal Echotexture: Moderately heterogenous

Isthmus: Normal in size measuring 0.6 cm in diameter, unchanged

Right lobe: Normal in size measuring 3.8 x 1.7 x 1.3 cm, unchanged
from previously, 3.6 x 1.2 x 1.3 cm

Left lobe: Enlarged measuring 10.5 x 4.3 x 9.2 cm, unchanged,
previously, 9.2 x 4.2 x 5.8 cm, with size differences likely total
to scan plane projection

_________________________________________________________

Estimated total number of nodules >/= 1 cm: 2

Number of spongiform nodules >/=  2 cm not described below (TR1): 0

Number of mixed cystic and solid nodules >/= 1.5 cm not described
below (TR2): 0

_________________________________________________________

Nodule # 1:

Prior biopsy: No

Location: Right; Superior

Maximum size: 1.5 cm; Other 2 dimensions: 1.0 x 1.0 cm, previously,
1.3 cm (when seen in hind site compared to the [DATE] examination)

Composition: solid/almost completely solid (2)

Echogenicity: isoechoic (1)

Shape: not taller-than-wide (0)

Margins: ill-defined (0)

Echogenic foci: none (0)

ACR TI-RADS total points: 3.

ACR TI-RADS risk category:  TR3 (3 points).

Significant change in size (>/= 20% in two dimensions and minimal
increase of 2 mm): No

Change in features: No

Change in ACR TI-RADS risk category: No

ACR TI-RADS recommendations:

*Given size (>/= 1.5 - 2.4 cm) and appearance, a follow-up
ultrasound in 1 year should be considered based on TI-RADS criteria.

_________________________________________________________

There is asymmetric marked enlargement of the left lobe of the
thyroid in comparison to the right, either due to asymmetric goiter
involvement of the left lobe versus the left lobe being replaced
with a ill-defined mass. Direct comparison to previous examinations
is difficult secondary to a combination of substernal extension (as
demonstrated on prior chest CT dating back to [DATE]) as well as
patient body habitus and poor sonographic window, however in general
is felt to be similar to previous examinations, currently measuring
10.5 x 9.2 x 4.3 cm, previously, 9.5 x 7.5 x 4.6 cm when compared to
the [DATE] examination.
IMPRESSION: 1. Similar-appearing asymmetric marked enlargement of the left lobe
of the thyroid in comparison to the right, either due to asymmetric
goiterous involvement of the left lobe versus the left lobe being
replaced with an ill-defined nodule/mass. In general, the left lobe
appears similar to remote thyroid ultrasound performed [DATE] as
well as chest CT performed January 2016. Approximately 1.5 cm isoechoic ill-defined nodule/pseudonodule
within the right lobe of the thyroid (labeled 1), is unchanged in
hind site compared to the [DATE] examination though again meets
imaging criteria to recommend 1 year follow-up. Follow-up ultrasound
in [DATE] would ensure 5 years of stability and thus a benign
etiology.

The above is in keeping with the ACR TI-RADS recommendations - [HOSPITAL] 7117;[DATE].

## 2020-03-02 DIAGNOSIS — H9041 Sensorineural hearing loss, unilateral, right ear, with unrestricted hearing on the contralateral side: Secondary | ICD-10-CM | POA: Diagnosis not present

## 2020-03-02 DIAGNOSIS — H903 Sensorineural hearing loss, bilateral: Secondary | ICD-10-CM | POA: Insufficient documentation

## 2020-03-02 DIAGNOSIS — H918X3 Other specified hearing loss, bilateral: Secondary | ICD-10-CM | POA: Insufficient documentation

## 2020-04-19 DIAGNOSIS — E079 Disorder of thyroid, unspecified: Secondary | ICD-10-CM | POA: Diagnosis not present

## 2020-04-19 DIAGNOSIS — Z86711 Personal history of pulmonary embolism: Secondary | ICD-10-CM | POA: Diagnosis not present

## 2020-04-19 DIAGNOSIS — I10 Essential (primary) hypertension: Secondary | ICD-10-CM | POA: Diagnosis not present

## 2020-04-19 DIAGNOSIS — M1990 Unspecified osteoarthritis, unspecified site: Secondary | ICD-10-CM | POA: Diagnosis not present

## 2020-04-19 DIAGNOSIS — R202 Paresthesia of skin: Secondary | ICD-10-CM | POA: Diagnosis not present

## 2020-04-19 DIAGNOSIS — E559 Vitamin D deficiency, unspecified: Secondary | ICD-10-CM | POA: Diagnosis not present

## 2020-04-20 ENCOUNTER — Other Ambulatory Visit: Payer: Self-pay | Admitting: Physician Assistant

## 2020-04-20 DIAGNOSIS — Z1231 Encounter for screening mammogram for malignant neoplasm of breast: Secondary | ICD-10-CM

## 2020-04-23 DIAGNOSIS — Z008 Encounter for other general examination: Secondary | ICD-10-CM | POA: Diagnosis not present

## 2020-04-23 DIAGNOSIS — Z809 Family history of malignant neoplasm, unspecified: Secondary | ICD-10-CM | POA: Diagnosis not present

## 2020-04-23 DIAGNOSIS — Z6841 Body Mass Index (BMI) 40.0 and over, adult: Secondary | ICD-10-CM | POA: Diagnosis not present

## 2020-04-23 DIAGNOSIS — Z833 Family history of diabetes mellitus: Secondary | ICD-10-CM | POA: Diagnosis not present

## 2020-04-23 DIAGNOSIS — J45909 Unspecified asthma, uncomplicated: Secondary | ICD-10-CM | POA: Diagnosis not present

## 2020-04-23 DIAGNOSIS — Z7722 Contact with and (suspected) exposure to environmental tobacco smoke (acute) (chronic): Secondary | ICD-10-CM | POA: Diagnosis not present

## 2020-04-23 DIAGNOSIS — H409 Unspecified glaucoma: Secondary | ICD-10-CM | POA: Diagnosis not present

## 2020-04-23 DIAGNOSIS — I10 Essential (primary) hypertension: Secondary | ICD-10-CM | POA: Diagnosis not present

## 2020-04-23 DIAGNOSIS — Z8249 Family history of ischemic heart disease and other diseases of the circulatory system: Secondary | ICD-10-CM | POA: Diagnosis not present

## 2020-04-23 DIAGNOSIS — Z7901 Long term (current) use of anticoagulants: Secondary | ICD-10-CM | POA: Diagnosis not present

## 2020-04-29 DIAGNOSIS — I10 Essential (primary) hypertension: Secondary | ICD-10-CM | POA: Diagnosis not present

## 2020-04-29 DIAGNOSIS — E559 Vitamin D deficiency, unspecified: Secondary | ICD-10-CM | POA: Diagnosis not present

## 2020-04-29 DIAGNOSIS — M1990 Unspecified osteoarthritis, unspecified site: Secondary | ICD-10-CM | POA: Diagnosis not present

## 2020-04-29 DIAGNOSIS — R202 Paresthesia of skin: Secondary | ICD-10-CM | POA: Diagnosis not present

## 2020-04-29 DIAGNOSIS — Z86711 Personal history of pulmonary embolism: Secondary | ICD-10-CM | POA: Diagnosis not present

## 2020-04-29 DIAGNOSIS — E079 Disorder of thyroid, unspecified: Secondary | ICD-10-CM | POA: Diagnosis not present

## 2020-05-10 DIAGNOSIS — H1045 Other chronic allergic conjunctivitis: Secondary | ICD-10-CM | POA: Diagnosis not present

## 2020-05-10 DIAGNOSIS — Z961 Presence of intraocular lens: Secondary | ICD-10-CM | POA: Diagnosis not present

## 2020-05-10 DIAGNOSIS — H16223 Keratoconjunctivitis sicca, not specified as Sjogren's, bilateral: Secondary | ICD-10-CM | POA: Diagnosis not present

## 2020-05-10 DIAGNOSIS — H0102B Squamous blepharitis left eye, upper and lower eyelids: Secondary | ICD-10-CM | POA: Diagnosis not present

## 2020-05-10 DIAGNOSIS — H26491 Other secondary cataract, right eye: Secondary | ICD-10-CM | POA: Diagnosis not present

## 2020-05-10 DIAGNOSIS — H0102A Squamous blepharitis right eye, upper and lower eyelids: Secondary | ICD-10-CM | POA: Diagnosis not present

## 2020-05-10 DIAGNOSIS — Z9889 Other specified postprocedural states: Secondary | ICD-10-CM | POA: Diagnosis not present

## 2020-05-10 DIAGNOSIS — H401131 Primary open-angle glaucoma, bilateral, mild stage: Secondary | ICD-10-CM | POA: Diagnosis not present

## 2020-05-10 DIAGNOSIS — H35373 Puckering of macula, bilateral: Secondary | ICD-10-CM | POA: Diagnosis not present

## 2020-05-19 DIAGNOSIS — N952 Postmenopausal atrophic vaginitis: Secondary | ICD-10-CM | POA: Diagnosis not present

## 2020-05-19 DIAGNOSIS — N8110 Cystocele, unspecified: Secondary | ICD-10-CM | POA: Diagnosis not present

## 2020-05-26 ENCOUNTER — Ambulatory Visit
Admission: RE | Admit: 2020-05-26 | Discharge: 2020-05-26 | Disposition: A | Discharge status: Home / Self Care | Payer: Medicare HMO | Source: Ambulatory Visit | Attending: Physician Assistant | Admitting: Physician Assistant

## 2020-05-26 ENCOUNTER — Other Ambulatory Visit: Payer: Self-pay

## 2020-05-26 ENCOUNTER — Ambulatory Visit: Payer: Medicare HMO

## 2020-05-26 DIAGNOSIS — Z1231 Encounter for screening mammogram for malignant neoplasm of breast: Secondary | ICD-10-CM

## 2020-05-27 DIAGNOSIS — M1990 Unspecified osteoarthritis, unspecified site: Secondary | ICD-10-CM | POA: Diagnosis not present

## 2020-05-27 DIAGNOSIS — E559 Vitamin D deficiency, unspecified: Secondary | ICD-10-CM | POA: Diagnosis not present

## 2020-05-27 DIAGNOSIS — R202 Paresthesia of skin: Secondary | ICD-10-CM | POA: Diagnosis not present

## 2020-05-27 DIAGNOSIS — Z86711 Personal history of pulmonary embolism: Secondary | ICD-10-CM | POA: Diagnosis not present

## 2020-05-27 DIAGNOSIS — E079 Disorder of thyroid, unspecified: Secondary | ICD-10-CM | POA: Diagnosis not present

## 2020-05-27 DIAGNOSIS — I10 Essential (primary) hypertension: Secondary | ICD-10-CM | POA: Diagnosis not present

## 2020-06-06 ENCOUNTER — Other Ambulatory Visit: Payer: Self-pay | Admitting: Endocrinology

## 2020-06-06 DIAGNOSIS — I10 Essential (primary) hypertension: Secondary | ICD-10-CM | POA: Diagnosis not present

## 2020-06-06 DIAGNOSIS — E049 Nontoxic goiter, unspecified: Secondary | ICD-10-CM

## 2020-06-06 DIAGNOSIS — R7301 Impaired fasting glucose: Secondary | ICD-10-CM | POA: Diagnosis not present

## 2020-06-06 DIAGNOSIS — E010 Iodine-deficiency related diffuse (endemic) goiter: Secondary | ICD-10-CM | POA: Diagnosis not present

## 2020-06-06 DIAGNOSIS — E669 Obesity, unspecified: Secondary | ICD-10-CM | POA: Diagnosis not present

## 2020-06-07 DIAGNOSIS — E010 Iodine-deficiency related diffuse (endemic) goiter: Secondary | ICD-10-CM | POA: Diagnosis not present

## 2020-06-07 DIAGNOSIS — R7301 Impaired fasting glucose: Secondary | ICD-10-CM | POA: Diagnosis not present

## 2020-06-14 IMAGING — DX DG CHEST 1V PORT
1 series · 1 of 1 positions shown · non-contrast
Comparison: 12/21/2016

CLINICAL DATA: Fever

EXAM:
PORTABLE CHEST 1 VIEW

[chest ap]
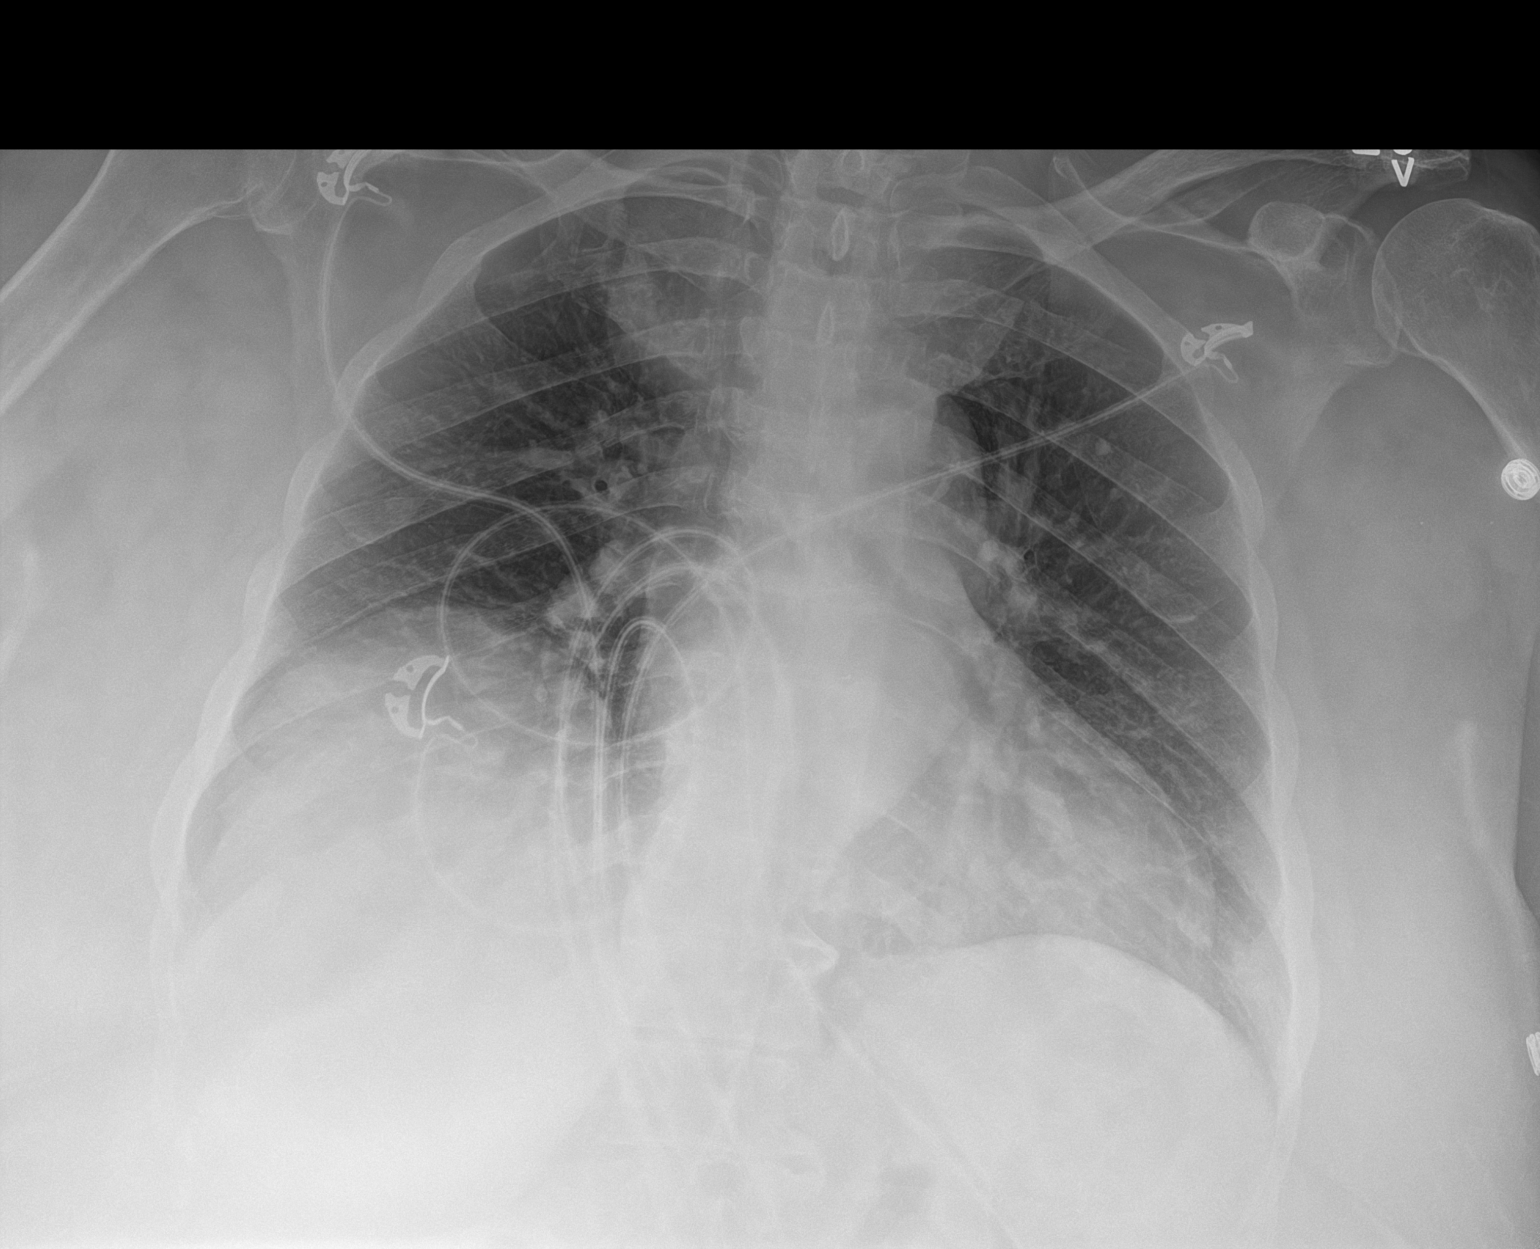

[1 of 1 positions shown; findings below may reference images not displayed]

FINDINGS: Elevation of right diaphragm. Streaky basilar atelectasis. No
consolidation or effusion. Normal heart size. No pneumothorax. Left
paratracheal opacity, no change, corresponding to CT demonstrated
enlarged nodular thyroid
IMPRESSION: No active disease.  Basilar atelectasis.

## 2020-06-16 ENCOUNTER — Ambulatory Visit
Admission: RE | Admit: 2020-06-16 | Discharge: 2020-06-16 | Disposition: A | Discharge status: Home / Self Care | Payer: Medicare HMO | Source: Ambulatory Visit | Attending: Endocrinology | Admitting: Endocrinology

## 2020-06-16 DIAGNOSIS — E049 Nontoxic goiter, unspecified: Secondary | ICD-10-CM

## 2020-06-16 DIAGNOSIS — E041 Nontoxic single thyroid nodule: Secondary | ICD-10-CM | POA: Diagnosis not present

## 2020-07-08 DIAGNOSIS — H9313 Tinnitus, bilateral: Secondary | ICD-10-CM | POA: Diagnosis not present

## 2020-07-08 DIAGNOSIS — H903 Sensorineural hearing loss, bilateral: Secondary | ICD-10-CM | POA: Diagnosis not present

## 2020-07-11 DIAGNOSIS — R69 Illness, unspecified: Secondary | ICD-10-CM | POA: Diagnosis not present

## 2020-07-25 DIAGNOSIS — R69 Illness, unspecified: Secondary | ICD-10-CM | POA: Diagnosis not present

## 2020-09-20 DIAGNOSIS — E559 Vitamin D deficiency, unspecified: Secondary | ICD-10-CM | POA: Diagnosis not present

## 2020-09-20 DIAGNOSIS — I10 Essential (primary) hypertension: Secondary | ICD-10-CM | POA: Diagnosis not present

## 2020-09-20 DIAGNOSIS — M1990 Unspecified osteoarthritis, unspecified site: Secondary | ICD-10-CM | POA: Diagnosis not present

## 2020-09-20 DIAGNOSIS — Z86711 Personal history of pulmonary embolism: Secondary | ICD-10-CM | POA: Diagnosis not present

## 2020-10-10 DIAGNOSIS — H903 Sensorineural hearing loss, bilateral: Secondary | ICD-10-CM | POA: Diagnosis not present

## 2020-11-01 DIAGNOSIS — H918X9 Other specified hearing loss, unspecified ear: Secondary | ICD-10-CM | POA: Diagnosis not present

## 2020-11-09 DIAGNOSIS — H401131 Primary open-angle glaucoma, bilateral, mild stage: Secondary | ICD-10-CM | POA: Diagnosis not present

## 2020-11-09 DIAGNOSIS — H0102A Squamous blepharitis right eye, upper and lower eyelids: Secondary | ICD-10-CM | POA: Diagnosis not present

## 2020-11-09 DIAGNOSIS — H35373 Puckering of macula, bilateral: Secondary | ICD-10-CM | POA: Diagnosis not present

## 2020-11-09 DIAGNOSIS — H16223 Keratoconjunctivitis sicca, not specified as Sjogren's, bilateral: Secondary | ICD-10-CM | POA: Diagnosis not present

## 2020-11-09 DIAGNOSIS — H26491 Other secondary cataract, right eye: Secondary | ICD-10-CM | POA: Diagnosis not present

## 2020-11-09 DIAGNOSIS — H1045 Other chronic allergic conjunctivitis: Secondary | ICD-10-CM | POA: Diagnosis not present

## 2020-11-09 DIAGNOSIS — Z9889 Other specified postprocedural states: Secondary | ICD-10-CM | POA: Diagnosis not present

## 2020-11-09 DIAGNOSIS — Z961 Presence of intraocular lens: Secondary | ICD-10-CM | POA: Diagnosis not present

## 2020-11-09 DIAGNOSIS — H0102B Squamous blepharitis left eye, upper and lower eyelids: Secondary | ICD-10-CM | POA: Diagnosis not present

## 2020-12-01 DIAGNOSIS — M1990 Unspecified osteoarthritis, unspecified site: Secondary | ICD-10-CM | POA: Diagnosis not present

## 2020-12-01 DIAGNOSIS — Z86711 Personal history of pulmonary embolism: Secondary | ICD-10-CM | POA: Diagnosis not present

## 2020-12-01 DIAGNOSIS — E559 Vitamin D deficiency, unspecified: Secondary | ICD-10-CM | POA: Diagnosis not present

## 2020-12-01 DIAGNOSIS — I10 Essential (primary) hypertension: Secondary | ICD-10-CM | POA: Diagnosis not present

## 2021-01-10 DIAGNOSIS — G4709 Other insomnia: Secondary | ICD-10-CM | POA: Diagnosis not present

## 2021-01-10 DIAGNOSIS — Z86711 Personal history of pulmonary embolism: Secondary | ICD-10-CM | POA: Diagnosis not present

## 2021-01-10 DIAGNOSIS — E559 Vitamin D deficiency, unspecified: Secondary | ICD-10-CM | POA: Diagnosis not present

## 2021-01-10 DIAGNOSIS — I10 Essential (primary) hypertension: Secondary | ICD-10-CM | POA: Diagnosis not present

## 2021-01-10 DIAGNOSIS — M1990 Unspecified osteoarthritis, unspecified site: Secondary | ICD-10-CM | POA: Diagnosis not present

## 2021-02-07 DIAGNOSIS — I10 Essential (primary) hypertension: Secondary | ICD-10-CM | POA: Diagnosis not present

## 2021-02-07 DIAGNOSIS — G4709 Other insomnia: Secondary | ICD-10-CM | POA: Diagnosis not present

## 2021-02-07 DIAGNOSIS — Z86711 Personal history of pulmonary embolism: Secondary | ICD-10-CM | POA: Diagnosis not present

## 2021-02-07 DIAGNOSIS — M1990 Unspecified osteoarthritis, unspecified site: Secondary | ICD-10-CM | POA: Diagnosis not present

## 2021-02-07 DIAGNOSIS — E559 Vitamin D deficiency, unspecified: Secondary | ICD-10-CM | POA: Diagnosis not present

## 2021-02-10 IMAGING — MG DIGITAL SCREENING BILAT W/ TOMO W/ CAD
8 series · 8 of 24 positions shown · non-contrast
Comparison: Previous exam(s).

CLINICAL DATA: Screening.

EXAM:
DIGITAL SCREENING BILATERAL MAMMOGRAM WITH TOMO AND CAD

[R MLO synth-2D]
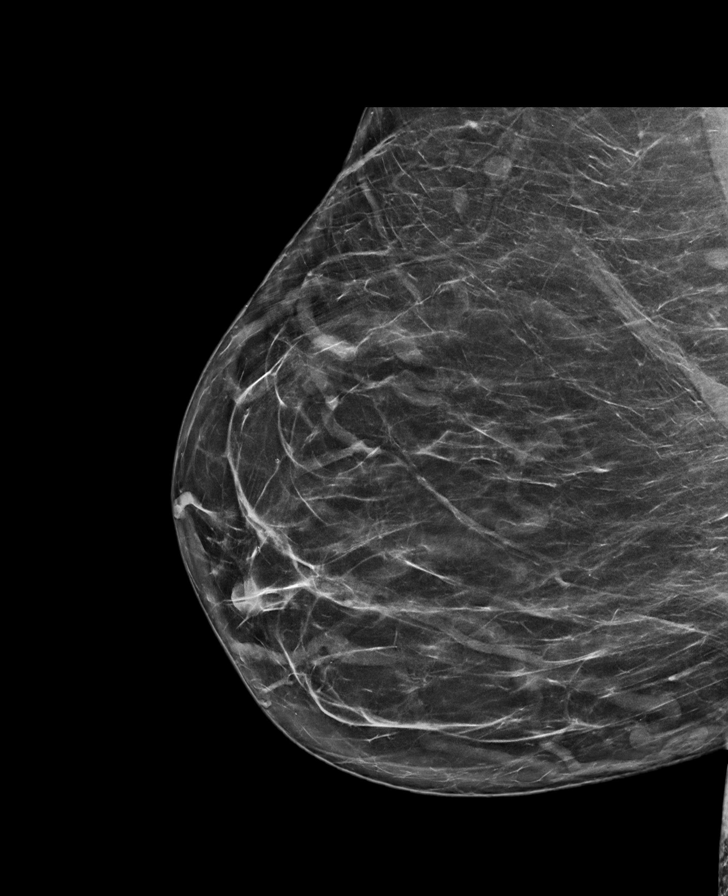

[L CC synth-2D]
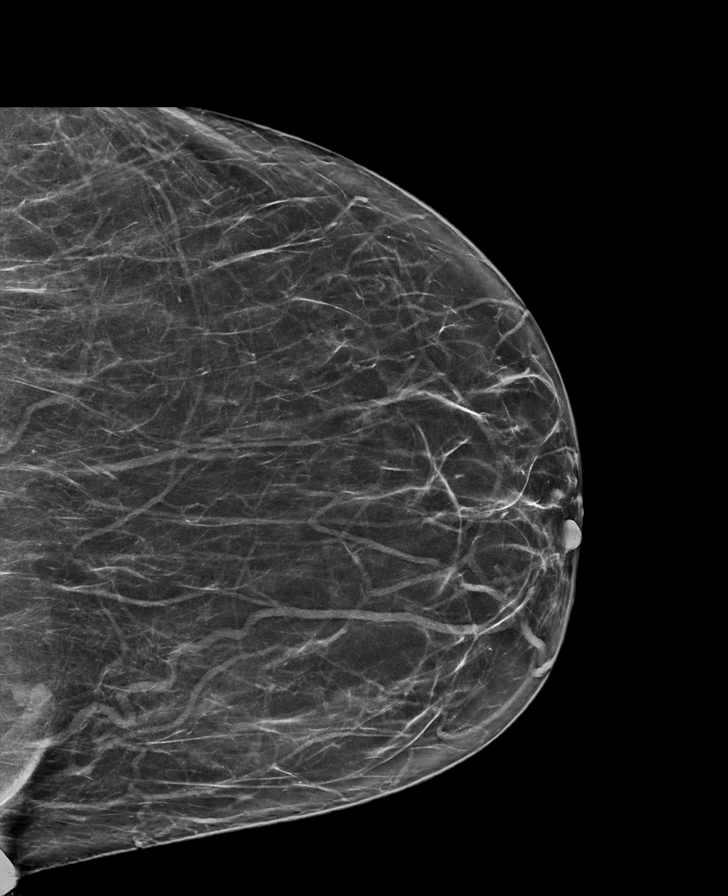

[R CC synth-2D]
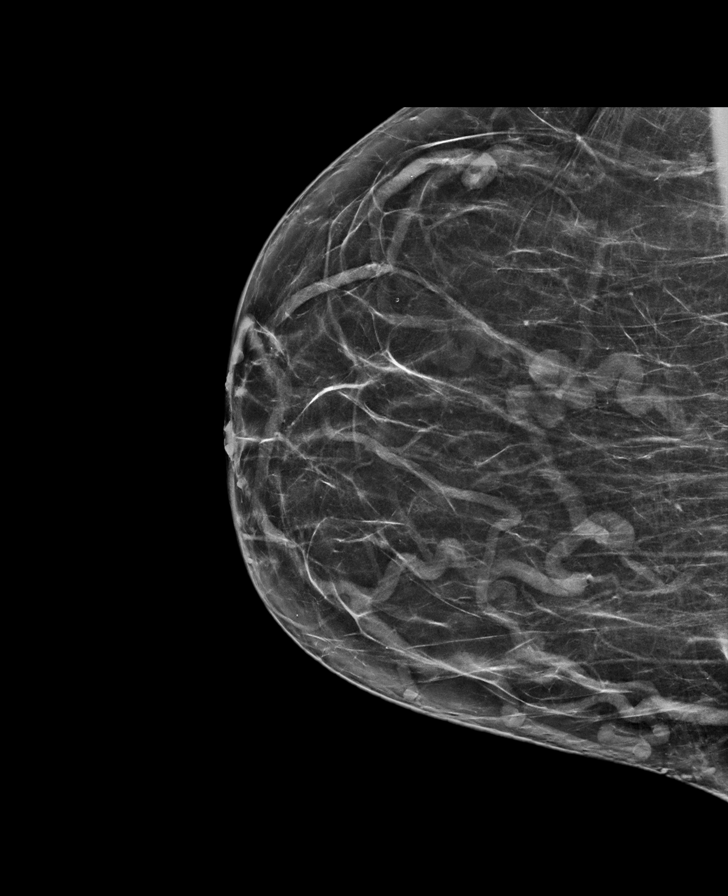

[L MLO synth-2D]
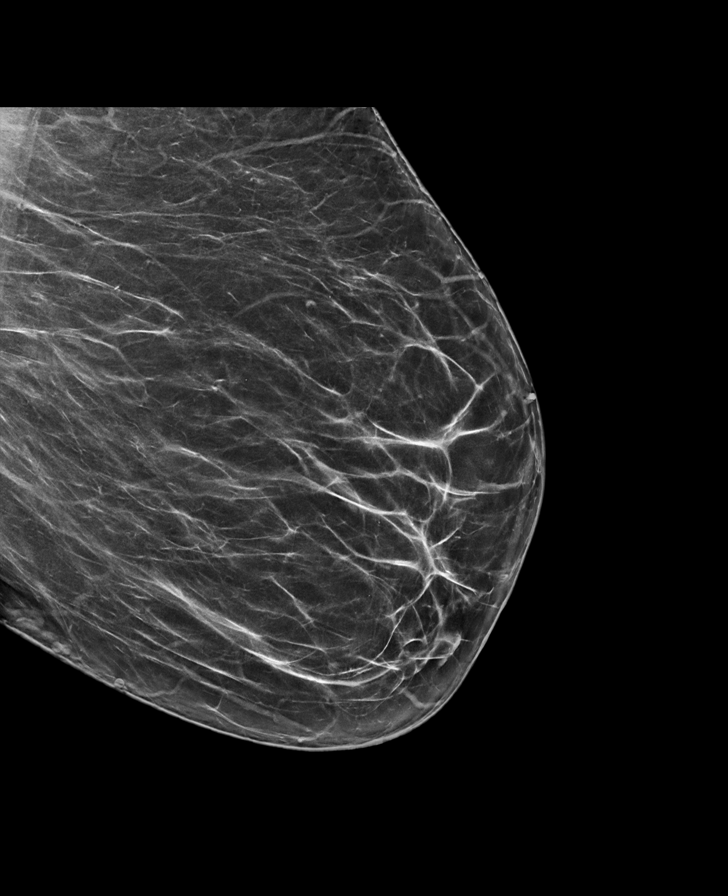

[R MLO tomo · tomo slice 41/81.0]
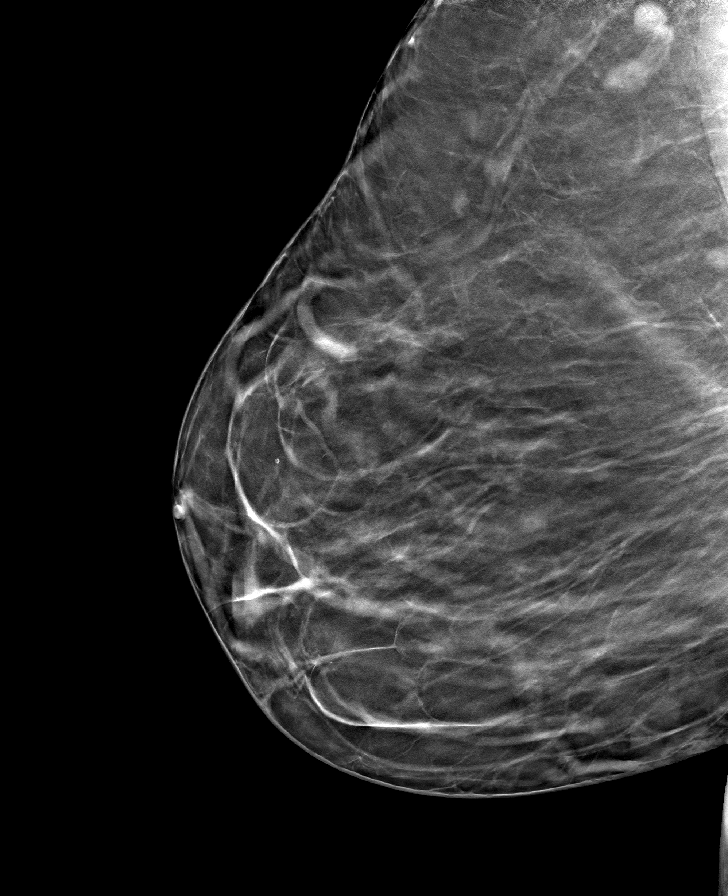

[L MLO tomo · tomo slice 39/78.0]
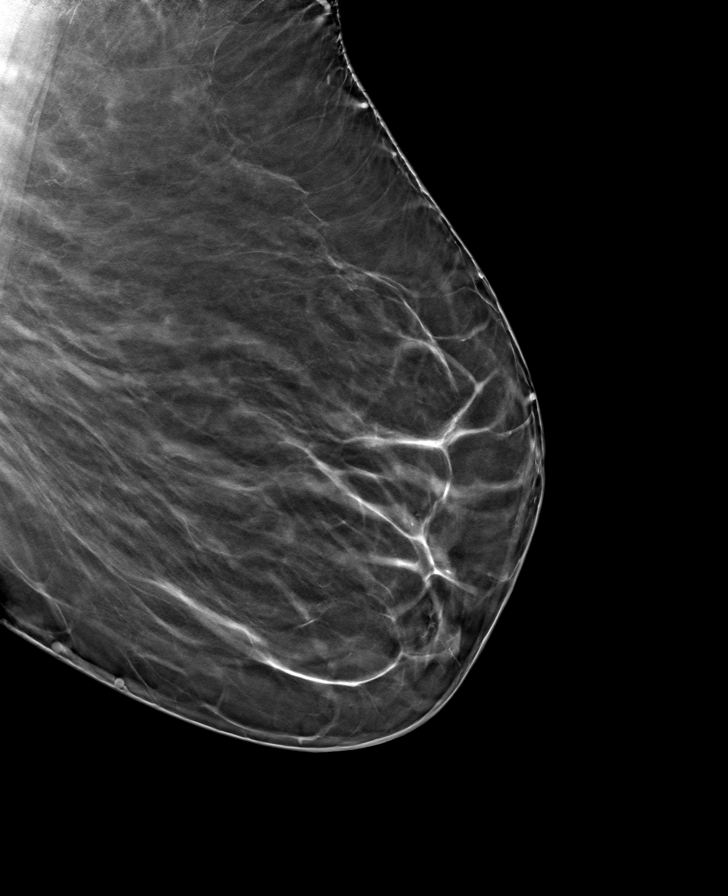

[R CC tomo · tomo slice 35/69.0]
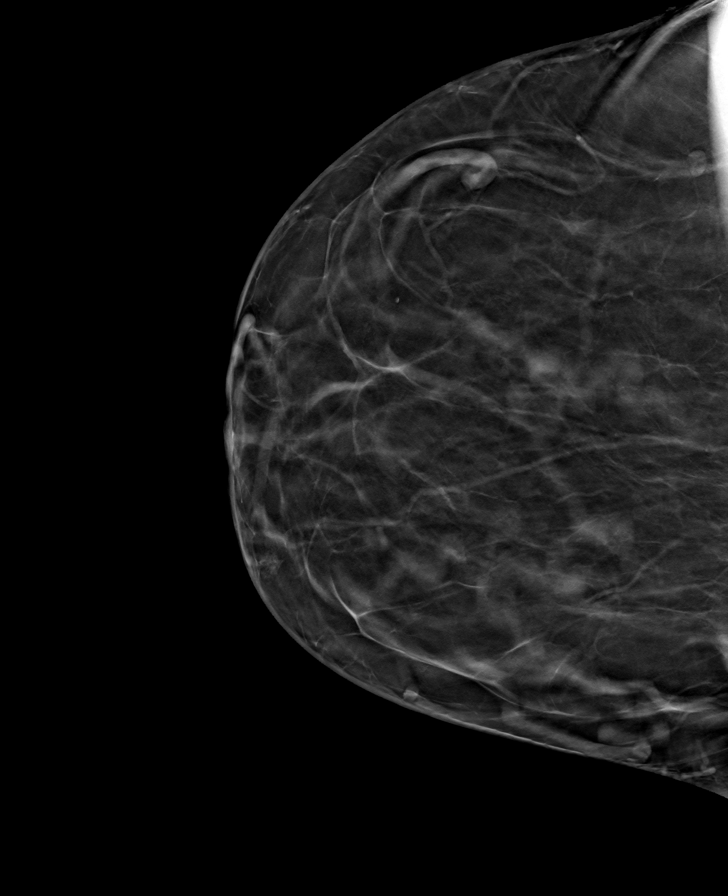

[L CC tomo · tomo slice 37/73.0]
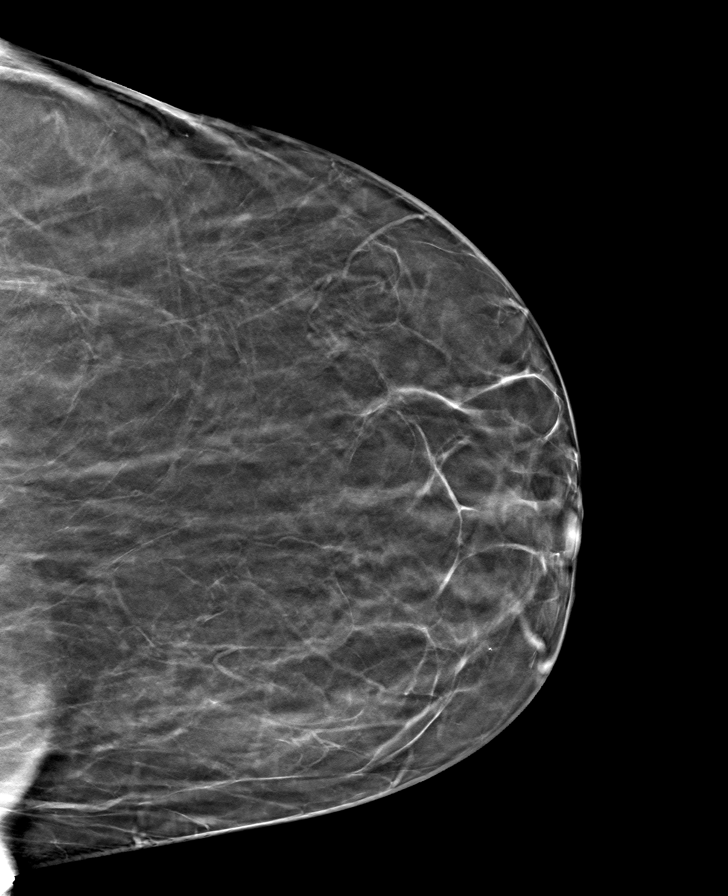

[8 of 24 positions shown; findings below may reference images not displayed]

ACR Breast Density Category b: There are scattered areas of
fibroglandular density.
FINDINGS: There are no findings suspicious for malignancy. Images were
processed with CAD.
IMPRESSION: No mammographic evidence of malignancy. A result letter of this
screening mammogram will be mailed directly to the patient.

RECOMMENDATION:
Screening mammogram in one year. (Code:CN-[id number])

BI-RADS CATEGORY  1: Negative.

## 2021-03-03 IMAGING — US US THYROID
1 series · 13 of 25 positions shown · non-contrast
Comparison: 06/12/2019

CLINICAL DATA: 68-year-old female with a history of thyroid goiter

EXAM:
THYROID ULTRASOUND
TECHNIQUE: Ultrasound examination of the thyroid gland and adjacent soft
tissues was performed.

[Series 1: us thyroid · 0.09mm/px · 13 of 45 slices shown]
[im 1/45]
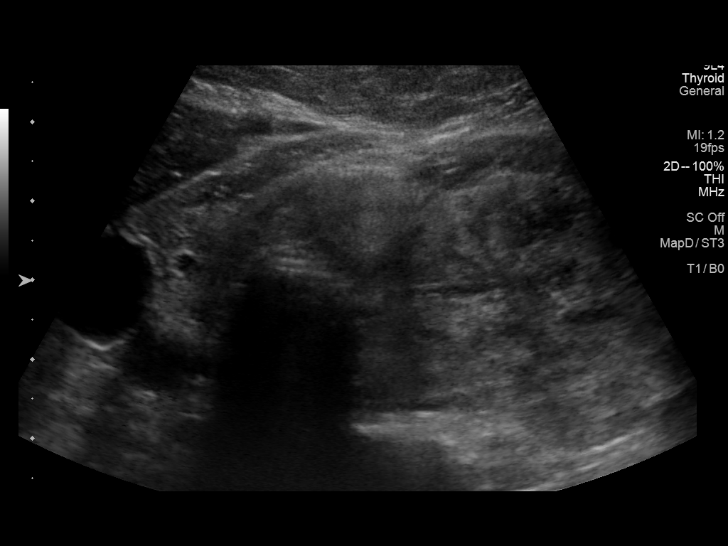
[im 4/45]
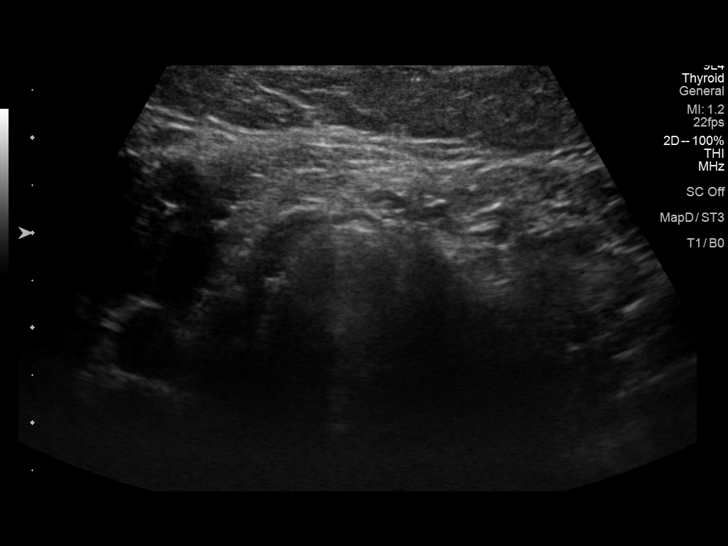
[im 8/45]
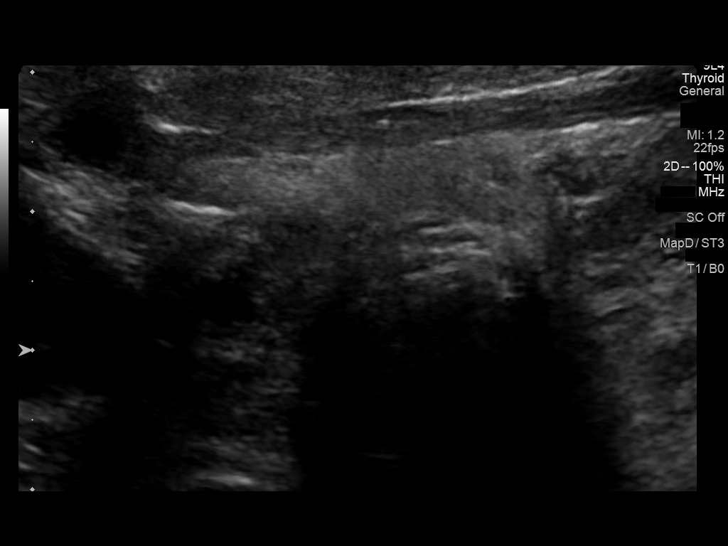
[im 12/45]
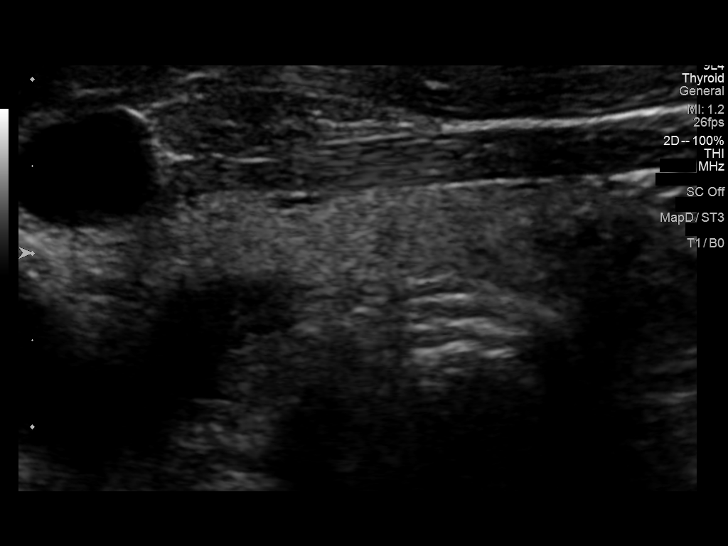
[im 15/45]
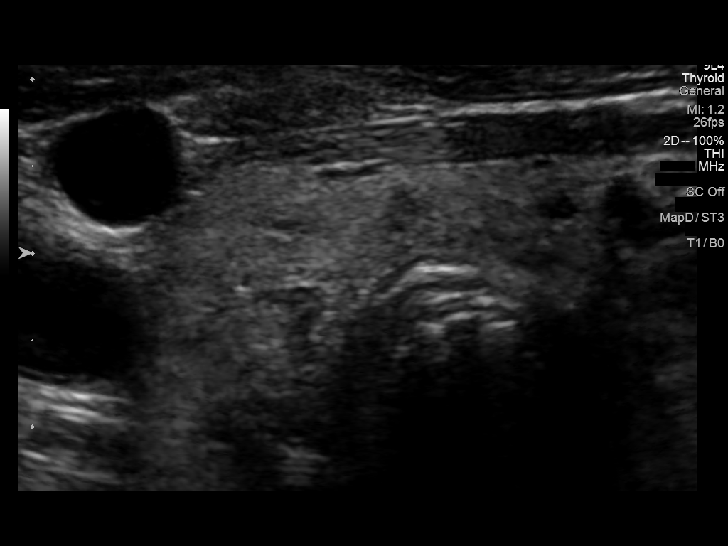
[im 19/45]
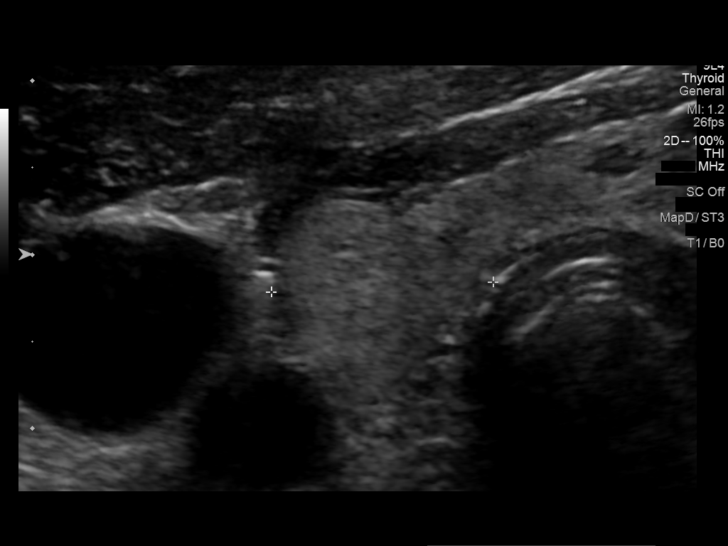
[im 23/45]
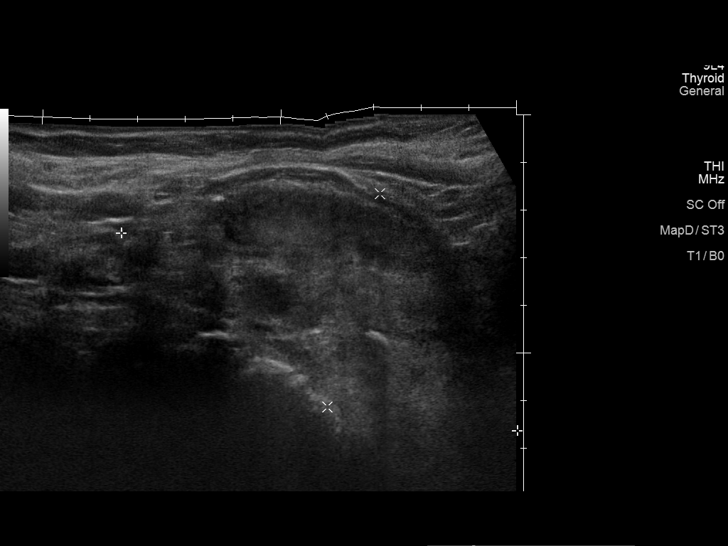
[im 26/45]
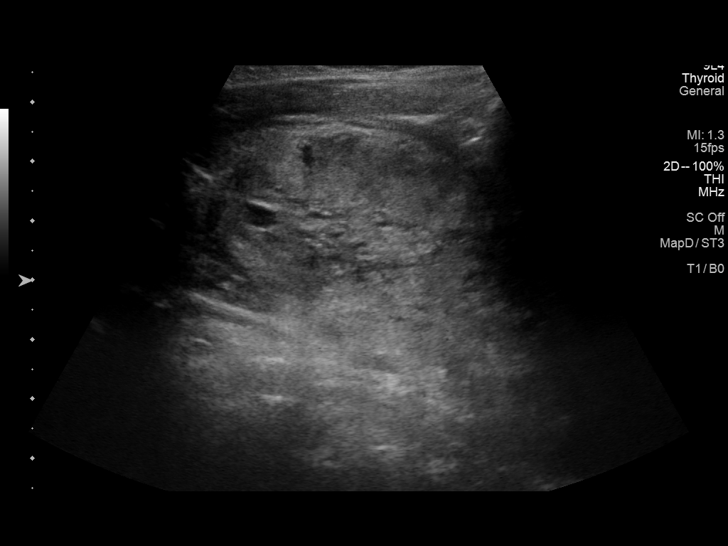
[im 30/45]
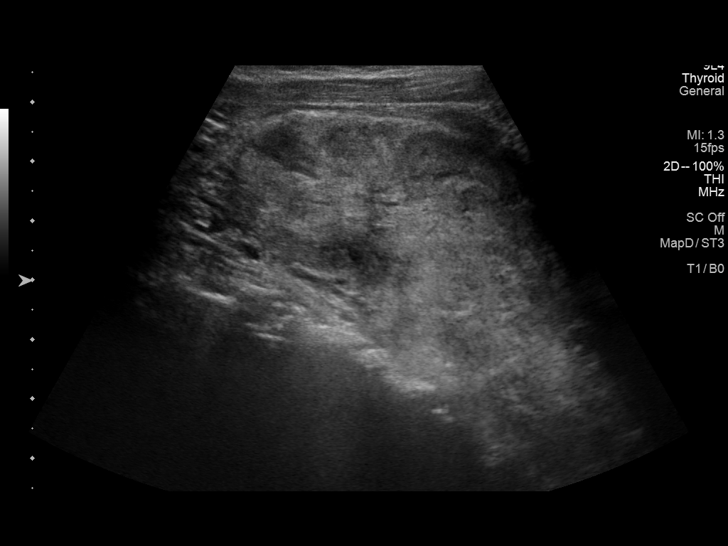
[im 34/45]
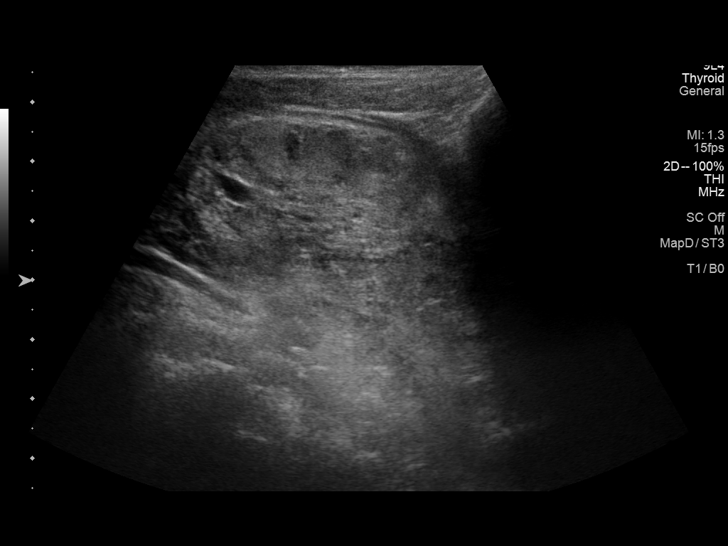
[im 37/45]
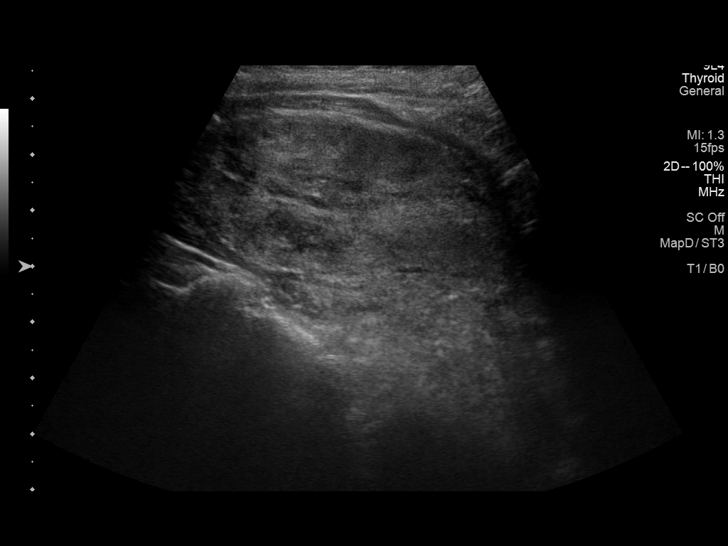
[im 41/45]
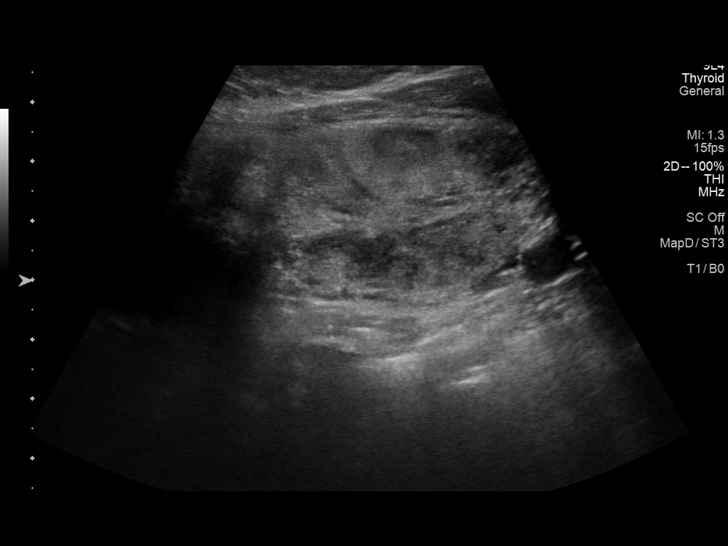
[im 45/45]
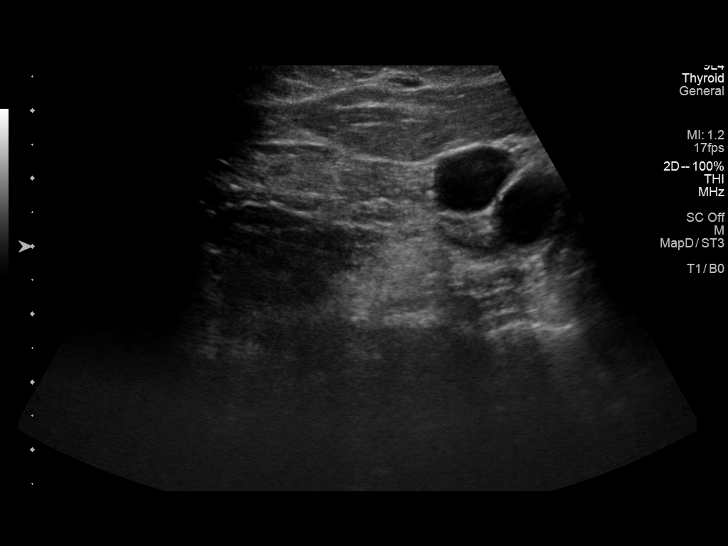

[13 of 25 positions shown; findings below may reference images not displayed]

FINDINGS: Parenchymal Echotexture: Moderately heterogenous

Isthmus: 0.9 cm

Right lobe: 4.2 cm x 1.5 cm x 1.3 cm

Left lobe: 9.3 cm x 4.6 cm x 4.6 cm

_________________________________________________________

Estimated total number of nodules >/= 1 cm: 2

Number of spongiform nodules >/=  2 cm not described below (TR1): 0

Number of mixed cystic and solid nodules >/= 1.5 cm not described
below (TR2): 0

_________________________________________________________

Nodule labeled 1 in the right mid thyroid, 1.5 cm. This remains TR 3
and meets criteria for surveillance.

Similar appearance of the left thyroid, either a nodule measuring
9.5 cm, or heterogeneous left thyroid tissue with enlargement of the
left thyroid.

No adenopathy
IMPRESSION: Multinodular heterogeneous thyroid again demonstrated.

Right mid thyroid nodule (TR 3, 1.5 cm) meets criteria for continued
surveillance, as designated by the newly established ACR TI-RADS
criteria. Surveillance ultrasound study recommended to be performed
annually up to 5 years.

Relatively unchanged appearance in size of the left thyroid, either
replacement with a large TR 3 nodule, or heterogeneous left thyroid
enlargement. Either continued surveillance or biopsy may be
reasonable.

Recommendations follow those established by the new ACR TI-RADS
criteria ([HOSPITAL] 8176;[DATE]).

## 2021-03-08 DIAGNOSIS — E7800 Pure hypercholesterolemia, unspecified: Secondary | ICD-10-CM | POA: Diagnosis not present

## 2021-03-08 DIAGNOSIS — G4709 Other insomnia: Secondary | ICD-10-CM | POA: Diagnosis not present

## 2021-03-08 DIAGNOSIS — Z86711 Personal history of pulmonary embolism: Secondary | ICD-10-CM | POA: Diagnosis not present

## 2021-03-08 DIAGNOSIS — Z0000 Encounter for general adult medical examination without abnormal findings: Secondary | ICD-10-CM | POA: Diagnosis not present

## 2021-03-08 DIAGNOSIS — Z131 Encounter for screening for diabetes mellitus: Secondary | ICD-10-CM | POA: Diagnosis not present

## 2021-03-08 DIAGNOSIS — M1990 Unspecified osteoarthritis, unspecified site: Secondary | ICD-10-CM | POA: Diagnosis not present

## 2021-03-08 DIAGNOSIS — E559 Vitamin D deficiency, unspecified: Secondary | ICD-10-CM | POA: Diagnosis not present

## 2021-03-08 DIAGNOSIS — I10 Essential (primary) hypertension: Secondary | ICD-10-CM | POA: Diagnosis not present

## 2021-03-13 DIAGNOSIS — H16223 Keratoconjunctivitis sicca, not specified as Sjogren's, bilateral: Secondary | ICD-10-CM | POA: Diagnosis not present

## 2021-03-13 DIAGNOSIS — H35373 Puckering of macula, bilateral: Secondary | ICD-10-CM | POA: Diagnosis not present

## 2021-03-13 DIAGNOSIS — H401131 Primary open-angle glaucoma, bilateral, mild stage: Secondary | ICD-10-CM | POA: Diagnosis not present

## 2021-03-13 DIAGNOSIS — H0102B Squamous blepharitis left eye, upper and lower eyelids: Secondary | ICD-10-CM | POA: Diagnosis not present

## 2021-03-13 DIAGNOSIS — H1045 Other chronic allergic conjunctivitis: Secondary | ICD-10-CM | POA: Diagnosis not present

## 2021-03-13 DIAGNOSIS — Z9889 Other specified postprocedural states: Secondary | ICD-10-CM | POA: Diagnosis not present

## 2021-03-13 DIAGNOSIS — H43812 Vitreous degeneration, left eye: Secondary | ICD-10-CM | POA: Diagnosis not present

## 2021-03-13 DIAGNOSIS — H26491 Other secondary cataract, right eye: Secondary | ICD-10-CM | POA: Diagnosis not present

## 2021-03-13 DIAGNOSIS — H0102A Squamous blepharitis right eye, upper and lower eyelids: Secondary | ICD-10-CM | POA: Diagnosis not present

## 2021-03-13 DIAGNOSIS — Z961 Presence of intraocular lens: Secondary | ICD-10-CM | POA: Diagnosis not present

## 2021-05-09 ENCOUNTER — Other Ambulatory Visit: Payer: Self-pay | Admitting: Physician Assistant

## 2021-05-09 DIAGNOSIS — Z1231 Encounter for screening mammogram for malignant neoplasm of breast: Secondary | ICD-10-CM

## 2021-05-24 DIAGNOSIS — I10 Essential (primary) hypertension: Secondary | ICD-10-CM | POA: Diagnosis not present

## 2021-05-24 DIAGNOSIS — M1990 Unspecified osteoarthritis, unspecified site: Secondary | ICD-10-CM | POA: Diagnosis not present

## 2021-05-24 DIAGNOSIS — Z86711 Personal history of pulmonary embolism: Secondary | ICD-10-CM | POA: Diagnosis not present

## 2021-05-24 DIAGNOSIS — R600 Localized edema: Secondary | ICD-10-CM | POA: Diagnosis not present

## 2021-05-24 DIAGNOSIS — G4709 Other insomnia: Secondary | ICD-10-CM | POA: Diagnosis not present

## 2021-05-24 DIAGNOSIS — E559 Vitamin D deficiency, unspecified: Secondary | ICD-10-CM | POA: Diagnosis not present

## 2021-05-24 DIAGNOSIS — R0602 Shortness of breath: Secondary | ICD-10-CM | POA: Diagnosis not present

## 2021-06-06 DIAGNOSIS — I10 Essential (primary) hypertension: Secondary | ICD-10-CM | POA: Diagnosis not present

## 2021-06-06 DIAGNOSIS — R7301 Impaired fasting glucose: Secondary | ICD-10-CM | POA: Diagnosis not present

## 2021-06-06 DIAGNOSIS — E010 Iodine-deficiency related diffuse (endemic) goiter: Secondary | ICD-10-CM | POA: Diagnosis not present

## 2021-06-07 DIAGNOSIS — E559 Vitamin D deficiency, unspecified: Secondary | ICD-10-CM | POA: Diagnosis not present

## 2021-06-07 DIAGNOSIS — G4709 Other insomnia: Secondary | ICD-10-CM | POA: Diagnosis not present

## 2021-06-07 DIAGNOSIS — I10 Essential (primary) hypertension: Secondary | ICD-10-CM | POA: Diagnosis not present

## 2021-06-07 DIAGNOSIS — R600 Localized edema: Secondary | ICD-10-CM | POA: Diagnosis not present

## 2021-06-07 DIAGNOSIS — M1990 Unspecified osteoarthritis, unspecified site: Secondary | ICD-10-CM | POA: Diagnosis not present

## 2021-06-07 DIAGNOSIS — Z86711 Personal history of pulmonary embolism: Secondary | ICD-10-CM | POA: Diagnosis not present

## 2021-06-21 DIAGNOSIS — M79642 Pain in left hand: Secondary | ICD-10-CM | POA: Diagnosis not present

## 2021-06-21 DIAGNOSIS — E559 Vitamin D deficiency, unspecified: Secondary | ICD-10-CM | POA: Diagnosis not present

## 2021-06-21 DIAGNOSIS — R600 Localized edema: Secondary | ICD-10-CM | POA: Diagnosis not present

## 2021-06-21 DIAGNOSIS — R202 Paresthesia of skin: Secondary | ICD-10-CM | POA: Diagnosis not present

## 2021-06-21 DIAGNOSIS — I10 Essential (primary) hypertension: Secondary | ICD-10-CM | POA: Diagnosis not present

## 2021-06-21 DIAGNOSIS — M1990 Unspecified osteoarthritis, unspecified site: Secondary | ICD-10-CM | POA: Diagnosis not present

## 2021-06-21 DIAGNOSIS — Z86711 Personal history of pulmonary embolism: Secondary | ICD-10-CM | POA: Diagnosis not present

## 2021-06-21 DIAGNOSIS — G4709 Other insomnia: Secondary | ICD-10-CM | POA: Diagnosis not present

## 2021-06-26 DIAGNOSIS — G5622 Lesion of ulnar nerve, left upper limb: Secondary | ICD-10-CM | POA: Diagnosis not present

## 2021-06-26 DIAGNOSIS — G5602 Carpal tunnel syndrome, left upper limb: Secondary | ICD-10-CM | POA: Insufficient documentation

## 2021-06-30 ENCOUNTER — Other Ambulatory Visit: Payer: Self-pay

## 2021-06-30 ENCOUNTER — Ambulatory Visit
Admission: RE | Admit: 2021-06-30 | Discharge: 2021-06-30 | Disposition: A | Discharge status: Home / Self Care | Payer: Medicare Other | Source: Ambulatory Visit | Attending: Physician Assistant | Admitting: Physician Assistant

## 2021-06-30 DIAGNOSIS — Z1231 Encounter for screening mammogram for malignant neoplasm of breast: Secondary | ICD-10-CM

## 2021-07-12 DIAGNOSIS — G5602 Carpal tunnel syndrome, left upper limb: Secondary | ICD-10-CM | POA: Diagnosis not present

## 2021-07-12 DIAGNOSIS — G5622 Lesion of ulnar nerve, left upper limb: Secondary | ICD-10-CM | POA: Diagnosis not present

## 2021-08-21 DIAGNOSIS — H0102A Squamous blepharitis right eye, upper and lower eyelids: Secondary | ICD-10-CM | POA: Diagnosis not present

## 2021-08-21 DIAGNOSIS — H401131 Primary open-angle glaucoma, bilateral, mild stage: Secondary | ICD-10-CM | POA: Diagnosis not present

## 2021-08-21 DIAGNOSIS — H35373 Puckering of macula, bilateral: Secondary | ICD-10-CM | POA: Diagnosis not present

## 2021-08-21 DIAGNOSIS — Z961 Presence of intraocular lens: Secondary | ICD-10-CM | POA: Diagnosis not present

## 2021-08-21 DIAGNOSIS — Z9889 Other specified postprocedural states: Secondary | ICD-10-CM | POA: Diagnosis not present

## 2021-08-21 DIAGNOSIS — H43812 Vitreous degeneration, left eye: Secondary | ICD-10-CM | POA: Diagnosis not present

## 2021-08-21 DIAGNOSIS — H26491 Other secondary cataract, right eye: Secondary | ICD-10-CM | POA: Diagnosis not present

## 2021-08-21 DIAGNOSIS — H16223 Keratoconjunctivitis sicca, not specified as Sjogren's, bilateral: Secondary | ICD-10-CM | POA: Diagnosis not present

## 2021-08-21 DIAGNOSIS — H1045 Other chronic allergic conjunctivitis: Secondary | ICD-10-CM | POA: Diagnosis not present

## 2021-08-21 DIAGNOSIS — H0102B Squamous blepharitis left eye, upper and lower eyelids: Secondary | ICD-10-CM | POA: Diagnosis not present

## 2021-09-18 ENCOUNTER — Emergency Department (HOSPITAL_COMMUNITY)
Admission: EM | Admit: 2021-09-18 | Discharge: 2021-09-19 | Discharge status: Against Medical Advice | Payer: Medicare Other | Attending: Emergency Medicine | Admitting: Emergency Medicine

## 2021-09-18 ENCOUNTER — Emergency Department (HOSPITAL_BASED_OUTPATIENT_CLINIC_OR_DEPARTMENT_OTHER): Admit: 2021-09-18 | Discharge: 2021-09-18 | Disposition: A | Discharge status: Home / Self Care | Payer: Medicare Other

## 2021-09-18 ENCOUNTER — Emergency Department (HOSPITAL_COMMUNITY): Payer: Medicare Other

## 2021-09-18 ENCOUNTER — Encounter (HOSPITAL_COMMUNITY): Payer: Self-pay | Admitting: Emergency Medicine

## 2021-09-18 DIAGNOSIS — E039 Hypothyroidism, unspecified: Secondary | ICD-10-CM | POA: Insufficient documentation

## 2021-09-18 DIAGNOSIS — Z86718 Personal history of other venous thrombosis and embolism: Secondary | ICD-10-CM | POA: Diagnosis not present

## 2021-09-18 DIAGNOSIS — E559 Vitamin D deficiency, unspecified: Secondary | ICD-10-CM | POA: Diagnosis not present

## 2021-09-18 DIAGNOSIS — M79604 Pain in right leg: Secondary | ICD-10-CM | POA: Diagnosis not present

## 2021-09-18 DIAGNOSIS — Z79899 Other long term (current) drug therapy: Secondary | ICD-10-CM | POA: Diagnosis not present

## 2021-09-18 DIAGNOSIS — R918 Other nonspecific abnormal finding of lung field: Secondary | ICD-10-CM | POA: Diagnosis not present

## 2021-09-18 DIAGNOSIS — R911 Solitary pulmonary nodule: Secondary | ICD-10-CM | POA: Diagnosis not present

## 2021-09-18 DIAGNOSIS — R590 Localized enlarged lymph nodes: Secondary | ICD-10-CM | POA: Diagnosis not present

## 2021-09-18 DIAGNOSIS — R0789 Other chest pain: Secondary | ICD-10-CM | POA: Insufficient documentation

## 2021-09-18 DIAGNOSIS — R2240 Localized swelling, mass and lump, unspecified lower limb: Secondary | ICD-10-CM | POA: Insufficient documentation

## 2021-09-18 DIAGNOSIS — R609 Edema, unspecified: Secondary | ICD-10-CM

## 2021-09-18 DIAGNOSIS — R059 Cough, unspecified: Secondary | ICD-10-CM | POA: Insufficient documentation

## 2021-09-18 DIAGNOSIS — R079 Chest pain, unspecified: Secondary | ICD-10-CM | POA: Diagnosis not present

## 2021-09-18 DIAGNOSIS — I10 Essential (primary) hypertension: Secondary | ICD-10-CM | POA: Diagnosis not present

## 2021-09-18 DIAGNOSIS — R0602 Shortness of breath: Secondary | ICD-10-CM | POA: Diagnosis not present

## 2021-09-18 DIAGNOSIS — J398 Other specified diseases of upper respiratory tract: Secondary | ICD-10-CM | POA: Diagnosis not present

## 2021-09-18 DIAGNOSIS — G4709 Other insomnia: Secondary | ICD-10-CM | POA: Diagnosis not present

## 2021-09-18 DIAGNOSIS — Z0389 Encounter for observation for other suspected diseases and conditions ruled out: Secondary | ICD-10-CM | POA: Diagnosis not present

## 2021-09-18 DIAGNOSIS — M1990 Unspecified osteoarthritis, unspecified site: Secondary | ICD-10-CM | POA: Diagnosis not present

## 2021-09-18 DIAGNOSIS — Z86711 Personal history of pulmonary embolism: Secondary | ICD-10-CM | POA: Diagnosis not present

## 2021-09-18 DIAGNOSIS — R600 Localized edema: Secondary | ICD-10-CM | POA: Diagnosis not present

## 2021-09-18 LAB — BASIC METABOLIC PANEL
Anion gap: 9 (ref 5–15)
BUN: 10 mg/dL (ref 8–23)
CO2: 25 mmol/L (ref 22–32)
Calcium: 9 mg/dL (ref 8.9–10.3)
Chloride: 104 mmol/L (ref 98–111)
Creatinine, Ser: 0.77 mg/dL (ref 0.44–1.00)
GFR, Estimated: >60 mL/min (ref >60)
Glucose, Bld: 91 mg/dL (ref 70–99)
Potassium: 4 mmol/L (ref 3.5–5.1)
Sodium: 138 mmol/L (ref 135–145)

## 2021-09-18 LAB — CBC
HCT: 40 % (ref 36.0–46.0)
Hemoglobin: 12.1 g/dL (ref 12.0–15.0)
MCH: 24.5 pg — ABNORMAL LOW (ref 26.0–34.0)
MCHC: 30.3 g/dL (ref 30.0–36.0)
MCV: 81.1 fL (ref 80.0–100.0)
Platelets: 294 10*3/uL (ref 150–400)
RBC: 4.93 MIL/uL (ref 3.87–5.11)
RDW: 16 % — ABNORMAL HIGH (ref 11.5–15.5)
WBC: 4.6 10*3/uL (ref 4.0–10.5)
nRBC: 0 % (ref 0.0–0.2)

## 2021-09-18 LAB — TROPONIN I (HIGH SENSITIVITY)
Troponin I (High Sensitivity): 5 ng/L (ref <18)
Troponin I (High Sensitivity): 5 ng/L (ref <18)

## 2021-09-18 LAB — BRAIN NATRIURETIC PEPTIDE: B Natriuretic Peptide: 4.8 pg/mL (ref 0.0–100.0)

## 2021-09-18 NOTE — ED Notes (Signed)
Called pt for vitals, no response.

## 2021-09-18 NOTE — ED Triage Notes (Signed)
Pt sent from PCP with SOB and chest pain since the weekend. Central chest pressure and tightness. Pt on xarelto due to DVT.

## 2021-09-18 NOTE — Progress Notes (Signed)
Bilateral lower extremity venous duplex completed. Refer to "CV Proc" under chart review to view preliminary results.  09/18/2021 2:30 PM Kelby Aline., MHA, RVT, RDCS, RDMS

## 2021-09-18 NOTE — ED Provider Notes (Signed)
Emergency Medicine Provider Triage Evaluation Note  Ariel Henry , a 70 y.o. female  was evaluated in triage.  Pt complains of worsening shortness of breath and centralized chest pressure that is worsened with exertion.  No radiation of pain.  History of DVT in the right lower extremity on Xarelto.  Additional history of PE in 2018.  Review of Systems  Positive: Shortness of breath, chest pressure Negative: Nausea, vomiting, fevers, chills, passing out, palpitation  Physical Exam  BP (!) 143/76 (BP Location: Left Wrist)   Pulse 78   Temp 98.2 F (36.8 C) (Oral)   Resp 12   SpO2 95%  Gen:   Awake, no distress   Resp:  Normal effort  MSK:   Moves extremities without difficulty  Other:  Increased work of breathing, however lungs are CTA B.  RRR Versus G.  Bilateral lower extremity edema, 3+ LE, 2+ in the left leg.  Medical Decision Making  Medically screening exam initiated at 1:23 PM.  Appropriate orders placed.  [name] was informed that the remainder of the evaluation will be completed by another provider, this initial triage assessment does not replace that evaluation, and the importance of remaining in the ED until their evaluation is complete.  This chart was dictated using voice recognition software, Dragon. Despite the best efforts of this provider to proofread and correct errors, errors may still occur which can change documentation meaning.    [name], PA-C 09/18/21 1324    [name], MD 09/18/21 [phone]

## 2021-09-19 ENCOUNTER — Emergency Department (HOSPITAL_COMMUNITY): Payer: Medicare Other

## 2021-09-19 DIAGNOSIS — R590 Localized enlarged lymph nodes: Secondary | ICD-10-CM | POA: Diagnosis not present

## 2021-09-19 DIAGNOSIS — Z0389 Encounter for observation for other suspected diseases and conditions ruled out: Secondary | ICD-10-CM | POA: Diagnosis not present

## 2021-09-19 DIAGNOSIS — R918 Other nonspecific abnormal finding of lung field: Secondary | ICD-10-CM | POA: Diagnosis not present

## 2021-09-19 DIAGNOSIS — J398 Other specified diseases of upper respiratory tract: Secondary | ICD-10-CM | POA: Diagnosis not present

## 2021-09-19 MED ORDER — IOHEXOL 350 MG/ML SOLN
80.0000 mL | Freq: Once | INTRAVENOUS | Status: AC | PRN
  Administered 2021-09-19: 80 mL via INTRAVENOUS

## 2021-09-19 NOTE — Discharge Instructions (Signed)
Your CT scan did not show a blood clot in the lung.  The blood test that were done on you today make it very unlikely that you are having heart attack.  Please discuss these results with your family doctor.  Call them tomorrow and see when they want to see you back in the office.  Please return for worsening symptoms worsening chest pain shortness of breath upon exertion.  Please take your medications as prescribed.

## 2021-09-19 NOTE — ED Provider Notes (Signed)
[facility] EMERGENCY DEPARTMENT Provider Note   CSN: [id number] Arrival date & time: 09/18/21  1247     History Chief Complaint  Patient presents with   Shortness of Breath   Chest Pain    Ariel Henry is a 70 y.o. female.  70 yo F with a chief complaints of shortness of breath on exertion.  She also feels a pressure that feels, like a tightness at the center of her chest.  This occurs when she gets up to try and walk.  Tells me that she does not walk much but when she tries to she experiences the symptoms.  She has had DVT as well as PE in the past.  She is on Xarelto but due to the cost of it when [place] she does not take it every day and has skipped a couple doses this week.  She denies hemoptysis.  She has had worsening leg edema.  Has seen her family doctor today who thought the leg swelling could be due to her amlodipine but also suggested that she come here for evaluation for PE or DVT.  The patient thought that maybe she had pneumonia.  Tells me that she has not really been coughing or having a fever.  Denies abdominal pain.  Pain seems to be worse with sitting up worse certain positions as well as ambulation.  She denies trauma.  She has trouble telling how long she has had the symptoms.  The history is provided by the patient.  Shortness of Breath Severity:  Moderate Onset quality:  Gradual Duration:  2 days Timing:  Constant Progression:  Worsening Chronicity:  New Relieved by:  Nothing Worsened by:  Nothing Ineffective treatments:  None tried Associated symptoms: chest pain and cough (mild)   Associated symptoms: no fever, no headaches, no vomiting and no wheezing   Chest Pain Associated symptoms: cough (mild) and shortness of breath   Associated symptoms: no dizziness, no fever, no headache, no nausea, no palpitations and no vomiting       Past Medical History:  Diagnosis Date   Arthritis    DVT (deep venous  thrombosis) (HCC)    Glaucoma    Neuromuscular disorder (HCC)    carpal tunnel both wrists   Obesity    Pneumonia    Thyroid disease     Patient Active Problem List   Diagnosis Date Noted   Right leg pain 12/22/2016   Glaucoma 12/22/2016   Hypothyroidism 12/22/2016   Pulmonary embolism ([place]) 12/21/2016   Acute respiratory failure ([place]) 12/14/2016   Special screening for malignant neoplasms, colon    Lipoma of colon     Past Surgical History:  Procedure Laterality Date   ABDOMINAL HYSTERECTOMY     bladder tack     COLONOSCOPY WITH PROPOFOL N/A 09/04/2016   Procedure: COLONOSCOPY WITH PROPOFOL;  Surgeon: Mauri Pole, MD;  Location: [facility];  Service: Endoscopy;  Laterality: N/A;   EYE SURGERY     bilateral cataract extraction     OB History   No obstetric history on file.     Family History  Problem Relation Age of Onset   Colon cancer Neg Hx     Social History   Tobacco Use   Smoking status: Never   Smokeless tobacco: Never  Substance Use Topics   Alcohol use: No   Drug use: No    Home Medications Prior to Admission medications   Medication Sig Start  Date End Date Taking? Authorizing Provider  albuterol (PROVENTIL HFA;VENTOLIN HFA) 108 (90 BASE) MCG/ACT inhaler Inhale 1-2 puffs into the lungs every 6 (six) hours as needed for wheezing. 07/12/13   Fredia Sorrow, MD  cetirizine (ZYRTEC) 10 MG tablet Take 1 tablet (10 mg total) by mouth daily. 05/10/18   Zigmund Gottron, NP  cholecalciferol (VITAMIN D) 1000 units tablet Take 1,000 Units by mouth daily.    [provider]  dorzolamide (TRUSOPT) 2 % ophthalmic solution 1 drop 3 (three) times daily.    [provider]  gabapentin (NEURONTIN) 300 MG capsule Take 300 mg by mouth at bedtime.    [provider]  gabapentin (NEURONTIN) 300 MG capsule Take 300 mg by mouth 2 (two) times daily. 600mg  in morning and afternoon, 300mg  at bedtime     [provider]   hydrochlorothiazide (HYDRODIURIL) 25 MG tablet Take 25 mg by mouth daily.    [provider]  hydrOXYzine (ATARAX/VISTARIL) 25 MG tablet Take 25 mg by mouth 4 (four) times daily as needed for anxiety.    [provider]  ipratropium (ATROVENT) 0.06 % nasal spray Place 2 sprays into both nostrils 3 (three) times daily. 05/10/18   Zigmund Gottron, NP  latanoprost (XALATAN) 0.005 % ophthalmic solution Place 1 drop into both eyes at bedtime.    [provider]  Polyethyl Glycol-Propyl Glycol (SYSTANE ULTRA OP) Apply to eye.    [provider]  predniSONE (DELTASONE) 5 MG tablet Take 1 tablet (5 mg total) by mouth 2 (two) times daily with a meal. 12/23/17   Robyn Haber, MD  pyridOXINE (VITAMIN B-6) 100 MG tablet Take 100 mg by mouth daily.    [provider]  Rivaroxaban 15 & 20 MG TBPK Take as directed on package: Start with one 15mg  tablet by mouth twice a day with food. On Day 22, switch to one 20mg  tablet once a day with food. 12/22/16   Mikhail, Velta Addison, DO  timolol (TIMOPTIC) 0.5 % ophthalmic solution 1 drop 2 (two) times daily.    [provider]  Timolol Maleate PF 0.5 % SOLN Apply 1 drop to eye every morning.    [provider]    Allergies    Patient has no known allergies.  Review of Systems   Review of Systems  Constitutional:  Negative for chills and fever.  HENT:  Negative for congestion and rhinorrhea.   Eyes:  Negative for redness and visual disturbance.  Respiratory:  Positive for cough (mild) and shortness of breath. Negative for wheezing.   Cardiovascular:  Positive for chest pain. Negative for palpitations.  Gastrointestinal:  Negative for nausea and vomiting.  Genitourinary:  Negative for dysuria and urgency.  Musculoskeletal:  Negative for arthralgias and myalgias.  Skin:  Negative for pallor and wound.  Neurological:  Negative for dizziness and headaches.   Physical Exam Updated Vital Signs BP (!)  138/92   Pulse 79   Temp 98.2 F (36.8 C)   Resp 15   SpO2 95%   Physical Exam Vitals and nursing note reviewed.  Constitutional:      General: She is not in acute distress.    Appearance: She is well-developed. She is not diaphoretic.     Comments: BMI 48  HENT:     Head: Normocephalic and atraumatic.  Eyes:     Pupils: Pupils are equal, round, and reactive to light.  Cardiovascular:     Rate and Rhythm: Normal rate and regular rhythm.  Heart sounds: No murmur heard.   No friction rub. No gallop.  Pulmonary:     Effort: Pulmonary effort is normal.     Breath sounds: No wheezing, rhonchi or rales.  Abdominal:     General: There is no distension.     Palpations: Abdomen is soft.     Tenderness: There is no abdominal tenderness.  Musculoskeletal:        General: No tenderness.     Cervical back: Normal range of motion and neck supple.  Skin:    General: Skin is warm and dry.  Neurological:     Mental Status: She is alert and oriented to person, place, and time.  Psychiatric:        Behavior: Behavior normal.    ED Results / Procedures / Treatments   Labs (all labs ordered are listed, but only abnormal results are displayed) Labs Reviewed  CBC - Abnormal; Notable for the following components:      Result Value   MCH 24.5 (*)    RDW 16.0 (*)    All other components within normal limits  BASIC METABOLIC PANEL  BRAIN NATRIURETIC PEPTIDE  TROPONIN I (HIGH SENSITIVITY)  TROPONIN I (HIGH SENSITIVITY)    EKG EKG Interpretation  Date/Time:  Monday September 18 2021 13:15:09 EDT Ventricular Rate:  86 PR Interval:  164 QRS Duration: 80 QT Interval:  346 QTC Calculation: 414 R Axis:   -4 Text Interpretation: Normal sinus rhythm with sinus arrhythmia Cannot rule out Anterior infarct , age undetermined Abnormal ECG No significant change since last tracing Confirmed by Deno Etienne [phone]) on 09/19/2021 2:13:54 AM  Radiology DG Chest 2 View  Result Date:  09/18/2021 CLINICAL DATA:  Pt states : Sob abd chest pains x 3 days, non smoker, hx htn, no diabetes:chart notes: Amayrany Cafaro , a 70 y.o. female was evaluated in triage. Pt complains of worsening shortness of breath and centralized chest pres.*comment was truncated*cp, sob EXAM: CHEST - 2 VIEW COMPARISON:  Short of breath.  Radiograph 09/28/2027, CT 12/21/2016 FINDINGS: Normal cardiac silhouette ectatic aorta. Prominent bulge along the RIGHT upper mediastinum corresponds to goiter seen on comparison CT. No effusion, infiltrate or pneumothorax. Small 4 mm RIGHT upper lobe pulmonary nodule unchanged from radiograph 09/28/2019 IMPRESSION: No acute cardiopulmonary process. Stable thyroid goiter and LEFT upper lobe pulmonary nodule. Electronically Signed   By: Suzy Bouchard M.D.   On: 09/18/2021 14:14   CT Angio Chest PE W and/or Wo Contrast  Result Date: 09/19/2021 CLINICAL DATA:  Suspected pulmonary embolism. EXAM: CT ANGIOGRAPHY CHEST WITH CONTRAST TECHNIQUE: Multidetector CT imaging of the chest was performed using the standard protocol during bolus administration of intravenous contrast. Multiplanar CT image reconstructions and MIPs were obtained to evaluate the vascular anatomy. CONTRAST:  44mL OMNIPAQUE IOHEXOL 350 MG/ML SOLN COMPARISON:  December 21, 2016 FINDINGS: Cardiovascular: Satisfactory opacification of the pulmonary arteries to the segmental level. No evidence of pulmonary embolism. Normal heart size. No pericardial effusion. Mediastinum/Nodes: No enlarged mediastinal, hilar, or axillary lymph nodes. The thyroid gland is markedly enlarged and unchanged in appearance when compared to the prior study. Subsequent left to right deviation of the trachea and proximal to mid esophagus is seen. Lungs/Pleura: Lungs are clear. There is elevation of the right hemidiaphragm. No pleural effusion or pneumothorax. Upper Abdomen: No acute abnormality. Musculoskeletal: No chest wall abnormality. No  acute or significant osseous findings. Review of the MIP images confirms the above findings. IMPRESSION: 1. No CT evidence of acute pulmonary  embolism or acute cardiopulmonary disease. 2. Stable thyroid goiter with resultant left to right deviation of the trachea and proximal to mid esophagus. Electronically Signed   By: Virgina Norfolk M.D.   On: 09/19/2021 03:22   VAS [place] LOWER EXTREMITY VENOUS (DVT) (7a-7p)  Result Date: 09/18/2021  Lower Venous DVT Study Patient Name:  SOLAE NORLING [facility]  Date of Exam:   09/18/2021 Medical Rec #: [id number]                Accession #:    [id number] Date of Birth: 11-Jul-1951               Patient Gender: F Patient Age:   30 years Exam Location:  [facility] Procedure:      VAS [place] LOWER EXTREMITY VENOUS (DVT) Referring Phys: [facility] SPONSELLER --------------------------------------------------------------------------------  Indications: Edema, and history of DVT, on Xarelto.  Limitations: Body habitus and poor ultrasound/tissue interface. Comparison Study: 01/20/2019- negative lower extremity venous duplex Performing Technologist: Maudry Mayhew MHA, RDMS, RVT, RDCS  Examination Guidelines: A complete evaluation includes B-mode imaging, spectral Doppler, color Doppler, and power Doppler as needed of all accessible portions of each vessel. Bilateral testing is considered an integral part of a complete examination. Limited examinations for reoccurring indications may be performed as noted. The reflux portion of the exam is performed with the patient in reverse Trendelenburg.  +---------+---------------+---------+-----------+----------+--------------+ RIGHT    CompressibilityPhasicitySpontaneityPropertiesThrombus Aging +---------+---------------+---------+-----------+----------+--------------+ CFV      Full           Yes      Yes                                 +---------+---------------+---------+-----------+----------+--------------+ FV Prox   Full                                                        +---------+---------------+---------+-----------+----------+--------------+ FV Mid   Full                                                        +---------+---------------+---------+-----------+----------+--------------+ FV DistalFull                                                        +---------+---------------+---------+-----------+----------+--------------+ PFV      Full                                                        +---------+---------------+---------+-----------+----------+--------------+ POP      Full           Yes      Yes                                 +---------+---------------+---------+-----------+----------+--------------+  PTV      Full                                                        +---------+---------------+---------+-----------+----------+--------------+ PERO     Full                                                        +---------+---------------+---------+-----------+----------+--------------+   Right Technical Findings: Not visualized segments include SFJ.  +---------+---------------+---------+-----------+----------+--------------+ LEFT     CompressibilityPhasicitySpontaneityPropertiesThrombus Aging +---------+---------------+---------+-----------+----------+--------------+ CFV      Full           Yes      Yes                                 +---------+---------------+---------+-----------+----------+--------------+ FV Prox  Full                                                        +---------+---------------+---------+-----------+----------+--------------+ FV Mid   Full                                                        +---------+---------------+---------+-----------+----------+--------------+ FV DistalFull                                                         +---------+---------------+---------+-----------+----------+--------------+ PFV      Full                                                        +---------+---------------+---------+-----------+----------+--------------+ POP      Full           Yes      Yes                                 +---------+---------------+---------+-----------+----------+--------------+ PTV      Full                                                        +---------+---------------+---------+-----------+----------+--------------+ PERO     Full                                                        +---------+---------------+---------+-----------+----------+--------------+  Left Technical Findings: Not visualized segments include SFJ.   Summary: RIGHT: - There is no evidence of deep vein thrombosis in the lower extremity. However, portions of this examination were limited- see technologist comments above.  - No cystic structure found in the popliteal fossa.  LEFT: - There is no evidence of deep vein thrombosis in the lower extremity. However, portions of this examination were limited- see technologist comments above.  - No cystic structure found in the popliteal fossa.  *See table(s) above for measurements and observations. Electronically signed by Monica Martinez MD on 09/18/2021 at 3:48:22 PM.    Final     Procedures Procedures   Medications Ordered in ED Medications  iohexol (OMNIPAQUE) 350 MG/ML injection 80 mL (80 mLs Intravenous Contrast Given 09/19/21 0313)    ED Course  I have reviewed the triage vital signs and the nursing notes.  Pertinent labs & imaging results that were available during my care of the patient were reviewed by me and considered in my medical decision making (see chart for details).    MDM Rules/Calculators/A&P                           70 yo F with a chief complaint of shortness of breath with exertion.  This been going on for some time now.  She is unable to tell  me a definitive amount of time but does not think its been years.  She has had a very mild cough but denies any fevers.  Gets sometimes a pressure across center of her chest that usually improves with albuterol transiently.  She saw her family doctor and reportedly was very short of breath at the time and told she needed to come here to be evaluated by CT scan for a pulmonary embolism as the patient had not been compliant with her Xarelto due to cost.  Her work-up so far has been largely unremarkable she has had 2 troponins that are negative electrolytes are unremarkable no significant anemia chest x-ray viewed by me without focal infiltrate or pneumothorax.  Patient with history of PE and exertional shortness of breath not compliant with her anticoagulants will obtain a CT angiogram.  CT scan is negative for pulmonary embolism.  We will discharge the patient home.  PCP follow-up.  3:29 AM:  I have discussed the diagnosis/risks/treatment options with the patient and believe the pt to be eligible for discharge home to follow-up with PCP. We also discussed returning to the ED immediately if new or worsening sx occur. We discussed the sx which are most concerning (e.g., sudden worsening pain, fever, inability to tolerate by mouth) that necessitate immediate return. Medications administered to the patient during their visit and any new prescriptions provided to the patient are listed below.  Medications given during this visit Medications  iohexol (OMNIPAQUE) 350 MG/ML injection 80 mL (80 mLs Intravenous Contrast Given 09/19/21 0313)     The patient appears reasonably screen and/or stabilized for discharge and I doubt any other medical condition or other [facility] requiring further screening, evaluation, or treatment in the ED at this time prior to discharge.   Final Clinical Impression(s) / ED Diagnoses Final diagnoses:  Nonspecific chest pain    Rx / DC Orders ED Discharge Orders     None         Deno Etienne, DO 09/19/21 0998

## 2021-09-19 NOTE — ED Notes (Signed)
Pt discharged and wheeled out of the ED in a wheel chair without difficulty. 

## 2021-09-25 DIAGNOSIS — Z86711 Personal history of pulmonary embolism: Secondary | ICD-10-CM | POA: Diagnosis not present

## 2021-09-25 DIAGNOSIS — G4709 Other insomnia: Secondary | ICD-10-CM | POA: Diagnosis not present

## 2021-09-25 DIAGNOSIS — I10 Essential (primary) hypertension: Secondary | ICD-10-CM | POA: Diagnosis not present

## 2021-09-25 DIAGNOSIS — R600 Localized edema: Secondary | ICD-10-CM | POA: Diagnosis not present

## 2021-09-25 DIAGNOSIS — E559 Vitamin D deficiency, unspecified: Secondary | ICD-10-CM | POA: Diagnosis not present

## 2021-09-25 DIAGNOSIS — M1990 Unspecified osteoarthritis, unspecified site: Secondary | ICD-10-CM | POA: Diagnosis not present

## 2021-10-25 DIAGNOSIS — J302 Other seasonal allergic rhinitis: Secondary | ICD-10-CM | POA: Diagnosis not present

## 2021-10-25 DIAGNOSIS — M1990 Unspecified osteoarthritis, unspecified site: Secondary | ICD-10-CM | POA: Diagnosis not present

## 2021-10-25 DIAGNOSIS — R600 Localized edema: Secondary | ICD-10-CM | POA: Diagnosis not present

## 2021-10-25 DIAGNOSIS — G4709 Other insomnia: Secondary | ICD-10-CM | POA: Diagnosis not present

## 2021-10-25 DIAGNOSIS — Z86711 Personal history of pulmonary embolism: Secondary | ICD-10-CM | POA: Diagnosis not present

## 2021-10-25 DIAGNOSIS — E559 Vitamin D deficiency, unspecified: Secondary | ICD-10-CM | POA: Diagnosis not present

## 2021-10-25 DIAGNOSIS — I10 Essential (primary) hypertension: Secondary | ICD-10-CM | POA: Diagnosis not present

## 2021-11-01 DIAGNOSIS — M79642 Pain in left hand: Secondary | ICD-10-CM | POA: Diagnosis not present

## 2021-11-01 DIAGNOSIS — G5622 Lesion of ulnar nerve, left upper limb: Secondary | ICD-10-CM | POA: Diagnosis not present

## 2021-11-01 DIAGNOSIS — G5602 Carpal tunnel syndrome, left upper limb: Secondary | ICD-10-CM | POA: Diagnosis not present

## 2021-11-01 DIAGNOSIS — G5620 Lesion of ulnar nerve, unspecified upper limb: Secondary | ICD-10-CM | POA: Insufficient documentation

## 2021-12-12 DIAGNOSIS — G4709 Other insomnia: Secondary | ICD-10-CM | POA: Diagnosis not present

## 2021-12-12 DIAGNOSIS — I10 Essential (primary) hypertension: Secondary | ICD-10-CM | POA: Diagnosis not present

## 2021-12-12 DIAGNOSIS — Z86711 Personal history of pulmonary embolism: Secondary | ICD-10-CM | POA: Diagnosis not present

## 2021-12-12 DIAGNOSIS — J4520 Mild intermittent asthma, uncomplicated: Secondary | ICD-10-CM | POA: Diagnosis not present

## 2021-12-12 DIAGNOSIS — J302 Other seasonal allergic rhinitis: Secondary | ICD-10-CM | POA: Diagnosis not present

## 2021-12-12 DIAGNOSIS — E559 Vitamin D deficiency, unspecified: Secondary | ICD-10-CM | POA: Diagnosis not present

## 2021-12-12 DIAGNOSIS — R600 Localized edema: Secondary | ICD-10-CM | POA: Diagnosis not present

## 2021-12-12 DIAGNOSIS — M1990 Unspecified osteoarthritis, unspecified site: Secondary | ICD-10-CM | POA: Diagnosis not present

## 2022-02-05 DIAGNOSIS — H0102B Squamous blepharitis left eye, upper and lower eyelids: Secondary | ICD-10-CM | POA: Diagnosis not present

## 2022-02-05 DIAGNOSIS — H1045 Other chronic allergic conjunctivitis: Secondary | ICD-10-CM | POA: Diagnosis not present

## 2022-02-05 DIAGNOSIS — Z961 Presence of intraocular lens: Secondary | ICD-10-CM | POA: Diagnosis not present

## 2022-02-05 DIAGNOSIS — H35373 Puckering of macula, bilateral: Secondary | ICD-10-CM | POA: Diagnosis not present

## 2022-02-05 DIAGNOSIS — H26491 Other secondary cataract, right eye: Secondary | ICD-10-CM | POA: Diagnosis not present

## 2022-02-05 DIAGNOSIS — H43812 Vitreous degeneration, left eye: Secondary | ICD-10-CM | POA: Diagnosis not present

## 2022-02-05 DIAGNOSIS — H16223 Keratoconjunctivitis sicca, not specified as Sjogren's, bilateral: Secondary | ICD-10-CM | POA: Diagnosis not present

## 2022-02-05 DIAGNOSIS — H0102A Squamous blepharitis right eye, upper and lower eyelids: Secondary | ICD-10-CM | POA: Diagnosis not present

## 2022-02-05 DIAGNOSIS — H401131 Primary open-angle glaucoma, bilateral, mild stage: Secondary | ICD-10-CM | POA: Diagnosis not present

## 2022-03-01 DIAGNOSIS — M1990 Unspecified osteoarthritis, unspecified site: Secondary | ICD-10-CM | POA: Diagnosis not present

## 2022-03-01 DIAGNOSIS — M25562 Pain in left knee: Secondary | ICD-10-CM | POA: Diagnosis not present

## 2022-03-01 DIAGNOSIS — M5432 Sciatica, left side: Secondary | ICD-10-CM | POA: Diagnosis not present

## 2022-03-13 DIAGNOSIS — Z131 Encounter for screening for diabetes mellitus: Secondary | ICD-10-CM | POA: Diagnosis not present

## 2022-03-13 DIAGNOSIS — D509 Iron deficiency anemia, unspecified: Secondary | ICD-10-CM | POA: Diagnosis not present

## 2022-03-13 DIAGNOSIS — M1990 Unspecified osteoarthritis, unspecified site: Secondary | ICD-10-CM | POA: Diagnosis not present

## 2022-03-13 DIAGNOSIS — Z13220 Encounter for screening for lipoid disorders: Secondary | ICD-10-CM | POA: Diagnosis not present

## 2022-03-13 DIAGNOSIS — J302 Other seasonal allergic rhinitis: Secondary | ICD-10-CM | POA: Diagnosis not present

## 2022-03-13 DIAGNOSIS — R600 Localized edema: Secondary | ICD-10-CM | POA: Diagnosis not present

## 2022-03-13 DIAGNOSIS — G4709 Other insomnia: Secondary | ICD-10-CM | POA: Diagnosis not present

## 2022-03-13 DIAGNOSIS — Z0000 Encounter for general adult medical examination without abnormal findings: Secondary | ICD-10-CM | POA: Diagnosis not present

## 2022-03-13 DIAGNOSIS — J4520 Mild intermittent asthma, uncomplicated: Secondary | ICD-10-CM | POA: Diagnosis not present

## 2022-03-13 DIAGNOSIS — E559 Vitamin D deficiency, unspecified: Secondary | ICD-10-CM | POA: Diagnosis not present

## 2022-03-13 DIAGNOSIS — Z86711 Personal history of pulmonary embolism: Secondary | ICD-10-CM | POA: Diagnosis not present

## 2022-03-13 DIAGNOSIS — E162 Hypoglycemia, unspecified: Secondary | ICD-10-CM | POA: Diagnosis not present

## 2022-03-13 DIAGNOSIS — I10 Essential (primary) hypertension: Secondary | ICD-10-CM | POA: Diagnosis not present

## 2022-03-14 DIAGNOSIS — G5602 Carpal tunnel syndrome, left upper limb: Secondary | ICD-10-CM | POA: Diagnosis not present

## 2022-03-14 DIAGNOSIS — M79642 Pain in left hand: Secondary | ICD-10-CM | POA: Diagnosis not present

## 2022-03-14 DIAGNOSIS — G5622 Lesion of ulnar nerve, left upper limb: Secondary | ICD-10-CM | POA: Diagnosis not present

## 2022-05-15 ENCOUNTER — Other Ambulatory Visit: Payer: Self-pay | Admitting: Endocrinology

## 2022-05-15 DIAGNOSIS — E010 Iodine-deficiency related diffuse (endemic) goiter: Secondary | ICD-10-CM

## 2022-05-31 DIAGNOSIS — E010 Iodine-deficiency related diffuse (endemic) goiter: Secondary | ICD-10-CM | POA: Diagnosis not present

## 2022-05-31 DIAGNOSIS — R7301 Impaired fasting glucose: Secondary | ICD-10-CM | POA: Diagnosis not present

## 2022-06-06 ENCOUNTER — Ambulatory Visit
Admission: RE | Admit: 2022-06-06 | Discharge: 2022-06-06 | Disposition: A | Discharge status: Home / Self Care | Payer: Medicare Other | Source: Ambulatory Visit | Attending: Endocrinology | Admitting: Endocrinology

## 2022-06-06 DIAGNOSIS — E010 Iodine-deficiency related diffuse (endemic) goiter: Secondary | ICD-10-CM

## 2022-06-06 DIAGNOSIS — E041 Nontoxic single thyroid nodule: Secondary | ICD-10-CM | POA: Diagnosis not present

## 2022-06-07 ENCOUNTER — Other Ambulatory Visit: Payer: Self-pay | Admitting: Endocrinology

## 2022-06-07 DIAGNOSIS — E010 Iodine-deficiency related diffuse (endemic) goiter: Secondary | ICD-10-CM | POA: Diagnosis not present

## 2022-06-07 DIAGNOSIS — R7301 Impaired fasting glucose: Secondary | ICD-10-CM

## 2022-06-07 DIAGNOSIS — I10 Essential (primary) hypertension: Secondary | ICD-10-CM | POA: Diagnosis not present

## 2022-06-08 ENCOUNTER — Other Ambulatory Visit: Payer: Self-pay

## 2022-06-08 ENCOUNTER — Emergency Department (HOSPITAL_COMMUNITY): Payer: Medicare Other

## 2022-06-08 ENCOUNTER — Encounter (HOSPITAL_COMMUNITY): Payer: Self-pay

## 2022-06-08 ENCOUNTER — Emergency Department (HOSPITAL_COMMUNITY)
Admission: EM | Admit: 2022-06-08 | Discharge: 2022-06-08 | Disposition: A | Discharge status: Home / Self Care | Payer: Medicare Other | Attending: Emergency Medicine | Admitting: Emergency Medicine

## 2022-06-08 DIAGNOSIS — E559 Vitamin D deficiency, unspecified: Secondary | ICD-10-CM | POA: Diagnosis not present

## 2022-06-08 DIAGNOSIS — G4709 Other insomnia: Secondary | ICD-10-CM | POA: Diagnosis not present

## 2022-06-08 DIAGNOSIS — R0789 Other chest pain: Secondary | ICD-10-CM | POA: Diagnosis not present

## 2022-06-08 DIAGNOSIS — Z79899 Other long term (current) drug therapy: Secondary | ICD-10-CM | POA: Diagnosis not present

## 2022-06-08 DIAGNOSIS — I10 Essential (primary) hypertension: Secondary | ICD-10-CM | POA: Insufficient documentation

## 2022-06-08 DIAGNOSIS — S2242XA Multiple fractures of ribs, left side, initial encounter for closed fracture: Secondary | ICD-10-CM | POA: Diagnosis not present

## 2022-06-08 DIAGNOSIS — R600 Localized edema: Secondary | ICD-10-CM | POA: Insufficient documentation

## 2022-06-08 DIAGNOSIS — R519 Headache, unspecified: Secondary | ICD-10-CM | POA: Diagnosis not present

## 2022-06-08 DIAGNOSIS — R0602 Shortness of breath: Secondary | ICD-10-CM | POA: Insufficient documentation

## 2022-06-08 DIAGNOSIS — Z7901 Long term (current) use of anticoagulants: Secondary | ICD-10-CM | POA: Diagnosis not present

## 2022-06-08 DIAGNOSIS — R42 Dizziness and giddiness: Secondary | ICD-10-CM | POA: Diagnosis not present

## 2022-06-08 DIAGNOSIS — Z86711 Personal history of pulmonary embolism: Secondary | ICD-10-CM | POA: Diagnosis not present

## 2022-06-08 DIAGNOSIS — M1990 Unspecified osteoarthritis, unspecified site: Secondary | ICD-10-CM | POA: Diagnosis not present

## 2022-06-08 DIAGNOSIS — J4520 Mild intermittent asthma, uncomplicated: Secondary | ICD-10-CM | POA: Diagnosis not present

## 2022-06-08 DIAGNOSIS — D649 Anemia, unspecified: Secondary | ICD-10-CM | POA: Diagnosis not present

## 2022-06-08 DIAGNOSIS — R079 Chest pain, unspecified: Secondary | ICD-10-CM

## 2022-06-08 DIAGNOSIS — J302 Other seasonal allergic rhinitis: Secondary | ICD-10-CM | POA: Diagnosis not present

## 2022-06-08 HISTORY — DX: Essential (primary) hypertension: I10

## 2022-06-08 LAB — BASIC METABOLIC PANEL
Anion gap: 9 (ref 5–15)
BUN: 10 mg/dL (ref 8–23)
CO2: 25 mmol/L (ref 22–32)
Calcium: 9 mg/dL (ref 8.9–10.3)
Chloride: 107 mmol/L (ref 98–111)
Creatinine, Ser: 0.87 mg/dL (ref 0.44–1.00)
GFR, Estimated: >60 mL/min (ref >60)
Glucose, Bld: 93 mg/dL (ref 70–99)
Potassium: 3.9 mmol/L (ref 3.5–5.1)
Sodium: 141 mmol/L (ref 135–145)

## 2022-06-08 LAB — CBC
HCT: 38.6 % (ref 36.0–46.0)
Hemoglobin: 11.8 g/dL — ABNORMAL LOW (ref 12.0–15.0)
MCH: 24.2 pg — ABNORMAL LOW (ref 26.0–34.0)
MCHC: 30.6 g/dL (ref 30.0–36.0)
MCV: 79.3 fL — ABNORMAL LOW (ref 80.0–100.0)
Platelets: 318 10*3/uL (ref 150–400)
RBC: 4.87 MIL/uL (ref 3.87–5.11)
RDW: 16.8 % — ABNORMAL HIGH (ref 11.5–15.5)
WBC: 4 10*3/uL (ref 4.0–10.5)
nRBC: 0 % (ref 0.0–0.2)

## 2022-06-08 LAB — TROPONIN I (HIGH SENSITIVITY)
Troponin I (High Sensitivity): 6 ng/L (ref <18)
Troponin I (High Sensitivity): 4 ng/L (ref <18)

## 2022-06-08 LAB — BRAIN NATRIURETIC PEPTIDE: B Natriuretic Peptide: 5.8 pg/mL (ref 0.0–100.0)

## 2022-06-08 MED ORDER — LACTATED RINGERS IV BOLUS
1000.0000 mL | Freq: Once | INTRAVENOUS | Status: AC
  Administered 2022-06-08: 1000 mL via INTRAVENOUS

## 2022-06-08 MED ORDER — KETOROLAC TROMETHAMINE 15 MG/ML IJ SOLN
15.0000 mg | Freq: Once | INTRAMUSCULAR | Status: AC
  Administered 2022-06-08: 15 mg via INTRAVENOUS
  Filled 2022-06-08: qty 1

## 2022-06-08 MED ORDER — PROCHLORPERAZINE EDISYLATE 10 MG/2ML IJ SOLN
10.0000 mg | Freq: Once | INTRAMUSCULAR | Status: AC
  Administered 2022-06-08: 10 mg via INTRAVENOUS
  Filled 2022-06-08: qty 2

## 2022-06-08 MED ORDER — IOHEXOL 350 MG/ML SOLN
100.0000 mL | Freq: Once | INTRAVENOUS | Status: AC | PRN
Start: 2022-06-08 — End: 2022-06-08
  Administered 2022-06-08: 100 mL via INTRAVENOUS

## 2022-06-08 NOTE — ED Triage Notes (Signed)
Pt sent to ED from MD office. Pt states she has had chest pain, shortness of breath and headache for past two weeks. Pt has had nausea, weakness and dizziness.

## 2022-06-08 NOTE — ED Notes (Signed)
RN reviewed discharge instructions with pt. Pt verbalized understanding and had no further questions. VSS upon discharge.  

## 2022-06-08 NOTE — Discharge Instructions (Addendum)
Work-up today was reassuring.  Blood work did not show any concerning findings.  CT scan of the chest did not show any evidence of blood clots.  You received IV fluids, and medication in the emergency room with resolution of your headache, and chest pain.  Have any worsening symptoms please return to the emergency room otherwise follow-up with your primary care provider.

## 2022-06-08 NOTE — ED Provider Triage Note (Signed)
Emergency Medicine Provider Triage Evaluation Note  Ariel Henry , a 71 y.o. female  was evaluated in triage.  Pt complains of chest pain and shortness of breath that has been worsening over the past two weeks. Pain in center of chest. Describes it as pressure. She also has new distended veins in her right chest. Also talks about intermittent headaches and associated dizziness at times.  Seems like this has been going on for a while. Apparently her doctor sent her here.   Patient is poor historian.  Review of Systems  Positive: Chest pain, shortness of breath, headaches, dizziness Negative:   Physical Exam  BP (!) 142/76 (BP Location: Left Arm)   Pulse 74   Temp 99 F (37.2 C) (Oral)   Resp (!) 22   SpO2 97%  Gen:   Awake, no distress   Resp:  Normal effort  MSK:   Moves extremities without difficulty  Other:  Heart RRR, no murmurs. Lungs CTA  Medical Decision Making  Medically screening exam initiated at 11:08 AM.  Appropriate orders placed.  Ariel Henry was informed that the remainder of the evaluation will be completed by another provider, this initial triage assessment does not replace that evaluation, and the importance of remaining in the ED until their evaluation is complete.     Adolphus Birchwood, PA-C 06/08/22 1110

## 2022-06-08 NOTE — ED Provider Notes (Signed)
[facility] EMERGENCY DEPARTMENT Provider Note   CSN: [id number] Arrival date & time: 06/08/22  1054     History  Chief Complaint  Patient presents with   Shortness of Breath    Erlinda Rehmat Murtagh is a 71 y.o. female.  71 year old female with history of DVT, PE, hypertension presents today for evaluation of shortness of breath, chest pressure, headache, lightheadedness with positional change.  She was evaluated at PCP office and referred to the emergency room for further evaluation.  States symptoms have been ongoing for at least a couple weeks.  Reports compliance with her Xarelto.  She also takes hydrochlorothiazide which she reports compliance with.  Does have bilateral lower extremity edema.  States that its improved from what it typically is.  Pain is not pleuritic.  Denies recent travel, surgery.  Does have occasional cough with clear sputum.  Denies hemoptysis.  Denies history of CHF.  Denies vision change, or neck pain.  Endorses decreased fluid intake.  States she does drink quite a bit of caffeine  The history is provided by the patient. No language interpreter was used.       Home Medications Prior to Admission medications   Medication Sig Start Date End Date Taking? Authorizing Provider  albuterol (PROVENTIL HFA;VENTOLIN HFA) 108 (90 BASE) MCG/ACT inhaler Inhale 1-2 puffs into the lungs every 6 (six) hours as needed for wheezing. 07/12/13   Fredia Sorrow, MD  cetirizine (ZYRTEC) 10 MG tablet Take 1 tablet (10 mg total) by mouth daily. 05/10/18   Zigmund Gottron, NP  cholecalciferol (VITAMIN D) 1000 units tablet Take 1,000 Units by mouth daily.    [provider]  dorzolamide (TRUSOPT) 2 % ophthalmic solution 1 drop 3 (three) times daily.    [provider]  gabapentin (NEURONTIN) 300 MG capsule Take 300 mg by mouth at bedtime.    [provider]  gabapentin (NEURONTIN) 300 MG capsule Take 300 mg by mouth 2 (two) times  daily. '600mg'$  in morning and afternoon, '300mg'$  at bedtime     [provider]  hydrochlorothiazide (HYDRODIURIL) 25 MG tablet Take 25 mg by mouth daily.    [provider]  hydrOXYzine (ATARAX/VISTARIL) 25 MG tablet Take 25 mg by mouth 4 (four) times daily as needed for anxiety.    [provider]  ipratropium (ATROVENT) 0.06 % nasal spray Place 2 sprays into both nostrils 3 (three) times daily. 05/10/18   Zigmund Gottron, NP  latanoprost (XALATAN) 0.005 % ophthalmic solution Place 1 drop into both eyes at bedtime.    [provider]  Polyethyl Glycol-Propyl Glycol (SYSTANE ULTRA OP) Apply to eye.    [provider]  predniSONE (DELTASONE) 5 MG tablet Take 1 tablet (5 mg total) by mouth 2 (two) times daily with a meal. 12/23/17   Robyn Haber, MD  pyridOXINE (VITAMIN B-6) 100 MG tablet Take 100 mg by mouth daily.    [provider]  Rivaroxaban 15 & 20 MG TBPK Take as directed on package: Start with one '15mg'$  tablet by mouth twice a day with food. On Day 22, switch to one '20mg'$  tablet once a day with food. 12/22/16   Mikhail, Velta Addison, DO  timolol (TIMOPTIC) 0.5 % ophthalmic solution 1 drop 2 (two) times daily.    [provider]  Timolol Maleate PF 0.5 % SOLN Apply 1 drop to eye every morning.    [provider]      Allergies    Patient has no  known allergies.    Review of Systems   Review of Systems  Constitutional:  Negative for chills and fever.  Eyes:  Negative for visual disturbance.  Respiratory:  Positive for cough and shortness of breath.   Cardiovascular:  Positive for chest pain.  Gastrointestinal:  Negative for abdominal pain, nausea and vomiting.  Neurological:  Positive for light-headedness and headaches. Negative for syncope and weakness.  All other systems reviewed and are negative.   Physical Exam Updated Vital Signs BP 126/73 (BP Location: Left Arm)   Pulse 74   Temp 98 F (36.7 C) (Oral)   Resp  17   Ht '4\' 10"'$  (1.473 m)   Wt 124.7 kg   SpO2 100%   BMI 57.48 kg/m  Physical Exam Vitals and nursing note reviewed.  Constitutional:      General: She is not in acute distress.    Appearance: Normal appearance. She is well-developed. She is obese. She is not ill-appearing.  HENT:     Head: Normocephalic and atraumatic.     Nose: Nose normal.  Eyes:     General: No scleral icterus.    Extraocular Movements: Extraocular movements intact.     Conjunctiva/sclera: Conjunctivae normal.  Cardiovascular:     Rate and Rhythm: Normal rate and regular rhythm.     Pulses: Normal pulses.  Pulmonary:     Effort: Pulmonary effort is normal. No respiratory distress.     Breath sounds: Normal breath sounds. No wheezing or rales.  Abdominal:     General: There is no distension.     Palpations: Abdomen is soft.     Tenderness: There is no abdominal tenderness.  Musculoskeletal:        General: Normal range of motion.     Cervical back: Normal range of motion.     Right lower leg: Edema (Trace pitting edema) present.     Left lower leg: Edema (Trace pitting edema) present.  Skin:    General: Skin is warm and dry.  Neurological:     General: No focal deficit present.     Mental Status: She is alert. Mental status is at baseline.     ED Results / Procedures / Treatments   Labs (all labs ordered are listed, but only abnormal results are displayed) Labs Reviewed  CBC - Abnormal; Notable for the following components:      Result Value   Hemoglobin 11.8 (*)    MCV 79.3 (*)    MCH 24.2 (*)    RDW 16.8 (*)    All other components within normal limits  BASIC METABOLIC PANEL  BRAIN NATRIURETIC PEPTIDE  TROPONIN I (HIGH SENSITIVITY)  TROPONIN I (HIGH SENSITIVITY)    EKG None  Radiology DG Chest 1 View  Result Date: 06/08/2022 CLINICAL DATA:  Shortness of breath. EXAM: CHEST  1 VIEW COMPARISON:  Chest two views 09/18/2021; CT chest 09/19/2021 FINDINGS: Cardiac silhouette is again  mildly enlarged. Moderate calcification within the aortic arch. Mildly tortuous descending thoracic aorta. Mildly decreased lung volumes. Mild-to-moderate elevation of the right hemidiaphragm, unchanged. No significant change in small calcified benign phleboliths overlying the left upper lung compared to 09/18/2021 radiographs. This is seen as a benign calcified granuloma within the posterior left upper lobe on 09/19/2021 CT. Soft tissue prominence to the right of the mid to inferior trachea corresponding to the thyroid goiter seen on prior CT. No focal airspace opacity to indicate pneumonia. No pleural effusion or pneumothorax. Moderate multilevel degenerative disc changes of the  thoracic spine. IMPRESSION: 1. No acute cardiopulmonary disease process. 2. Redemonstration of thyroid goiter. Electronically Signed   By: Yvonne Kendall M.D.   On: 06/08/2022 11:55    Procedures Procedures    Medications Ordered in ED Medications - No data to display  ED Course/ Medical Decision Making/ A&P                           Medical Decision Making Amount and/or Complexity of Data Reviewed Radiology: ordered.  Risk Prescription drug management.   Medical Decision Making / ED Course   This patient presents to the ED for concern of chest pain, shortness of breath, headache, lightheadedness, this involves an extensive number of treatment options, and is a complaint that carries with it a high risk of complications and morbidity.  The differential diagnosis includes ACS, PE, pneumonia, MSK pain, dehydration, migraine  MDM: 71 year old female presents with above-mentioned symptoms.  Has history of DVT, PE.  Compliant with Xarelto.  Referred from PCP office.  Work-up so far reassuring.  CBC with mild anemia but not significantly decreased from baseline.  CMP with preserved renal function.  Troponin negative x2.  BNP within normal limits.  Will evaluate further with CTA chest.  Will provide IV fluids, migraine  cocktail. CTA chest without evidence of PE or other acute findings.  On reevaluation patient is without headache, or chest pain.  She is able to ambulate following IV fluids without difficulty.  Reports she feels at her baseline.  Given above work-up patient is appropriate for discharge.  Discharged in stable condition.  Return precautions discussed.  Discussed follow-up with PCP.  Lab Tests: -I ordered, reviewed, and interpreted labs.   The pertinent results include:   Labs Reviewed  CBC - Abnormal; Notable for the following components:      Result Value   Hemoglobin 11.8 (*)    MCV 79.3 (*)    MCH 24.2 (*)    RDW 16.8 (*)    All other components within normal limits  BASIC METABOLIC PANEL  BRAIN NATRIURETIC PEPTIDE  TROPONIN I (HIGH SENSITIVITY)  TROPONIN I (HIGH SENSITIVITY)      EKG  EKG Interpretation  Date/Time:    Ventricular Rate:    PR Interval:    QRS Duration:   QT Interval:    QTC Calculation:   R Axis:     Text Interpretation:           Imaging Studies ordered: I ordered imaging studies including chest x-ray, CTA chest PE study I independently visualized and interpreted imaging. I agree with the radiologist interpretation   Medicines ordered and prescription drug management: Meds ordered this encounter  Medications   lactated ringers bolus 1,000 mL   prochlorperazine (COMPAZINE) injection 10 mg   ketorolac (TORADOL) 15 MG/ML injection 15 mg    -I have reviewed the patients home medicines and have made adjustments as needed  Cardiac Monitoring: The patient was maintained on a cardiac monitor.  I personally viewed and interpreted the cardiac monitored which showed an underlying rhythm of: Normal sinus rhythm  Reevaluation: After the interventions noted above, I reevaluated the patient and found that they have :resolved  Co morbidities that complicate the patient evaluation  Past Medical History:  Diagnosis Date   Arthritis    DVT (deep venous  thrombosis) (HCC)    Glaucoma    Hypertension    Neuromuscular disorder (HCC)    carpal tunnel both wrists  Obesity    Pneumonia    Thyroid disease       Dispostion: Patient is appropriate for discharge.  Discharged in stable condition.  Return precautions discussed.   Final Clinical Impression(s) / ED Diagnoses Final diagnoses:  Chest pain, unspecified type    Rx / DC Orders ED Discharge Orders     None         Evlyn Courier, PA-C 06/08/22 2030    Luna Fuse, MD 06/09/22 2332

## 2022-06-11 DIAGNOSIS — H0102A Squamous blepharitis right eye, upper and lower eyelids: Secondary | ICD-10-CM | POA: Diagnosis not present

## 2022-06-11 DIAGNOSIS — H1045 Other chronic allergic conjunctivitis: Secondary | ICD-10-CM | POA: Diagnosis not present

## 2022-06-11 DIAGNOSIS — H43812 Vitreous degeneration, left eye: Secondary | ICD-10-CM | POA: Diagnosis not present

## 2022-06-11 DIAGNOSIS — H26491 Other secondary cataract, right eye: Secondary | ICD-10-CM | POA: Diagnosis not present

## 2022-06-11 DIAGNOSIS — H401131 Primary open-angle glaucoma, bilateral, mild stage: Secondary | ICD-10-CM | POA: Diagnosis not present

## 2022-06-11 DIAGNOSIS — H35373 Puckering of macula, bilateral: Secondary | ICD-10-CM | POA: Diagnosis not present

## 2022-06-11 DIAGNOSIS — Z961 Presence of intraocular lens: Secondary | ICD-10-CM | POA: Diagnosis not present

## 2022-06-11 DIAGNOSIS — H0102B Squamous blepharitis left eye, upper and lower eyelids: Secondary | ICD-10-CM | POA: Diagnosis not present

## 2022-06-19 ENCOUNTER — Other Ambulatory Visit: Payer: Self-pay | Admitting: Physician Assistant

## 2022-06-19 DIAGNOSIS — Z1231 Encounter for screening mammogram for malignant neoplasm of breast: Secondary | ICD-10-CM

## 2022-07-03 ENCOUNTER — Ambulatory Visit
Admission: RE | Admit: 2022-07-03 | Discharge: 2022-07-03 | Disposition: A | Discharge status: Home / Self Care | Payer: Medicare Other | Source: Ambulatory Visit | Attending: Physician Assistant | Admitting: Physician Assistant

## 2022-07-03 DIAGNOSIS — Z1231 Encounter for screening mammogram for malignant neoplasm of breast: Secondary | ICD-10-CM

## 2022-07-12 DIAGNOSIS — M1990 Unspecified osteoarthritis, unspecified site: Secondary | ICD-10-CM | POA: Diagnosis not present

## 2022-07-12 DIAGNOSIS — Z0001 Encounter for general adult medical examination with abnormal findings: Secondary | ICD-10-CM | POA: Diagnosis not present

## 2022-07-12 DIAGNOSIS — Z86711 Personal history of pulmonary embolism: Secondary | ICD-10-CM | POA: Diagnosis not present

## 2022-07-12 DIAGNOSIS — J4520 Mild intermittent asthma, uncomplicated: Secondary | ICD-10-CM | POA: Diagnosis not present

## 2022-07-12 DIAGNOSIS — E559 Vitamin D deficiency, unspecified: Secondary | ICD-10-CM | POA: Diagnosis not present

## 2022-07-12 DIAGNOSIS — J302 Other seasonal allergic rhinitis: Secondary | ICD-10-CM | POA: Diagnosis not present

## 2022-07-12 DIAGNOSIS — I10 Essential (primary) hypertension: Secondary | ICD-10-CM | POA: Diagnosis not present

## 2022-07-12 DIAGNOSIS — G4709 Other insomnia: Secondary | ICD-10-CM | POA: Diagnosis not present

## 2022-07-12 DIAGNOSIS — R600 Localized edema: Secondary | ICD-10-CM | POA: Diagnosis not present

## 2022-09-14 DIAGNOSIS — J0100 Acute maxillary sinusitis, unspecified: Secondary | ICD-10-CM | POA: Diagnosis not present

## 2022-09-20 DIAGNOSIS — R600 Localized edema: Secondary | ICD-10-CM | POA: Diagnosis not present

## 2022-09-20 DIAGNOSIS — J4520 Mild intermittent asthma, uncomplicated: Secondary | ICD-10-CM | POA: Diagnosis not present

## 2022-09-20 DIAGNOSIS — J302 Other seasonal allergic rhinitis: Secondary | ICD-10-CM | POA: Diagnosis not present

## 2022-09-20 DIAGNOSIS — G4709 Other insomnia: Secondary | ICD-10-CM | POA: Diagnosis not present

## 2022-09-20 DIAGNOSIS — M1990 Unspecified osteoarthritis, unspecified site: Secondary | ICD-10-CM | POA: Diagnosis not present

## 2022-09-20 DIAGNOSIS — E559 Vitamin D deficiency, unspecified: Secondary | ICD-10-CM | POA: Diagnosis not present

## 2022-09-20 DIAGNOSIS — I10 Essential (primary) hypertension: Secondary | ICD-10-CM | POA: Diagnosis not present

## 2022-09-20 DIAGNOSIS — Z86711 Personal history of pulmonary embolism: Secondary | ICD-10-CM | POA: Diagnosis not present

## 2022-10-04 DIAGNOSIS — J0110 Acute frontal sinusitis, unspecified: Secondary | ICD-10-CM | POA: Diagnosis not present

## 2022-10-09 DIAGNOSIS — H0102B Squamous blepharitis left eye, upper and lower eyelids: Secondary | ICD-10-CM | POA: Diagnosis not present

## 2022-10-09 DIAGNOSIS — Z961 Presence of intraocular lens: Secondary | ICD-10-CM | POA: Diagnosis not present

## 2022-10-09 DIAGNOSIS — H1045 Other chronic allergic conjunctivitis: Secondary | ICD-10-CM | POA: Diagnosis not present

## 2022-10-09 DIAGNOSIS — H0102A Squamous blepharitis right eye, upper and lower eyelids: Secondary | ICD-10-CM | POA: Diagnosis not present

## 2022-11-16 DIAGNOSIS — R7303 Prediabetes: Secondary | ICD-10-CM | POA: Diagnosis not present

## 2022-11-16 DIAGNOSIS — G4709 Other insomnia: Secondary | ICD-10-CM | POA: Diagnosis not present

## 2022-11-16 DIAGNOSIS — J4520 Mild intermittent asthma, uncomplicated: Secondary | ICD-10-CM | POA: Diagnosis not present

## 2022-11-16 DIAGNOSIS — I10 Essential (primary) hypertension: Secondary | ICD-10-CM | POA: Diagnosis not present

## 2022-11-16 DIAGNOSIS — E559 Vitamin D deficiency, unspecified: Secondary | ICD-10-CM | POA: Diagnosis not present

## 2022-11-16 DIAGNOSIS — Z86711 Personal history of pulmonary embolism: Secondary | ICD-10-CM | POA: Diagnosis not present

## 2022-11-16 DIAGNOSIS — M1990 Unspecified osteoarthritis, unspecified site: Secondary | ICD-10-CM | POA: Diagnosis not present

## 2022-11-16 DIAGNOSIS — J302 Other seasonal allergic rhinitis: Secondary | ICD-10-CM | POA: Diagnosis not present

## 2022-11-16 DIAGNOSIS — R600 Localized edema: Secondary | ICD-10-CM | POA: Diagnosis not present

## 2022-12-19 DIAGNOSIS — Z86711 Personal history of pulmonary embolism: Secondary | ICD-10-CM | POA: Diagnosis not present

## 2022-12-19 DIAGNOSIS — E559 Vitamin D deficiency, unspecified: Secondary | ICD-10-CM | POA: Diagnosis not present

## 2022-12-19 DIAGNOSIS — R600 Localized edema: Secondary | ICD-10-CM | POA: Diagnosis not present

## 2022-12-19 DIAGNOSIS — J4520 Mild intermittent asthma, uncomplicated: Secondary | ICD-10-CM | POA: Diagnosis not present

## 2022-12-19 DIAGNOSIS — G4709 Other insomnia: Secondary | ICD-10-CM | POA: Diagnosis not present

## 2022-12-19 DIAGNOSIS — I10 Essential (primary) hypertension: Secondary | ICD-10-CM | POA: Diagnosis not present

## 2022-12-19 DIAGNOSIS — J302 Other seasonal allergic rhinitis: Secondary | ICD-10-CM | POA: Diagnosis not present

## 2022-12-19 DIAGNOSIS — M1990 Unspecified osteoarthritis, unspecified site: Secondary | ICD-10-CM | POA: Diagnosis not present

## 2023-01-30 DIAGNOSIS — J302 Other seasonal allergic rhinitis: Secondary | ICD-10-CM | POA: Diagnosis not present

## 2023-01-30 DIAGNOSIS — J4520 Mild intermittent asthma, uncomplicated: Secondary | ICD-10-CM | POA: Diagnosis not present

## 2023-01-30 DIAGNOSIS — E559 Vitamin D deficiency, unspecified: Secondary | ICD-10-CM | POA: Diagnosis not present

## 2023-01-30 DIAGNOSIS — G4709 Other insomnia: Secondary | ICD-10-CM | POA: Diagnosis not present

## 2023-01-30 DIAGNOSIS — I10 Essential (primary) hypertension: Secondary | ICD-10-CM | POA: Diagnosis not present

## 2023-01-30 DIAGNOSIS — R600 Localized edema: Secondary | ICD-10-CM | POA: Diagnosis not present

## 2023-01-30 DIAGNOSIS — M1990 Unspecified osteoarthritis, unspecified site: Secondary | ICD-10-CM | POA: Diagnosis not present

## 2023-01-30 DIAGNOSIS — Z86711 Personal history of pulmonary embolism: Secondary | ICD-10-CM | POA: Diagnosis not present

## 2023-02-11 DIAGNOSIS — H0102B Squamous blepharitis left eye, upper and lower eyelids: Secondary | ICD-10-CM | POA: Diagnosis not present

## 2023-02-11 DIAGNOSIS — H0102A Squamous blepharitis right eye, upper and lower eyelids: Secondary | ICD-10-CM | POA: Diagnosis not present

## 2023-02-11 DIAGNOSIS — H401131 Primary open-angle glaucoma, bilateral, mild stage: Secondary | ICD-10-CM | POA: Diagnosis not present

## 2023-02-11 DIAGNOSIS — H35373 Puckering of macula, bilateral: Secondary | ICD-10-CM | POA: Diagnosis not present

## 2023-02-11 DIAGNOSIS — H26491 Other secondary cataract, right eye: Secondary | ICD-10-CM | POA: Diagnosis not present

## 2023-02-11 DIAGNOSIS — Z961 Presence of intraocular lens: Secondary | ICD-10-CM | POA: Diagnosis not present

## 2023-02-11 DIAGNOSIS — H43812 Vitreous degeneration, left eye: Secondary | ICD-10-CM | POA: Diagnosis not present

## 2023-03-18 DIAGNOSIS — H26491 Other secondary cataract, right eye: Secondary | ICD-10-CM | POA: Diagnosis not present

## 2023-05-02 DIAGNOSIS — E559 Vitamin D deficiency, unspecified: Secondary | ICD-10-CM | POA: Diagnosis not present

## 2023-05-02 DIAGNOSIS — R600 Localized edema: Secondary | ICD-10-CM | POA: Diagnosis not present

## 2023-05-02 DIAGNOSIS — I10 Essential (primary) hypertension: Secondary | ICD-10-CM | POA: Diagnosis not present

## 2023-05-02 DIAGNOSIS — M1990 Unspecified osteoarthritis, unspecified site: Secondary | ICD-10-CM | POA: Diagnosis not present

## 2023-05-02 DIAGNOSIS — J4520 Mild intermittent asthma, uncomplicated: Secondary | ICD-10-CM | POA: Diagnosis not present

## 2023-05-02 DIAGNOSIS — G4709 Other insomnia: Secondary | ICD-10-CM | POA: Diagnosis not present

## 2023-05-02 DIAGNOSIS — J302 Other seasonal allergic rhinitis: Secondary | ICD-10-CM | POA: Diagnosis not present

## 2023-05-02 DIAGNOSIS — Z86711 Personal history of pulmonary embolism: Secondary | ICD-10-CM | POA: Diagnosis not present

## 2023-06-05 ENCOUNTER — Other Ambulatory Visit: Payer: Self-pay | Admitting: Endocrinology

## 2023-06-05 DIAGNOSIS — R7301 Impaired fasting glucose: Secondary | ICD-10-CM

## 2023-06-10 DIAGNOSIS — R7301 Impaired fasting glucose: Secondary | ICD-10-CM | POA: Diagnosis not present

## 2023-06-10 DIAGNOSIS — I10 Essential (primary) hypertension: Secondary | ICD-10-CM | POA: Diagnosis not present

## 2023-06-10 DIAGNOSIS — E010 Iodine-deficiency related diffuse (endemic) goiter: Secondary | ICD-10-CM | POA: Diagnosis not present

## 2023-06-10 DIAGNOSIS — E669 Obesity, unspecified: Secondary | ICD-10-CM | POA: Diagnosis not present

## 2023-06-18 ENCOUNTER — Other Ambulatory Visit: Payer: Self-pay | Admitting: Physician Assistant

## 2023-06-18 DIAGNOSIS — Z1231 Encounter for screening mammogram for malignant neoplasm of breast: Secondary | ICD-10-CM

## 2023-07-01 DIAGNOSIS — H35373 Puckering of macula, bilateral: Secondary | ICD-10-CM | POA: Diagnosis not present

## 2023-07-01 DIAGNOSIS — H1045 Other chronic allergic conjunctivitis: Secondary | ICD-10-CM | POA: Diagnosis not present

## 2023-07-01 DIAGNOSIS — H401131 Primary open-angle glaucoma, bilateral, mild stage: Secondary | ICD-10-CM | POA: Diagnosis not present

## 2023-07-01 DIAGNOSIS — H0102B Squamous blepharitis left eye, upper and lower eyelids: Secondary | ICD-10-CM | POA: Diagnosis not present

## 2023-07-01 DIAGNOSIS — H43812 Vitreous degeneration, left eye: Secondary | ICD-10-CM | POA: Diagnosis not present

## 2023-07-01 DIAGNOSIS — Z961 Presence of intraocular lens: Secondary | ICD-10-CM | POA: Diagnosis not present

## 2023-07-01 DIAGNOSIS — H0102A Squamous blepharitis right eye, upper and lower eyelids: Secondary | ICD-10-CM | POA: Diagnosis not present

## 2023-07-08 ENCOUNTER — Ambulatory Visit
Admission: RE | Admit: 2023-07-08 | Discharge: 2023-07-08 | Disposition: A | Discharge status: Home / Self Care | Payer: No Typology Code available for payment source | Source: Ambulatory Visit | Attending: Physician Assistant | Admitting: Physician Assistant

## 2023-07-08 DIAGNOSIS — Z1231 Encounter for screening mammogram for malignant neoplasm of breast: Secondary | ICD-10-CM

## 2023-09-23 DIAGNOSIS — E559 Vitamin D deficiency, unspecified: Secondary | ICD-10-CM | POA: Diagnosis not present

## 2023-09-23 DIAGNOSIS — G4709 Other insomnia: Secondary | ICD-10-CM | POA: Diagnosis not present

## 2023-09-23 DIAGNOSIS — Z86711 Personal history of pulmonary embolism: Secondary | ICD-10-CM | POA: Diagnosis not present

## 2023-09-23 DIAGNOSIS — I10 Essential (primary) hypertension: Secondary | ICD-10-CM | POA: Diagnosis not present

## 2023-09-23 DIAGNOSIS — R7303 Prediabetes: Secondary | ICD-10-CM | POA: Diagnosis not present

## 2023-09-23 DIAGNOSIS — J302 Other seasonal allergic rhinitis: Secondary | ICD-10-CM | POA: Diagnosis not present

## 2023-09-23 DIAGNOSIS — R600 Localized edema: Secondary | ICD-10-CM | POA: Diagnosis not present

## 2023-09-23 DIAGNOSIS — M1990 Unspecified osteoarthritis, unspecified site: Secondary | ICD-10-CM | POA: Diagnosis not present

## 2023-09-23 DIAGNOSIS — Z0000 Encounter for general adult medical examination without abnormal findings: Secondary | ICD-10-CM | POA: Diagnosis not present

## 2023-09-23 DIAGNOSIS — J4520 Mild intermittent asthma, uncomplicated: Secondary | ICD-10-CM | POA: Diagnosis not present

## 2023-10-07 DIAGNOSIS — I10 Essential (primary) hypertension: Secondary | ICD-10-CM | POA: Diagnosis not present

## 2023-10-07 DIAGNOSIS — J4520 Mild intermittent asthma, uncomplicated: Secondary | ICD-10-CM | POA: Diagnosis not present

## 2023-10-07 DIAGNOSIS — M1990 Unspecified osteoarthritis, unspecified site: Secondary | ICD-10-CM | POA: Diagnosis not present

## 2023-10-07 DIAGNOSIS — Z86711 Personal history of pulmonary embolism: Secondary | ICD-10-CM | POA: Diagnosis not present

## 2023-10-07 DIAGNOSIS — E559 Vitamin D deficiency, unspecified: Secondary | ICD-10-CM | POA: Diagnosis not present

## 2023-10-07 DIAGNOSIS — R7303 Prediabetes: Secondary | ICD-10-CM | POA: Diagnosis not present

## 2023-10-07 DIAGNOSIS — R600 Localized edema: Secondary | ICD-10-CM | POA: Diagnosis not present

## 2023-12-02 DIAGNOSIS — H0102A Squamous blepharitis right eye, upper and lower eyelids: Secondary | ICD-10-CM | POA: Diagnosis not present

## 2023-12-02 DIAGNOSIS — H35373 Puckering of macula, bilateral: Secondary | ICD-10-CM | POA: Diagnosis not present

## 2023-12-02 DIAGNOSIS — Z961 Presence of intraocular lens: Secondary | ICD-10-CM | POA: Diagnosis not present

## 2023-12-02 DIAGNOSIS — H401131 Primary open-angle glaucoma, bilateral, mild stage: Secondary | ICD-10-CM | POA: Diagnosis not present

## 2023-12-02 DIAGNOSIS — H1045 Other chronic allergic conjunctivitis: Secondary | ICD-10-CM | POA: Diagnosis not present

## 2023-12-02 DIAGNOSIS — H0102B Squamous blepharitis left eye, upper and lower eyelids: Secondary | ICD-10-CM | POA: Diagnosis not present

## 2023-12-02 DIAGNOSIS — H43812 Vitreous degeneration, left eye: Secondary | ICD-10-CM | POA: Diagnosis not present

## 2023-12-09 DIAGNOSIS — H0102A Squamous blepharitis right eye, upper and lower eyelids: Secondary | ICD-10-CM | POA: Diagnosis not present

## 2023-12-09 DIAGNOSIS — H0102B Squamous blepharitis left eye, upper and lower eyelids: Secondary | ICD-10-CM | POA: Diagnosis not present

## 2023-12-09 DIAGNOSIS — H43812 Vitreous degeneration, left eye: Secondary | ICD-10-CM | POA: Diagnosis not present

## 2023-12-09 DIAGNOSIS — H35373 Puckering of macula, bilateral: Secondary | ICD-10-CM | POA: Diagnosis not present

## 2023-12-09 DIAGNOSIS — H401131 Primary open-angle glaucoma, bilateral, mild stage: Secondary | ICD-10-CM | POA: Diagnosis not present

## 2023-12-09 DIAGNOSIS — Z961 Presence of intraocular lens: Secondary | ICD-10-CM | POA: Diagnosis not present

## 2023-12-09 DIAGNOSIS — H1045 Other chronic allergic conjunctivitis: Secondary | ICD-10-CM | POA: Diagnosis not present

## 2024-01-06 DIAGNOSIS — Z86711 Personal history of pulmonary embolism: Secondary | ICD-10-CM | POA: Diagnosis not present

## 2024-01-06 DIAGNOSIS — I10 Essential (primary) hypertension: Secondary | ICD-10-CM | POA: Diagnosis not present

## 2024-01-06 DIAGNOSIS — E559 Vitamin D deficiency, unspecified: Secondary | ICD-10-CM | POA: Diagnosis not present

## 2024-01-06 DIAGNOSIS — J4520 Mild intermittent asthma, uncomplicated: Secondary | ICD-10-CM | POA: Diagnosis not present

## 2024-01-06 DIAGNOSIS — R600 Localized edema: Secondary | ICD-10-CM | POA: Diagnosis not present

## 2024-01-06 DIAGNOSIS — D509 Iron deficiency anemia, unspecified: Secondary | ICD-10-CM | POA: Diagnosis not present

## 2024-01-06 DIAGNOSIS — M1990 Unspecified osteoarthritis, unspecified site: Secondary | ICD-10-CM | POA: Diagnosis not present

## 2024-01-06 DIAGNOSIS — R7303 Prediabetes: Secondary | ICD-10-CM | POA: Diagnosis not present

## 2024-01-20 DIAGNOSIS — D509 Iron deficiency anemia, unspecified: Secondary | ICD-10-CM | POA: Diagnosis not present

## 2024-01-20 DIAGNOSIS — Z86711 Personal history of pulmonary embolism: Secondary | ICD-10-CM | POA: Diagnosis not present

## 2024-01-20 DIAGNOSIS — E559 Vitamin D deficiency, unspecified: Secondary | ICD-10-CM | POA: Diagnosis not present

## 2024-01-20 DIAGNOSIS — I10 Essential (primary) hypertension: Secondary | ICD-10-CM | POA: Diagnosis not present

## 2024-01-20 DIAGNOSIS — M1990 Unspecified osteoarthritis, unspecified site: Secondary | ICD-10-CM | POA: Diagnosis not present

## 2024-01-20 DIAGNOSIS — J4520 Mild intermittent asthma, uncomplicated: Secondary | ICD-10-CM | POA: Diagnosis not present

## 2024-01-20 DIAGNOSIS — R7303 Prediabetes: Secondary | ICD-10-CM | POA: Diagnosis not present

## 2024-01-30 DIAGNOSIS — Z86711 Personal history of pulmonary embolism: Secondary | ICD-10-CM | POA: Diagnosis not present

## 2024-01-30 DIAGNOSIS — J309 Allergic rhinitis, unspecified: Secondary | ICD-10-CM | POA: Diagnosis not present

## 2024-01-30 DIAGNOSIS — J4520 Mild intermittent asthma, uncomplicated: Secondary | ICD-10-CM | POA: Diagnosis not present

## 2024-01-30 DIAGNOSIS — E559 Vitamin D deficiency, unspecified: Secondary | ICD-10-CM | POA: Diagnosis not present

## 2024-01-30 DIAGNOSIS — I10 Essential (primary) hypertension: Secondary | ICD-10-CM | POA: Diagnosis not present

## 2024-01-30 DIAGNOSIS — D509 Iron deficiency anemia, unspecified: Secondary | ICD-10-CM | POA: Diagnosis not present

## 2024-01-30 DIAGNOSIS — R7303 Prediabetes: Secondary | ICD-10-CM | POA: Diagnosis not present

## 2024-01-30 DIAGNOSIS — M1990 Unspecified osteoarthritis, unspecified site: Secondary | ICD-10-CM | POA: Diagnosis not present

## 2024-02-03 ENCOUNTER — Emergency Department (HOSPITAL_COMMUNITY)
Admission: EM | Admit: 2024-02-03 | Discharge: 2024-02-04 | Disposition: A | Discharge status: Home / Self Care | Attending: Emergency Medicine | Admitting: Emergency Medicine

## 2024-02-03 ENCOUNTER — Emergency Department (HOSPITAL_COMMUNITY)

## 2024-02-03 ENCOUNTER — Ambulatory Visit (HOSPITAL_COMMUNITY)
Admission: EM | Admit: 2024-02-03 | Discharge: 2024-02-03 | Disposition: A | Discharge status: Home / Self Care | Attending: Emergency Medicine | Admitting: Emergency Medicine

## 2024-02-03 ENCOUNTER — Ambulatory Visit (INDEPENDENT_AMBULATORY_CARE_PROVIDER_SITE_OTHER)

## 2024-02-03 ENCOUNTER — Encounter (HOSPITAL_COMMUNITY): Payer: Self-pay

## 2024-02-03 DIAGNOSIS — I10 Essential (primary) hypertension: Secondary | ICD-10-CM | POA: Diagnosis not present

## 2024-02-03 DIAGNOSIS — R062 Wheezing: Secondary | ICD-10-CM

## 2024-02-03 DIAGNOSIS — I771 Stricture of artery: Secondary | ICD-10-CM | POA: Diagnosis not present

## 2024-02-03 DIAGNOSIS — E049 Nontoxic goiter, unspecified: Secondary | ICD-10-CM | POA: Diagnosis not present

## 2024-02-03 DIAGNOSIS — R0602 Shortness of breath: Secondary | ICD-10-CM

## 2024-02-03 DIAGNOSIS — R0789 Other chest pain: Secondary | ICD-10-CM | POA: Insufficient documentation

## 2024-02-03 DIAGNOSIS — R0989 Other specified symptoms and signs involving the circulatory and respiratory systems: Secondary | ICD-10-CM | POA: Diagnosis not present

## 2024-02-03 DIAGNOSIS — R0609 Other forms of dyspnea: Secondary | ICD-10-CM | POA: Insufficient documentation

## 2024-02-03 DIAGNOSIS — R609 Edema, unspecified: Secondary | ICD-10-CM

## 2024-02-03 LAB — BRAIN NATRIURETIC PEPTIDE: B Natriuretic Peptide: 47.1 pg/mL (ref 0.0–100.0)

## 2024-02-03 LAB — CBC
HCT: 38.9 % (ref 36.0–46.0)
Hemoglobin: 11.7 g/dL — ABNORMAL LOW (ref 12.0–15.0)
MCH: 24.1 pg — ABNORMAL LOW (ref 26.0–34.0)
MCHC: 30.1 g/dL (ref 30.0–36.0)
MCV: 80.2 fL (ref 80.0–100.0)
Platelets: 310 10*3/uL (ref 150–400)
RBC: 4.85 MIL/uL (ref 3.87–5.11)
RDW: 17 % — ABNORMAL HIGH (ref 11.5–15.5)
WBC: 5.2 10*3/uL (ref 4.0–10.5)
nRBC: 0 % (ref 0.0–0.2)

## 2024-02-03 LAB — COMPREHENSIVE METABOLIC PANEL
ALT: 8 U/L (ref 0–44)
AST: 16 U/L (ref 15–41)
Albumin: 3.8 g/dL (ref 3.5–5.0)
Alkaline Phosphatase: 62 U/L (ref 38–126)
Anion gap: 6 (ref 5–15)
BUN: 12 mg/dL (ref 8–23)
CO2: 28 mmol/L (ref 22–32)
Calcium: 8.9 mg/dL (ref 8.9–10.3)
Chloride: 105 mmol/L (ref 98–111)
Creatinine, Ser: 0.61 mg/dL (ref 0.44–1.00)
GFR, Estimated: >60 mL/min (ref >60)
Glucose, Bld: 87 mg/dL (ref 70–99)
Potassium: 3.9 mmol/L (ref 3.5–5.1)
Sodium: 139 mmol/L (ref 135–145)
Total Bilirubin: 0.8 mg/dL (ref 0.0–1.2)
Total Protein: 7.7 g/dL (ref 6.5–8.1)

## 2024-02-03 LAB — MAGNESIUM: Magnesium: 1.9 mg/dL (ref 1.7–2.4)

## 2024-02-03 MED ORDER — PREDNISONE 10 MG (21) PO TBPK: ORAL_TABLET | Freq: Every day | ORAL | 0 refills | Status: DC

## 2024-02-03 NOTE — ED Provider Triage Note (Signed)
 Emergency Medicine Provider Triage Evaluation Note  Ariel Henry , a 73 y.o. female  was evaluated in triage.  Pt complains of swelling and shortness of breath with walkingPt sent here from Urgent care   Review of Systems  Positive: Short of breath Negative: fever  Physical Exam  BP (!) 140/79 (BP Location: Left Arm)   Pulse 72   Temp 98.3 F (36.8 C) (Oral)   Resp 20   SpO2 96%  Gen:   Awake, no distress   Resp:  Normal effort  MSK:   Moves extremities without difficulty  Other:    Medical Decision Making  Medically screening exam initiated at 4:03 PM.  Appropriate orders placed.  Ariel Henry was informed that the remainder of the evaluation will be completed by another provider, this initial triage assessment does not replace that evaluation, and the importance of remaining in the ED until their evaluation is complete.     Ariel Henry, [place] 02/03/24 4166

## 2024-02-03 NOTE — Discharge Instructions (Addendum)
 you will need to go to the emergency room for further testing and lab work

## 2024-02-03 NOTE — ED Provider Notes (Signed)
 [facility]    CSN: [id number] Arrival date & time: 02/03/24  1136      History   Chief Complaint Chief Complaint  Patient presents with   Shortness of Breath    HPI Ariel Henry is a 73 y.o. female.   Patient presents today with shortness of breath during exertion x 1 week.  Patient states that she feels better with sitting down.  She takes a fluid pill every other day and states after taking this he feels much better.  Denies any chest pain.  Patient is speaking in complete sentences.  Denies any fever.  Large amount of edema to lower extremities.  Audible wheezing    Past Medical History:  Diagnosis Date   Arthritis    DVT (deep venous thrombosis) (HCC)    Glaucoma    Hypertension    Neuromuscular disorder (HCC)    carpal tunnel both wrists   Obesity    Pneumonia    Thyroid disease     Patient Active Problem List   Diagnosis Date Noted   Right leg pain 12/22/2016   Glaucoma 12/22/2016   Hypothyroidism 12/22/2016   Pulmonary embolism (HCC) 12/21/2016   Acute respiratory failure (HCC) 12/14/2016   Special screening for malignant neoplasms, colon    Lipoma of colon     Past Surgical History:  Procedure Laterality Date   ABDOMINAL HYSTERECTOMY     bladder tack     COLONOSCOPY WITH PROPOFOL N/A 09/04/2016   Procedure: COLONOSCOPY WITH PROPOFOL;  Surgeon: Napoleon Form, MD;  Location: [facility];  Service: Endoscopy;  Laterality: N/A;   EYE SURGERY     bilateral cataract extraction    OB History   No obstetric history on file.      Home Medications    Prior to Admission medications   Medication Sig Start Date End Date Taking? Authorizing Provider  albuterol (PROVENTIL HFA;VENTOLIN HFA) 108 (90 BASE) MCG/ACT inhaler Inhale 1-2 puffs into the lungs every 6 (six) hours as needed for wheezing. 07/12/13   Vanetta Mulders, MD  amLODipine (NORVASC) 2.5 MG tablet Take 2.5 mg by mouth daily. 03/24/22   [provider]   brimonidine (ALPHAGAN) 0.2 % ophthalmic solution Place 1 drop into the left eye in the morning and at bedtime.    [provider]  cetirizine (ZYRTEC) 10 MG tablet Take 1 tablet (10 mg total) by mouth daily. Patient not taking: Reported on 06/08/2022 05/10/18   Georgetta Haber, NP  [employer] D3 25 MCG (1000 UT) capsule Take 1,000 Units by mouth daily. 12/21/21   [provider]  dextromethorphan-guaiFENesin (TUSSIN DM) 10-100 MG/5ML liquid Take 10 mLs by mouth every 6 (six) hours as needed for cough.    [provider]  diphenhydramine-acetaminophen (TYLENOL PM) 25-500 MG TABS tablet Take 1 tablet by mouth at bedtime as needed (pain).    [provider]  dorzolamide-timolol (COSOPT) 22.3-6.8 MG/ML ophthalmic solution Place 1 drop into the left eye in the morning.    [provider]  ipratropium (ATROVENT) 0.06 % nasal spray Place 2 sprays into both nostrils 3 (three) times daily. Patient not taking: Reported on 06/08/2022 05/10/18   Linus Mako B, NP  latanoprost (XALATAN) 0.005 % ophthalmic solution Place 1 drop into the left eye at bedtime.    [provider]  Lin[facility] ARTHRITIS PAIN RELIEF EX) Apply 1 patch topically daily as needed (pain).    [provider]  Polyethyl Glycol-Propyl Glycol (SYSTANE ULTRA OP)  Apply 1 drop to eye daily as needed (dry eyes).    [provider]  Rivaroxaban 15 & 20 MG TBPK Take as directed on package: Start with one 15mg  tablet by mouth twice a day with food. On Day 22, switch to one 20mg  tablet once a day with food. Patient not taking: Reported on 06/08/2022 12/22/16   Edsel Petrin, DO  triamterene-hydrochlorothiazide (MAXZIDE-25) 37.5-25 MG tablet Take 1 tablet by mouth daily. 05/01/22   [provider]  XARELTO 20 MG TABS tablet Take 20 mg by mouth every evening. 04/18/22   [provider]    Family History Family History  Problem Relation Age of Onset   Colon  cancer Neg Hx     Social History Social History   Tobacco Use   Smoking status: Never   Smokeless tobacco: Never  Vaping Use   Vaping status: Never Used  Substance Use Topics   Alcohol use: No   Drug use: No     Allergies   Patient has no known allergies.   Review of Systems Review of Systems  Constitutional:  Negative for chills and fever.  HENT:  Positive for rhinorrhea and sinus pain.   Eyes: Negative.   Respiratory:  Positive for cough, shortness of breath and wheezing.   Cardiovascular: Negative.  Negative for chest pain.  Gastrointestinal: Negative.   Genitourinary: Negative.   Musculoskeletal:        Walks with a cane.  Has chronic pitting edema to lower extremities bilateral  Neurological: Negative.  Negative for dizziness, syncope and headaches.     Physical Exam Triage Vital Signs ED Triage Vitals [02/03/24 1304]  Encounter Vitals Group     BP (!) 151/82     Systolic BP Percentile      Diastolic BP Percentile      Pulse Rate 79     Resp 20     Temp 98.7 F (37.1 C)     Temp Source Oral     SpO2 94 %     Weight      Height      Head Circumference      Peak Flow      Pain Score 0     Pain Loc      Pain Education      Exclude from Growth Chart    No data found.  Updated Vital Signs BP (!) 151/82 (BP Location: Right Arm)   Pulse 79   Temp 98.7 F (37.1 C) (Oral)   Resp 20   SpO2 94%   Visual Acuity Right Eye Distance:   Left Eye Distance:   Bilateral Distance:    Right Eye Near:   Left Eye Near:    Bilateral Near:     Physical Exam Constitutional:      Appearance: She is obese.  Cardiovascular:     Rate and Rhythm: Normal rate.  Pulmonary:     Effort: Pulmonary effort is normal.     Breath sounds: Examination of the right-middle field reveals wheezing. Examination of the right-lower field reveals wheezing. Wheezing present.  Musculoskeletal:     Cervical back: Normal range of motion.     Right lower leg: Edema present.      Left lower leg: Edema present.     Comments: +2 pitting edema to bilateral lower extremities and knees  Neurological:     General: No focal deficit present.     Mental Status: She is alert.  UC Treatments / Results  Labs (all labs ordered are listed, but only abnormal results are displayed) Labs Reviewed - No data to display  EKG   Radiology No results found.  Procedures Procedures (including critical care time)  Medications Ordered in UC Medications - No data to display  Initial Impression / Assessment and Plan / UC Course  I have reviewed the triage vital signs and the nursing notes.  Pertinent labs & imaging results that were available during my care of the patient were reviewed by me and considered in my medical decision making (see chart for details).     Discussed with patient and son further evaluation is needed and test to be completed is recommended for patient to go to [facility] emergency room.  Patient's son will transport Patient's room O2 sats 92% on room air    Final Clinical Impressions(s) / UC Diagnoses   Final diagnoses:  SOB (shortness of breath)  Wheezing  3+ pitting edema     Discharge Instructions      Continue to use your albuterol inhaler as needed for shortness of breath If symptoms have not resolved or are better in the next 24 hours you will need to go to the emergency room for further testing and lab work      ED Prescriptions     Medication Sig Dispense Auth. Provider   predniSONE (STERAPRED UNI-PAK 21 TAB) 10 MG (21) TBPK tablet  (Status: Discontinued) Take by mouth daily. Take 6 tabs by mouth daily  for 2 days, then 5 tabs for 2 days, then 4 tabs for 2 days, then 3 tabs for 2 days, 2 tabs for 2 days, then 1 tab by mouth daily for 2 days 42 tablet Coralyn Mark, NP      PDMP not reviewed this encounter.   Coralyn Mark, NP 02/03/24 [phone]

## 2024-02-03 NOTE — ED Triage Notes (Signed)
 Pt was referred to ED for DOE worsening and leg swelling over the last week. Pt denies hx of CHF, not currently on diuretics. Pt denies CP.

## 2024-02-03 NOTE — ED Triage Notes (Signed)
 Pt c/o increased SOB on exertion x1wk. States when she sits down it gets better. Speaking in complete sentences. NAD

## 2024-02-03 NOTE — ED Notes (Signed)
 EKG done

## 2024-02-04 LAB — TROPONIN I (HIGH SENSITIVITY): Troponin I (High Sensitivity): 4 ng/L (ref <18)

## 2024-02-04 MED ORDER — FUROSEMIDE 10 MG/ML IJ SOLN
60.0000 mg | Freq: Once | INTRAMUSCULAR | Status: AC
  Administered 2024-02-04: 60 mg via INTRAVENOUS
  Filled 2024-02-04: qty 8

## 2024-02-04 NOTE — Discharge Instructions (Signed)
 Please continue your home medications and call your PCP in the morning. Return with any new or worsening symptoms.

## 2024-02-04 NOTE — ED Provider Notes (Signed)
 Emergency Department Provider Note   I have reviewed the triage vital signs and the nursing notes.   HISTORY  Chief Complaint Shortness of Breath   HPI Ariel Henry is a 73 y.o. female with past history of hypertension presents to the emergency department with shortness of breath and leg swelling.  She is compliant with her fluid pill every other day along with her other medications.  She states over the past several days she has had some increased shortness of breath with ambulation and a sense of heaviness in her chest.  She does not recognize this as chest pain and states symptoms resolved with rest.  She initially went to urgent care and was referred here for further evaluation.  She had a chest x-ray done at that visit.  Denies fevers or shaking chills.  No rest discomfort in the chest.   Past Medical History:  Diagnosis Date   Arthritis    DVT (deep venous thrombosis) (HCC)    Glaucoma    Hypertension    Neuromuscular disorder (HCC)    carpal tunnel both wrists   Obesity    Pneumonia    Thyroid disease     Review of Systems  Constitutional: No fever/chills Cardiovascular: Positive chest pain. Respiratory: Positive shortness of breath. Gastrointestinal: No abdominal pain.  No nausea, no vomiting.  Genitourinary: Negative for dysuria. Musculoskeletal: Negative for back pain. Skin: Negative for rash. Neurological: Negative for headaches. ____________________________________________   PHYSICAL EXAM:  VITAL SIGNS: ED Triage Vitals  Encounter Vitals Group     BP 02/03/24 1501 (!) 140/79     Pulse Rate 02/03/24 1501 72     Resp 02/03/24 1501 20     Temp 02/03/24 1501 98.3 F (36.8 C)     Temp Source 02/03/24 1501 Oral     SpO2 02/03/24 1501 96 %   Constitutional: Alert and oriented. Well appearing and in no acute distress. Eyes: Conjunctivae are normal.  Head: Atraumatic. Nose: No congestion/rhinnorhea. Mouth/Throat: Mucous membranes are  moist. Neck: No stridor.   Cardiovascular: Normal rate, regular rhythm. Good peripheral circulation. Grossly normal heart sounds.   Respiratory: Normal respiratory effort.  No retractions. Lungs CTAB. No wheezing.  Gastrointestinal: Soft and nontender. No distention.  Musculoskeletal: No lower extremity tenderness with mild edema in the bilateral LEs. No gross deformities of extremities. Neurologic:  Normal speech and language. No gross focal neurologic deficits are appreciated.  Skin:  Skin is warm, dry and intact. No rash noted.  ____________________________________________   LABS (all labs ordered are listed, but only abnormal results are displayed)  Labs Reviewed  CBC - Abnormal; Notable for the following components:      Result Value   Hemoglobin 11.7 (*)    MCH 24.1 (*)    RDW 17.0 (*)    All other components within normal limits  MAGNESIUM  BRAIN NATRIURETIC PEPTIDE  COMPREHENSIVE METABOLIC PANEL  TROPONIN I (HIGH SENSITIVITY)   ____________________________________________  EKG   EKG Interpretation Date/Time:  Monday February 03 2024 15:25:49 EDT Ventricular Rate:  79 PR Interval:  158 QRS Duration:  93 QT Interval:  360 QTC Calculation: 413 R Axis:   -11  Text Interpretation: Sinus rhythm LVH by voltage Confirmed by Alona Bene [phone]) on 02/04/2024 12:59:43 AM        ____________________________________________  RADIOLOGY  DG Chest 2 View Result Date: 02/03/2024 CLINICAL DATA:  Shortness of breath. EXAM: CHEST - 2 VIEW COMPARISON:  06/08/2022 radiograph and CT FINDINGS: Lung volumes are  low. The heart is normal in size for technique. Aortic tortuosity is before. Aortic atherosclerosis. Paratracheal density and rightward tracheal shift due to enlarged thyroid with substernal extension. No acute airspace disease, large pleural effusion or pneumothorax. No acute osseous findings. Chronic eventration of right hemidiaphragm. IMPRESSION: 1. Low lung volumes without  acute chest findings. 2. Enlarged thyroid with substernal extension. Electronically Signed   By: Narda Rutherford M.D.   On: 02/03/2024 16:18    ____________________________________________   PROCEDURES  Procedure(s) performed:   Procedures  None  ____________________________________________   INITIAL IMPRESSION / ASSESSMENT AND PLAN / ED COURSE  Pertinent labs & imaging results that were available during my care of the patient were reviewed by me and considered in my medical decision making (see chart for details).   This patient is Presenting for Evaluation of CP, which does require a range of treatment options, and is a complaint that involves a high risk of morbidity and mortality.  The Differential Diagnoses includes but is not exclusive to acute coronary syndrome, aortic dissection, pulmonary embolism, cardiac tamponade, community-acquired pneumonia, pericarditis, musculoskeletal chest wall pain, etc.   Critical Interventions-    Medications  furosemide (LASIX) injection 60 mg (60 mg Intravenous Given 02/04/24 0106)    Reassessment after intervention: Diuresis in the ED.   Clinical Laboratory Tests Ordered, included BNP normal.  No acute kidney injury on CMP.  No severe anemia.  Magnesium normal at 1.9.  Cardiac Monitor Tracing which shows NSR.   Medical Decision Making: Summary:  Patient presents emergency department with some exertional dyspnea.  She is in no respiratory distress on my assessment.  No hypoxemia.  She does have some bilateral lower extremity swelling but on x-ray from urgent care, which I reviewed, there is no evidence of pulmonary edema, infiltrate, or other acute process in the chest.  Plan for troponin here with some discomfort in the chest/heaviness but EKG not consistent with acute infarct.  Reevaluation with update and discussion with patient. Diuresis successful in the ED. Patient feeling well. Has been up and ambulatory to the bathroom without  difficulty. Appears stable for discharge.   Considered admission but workup is largely reassuring. Stable for outpatient mgmt.   Patient's presentation is most consistent with acute presentation with potential threat to life or bodily function.   Disposition: discharge  ____________________________________________  FINAL CLINICAL IMPRESSION(S) / ED DIAGNOSES  Final diagnoses:  Dyspnea on exertion  Chest tightness    Note:  This document was prepared using Dragon voice recognition software and may include unintentional dictation errors.  Alona Bene, MD, [facility] Emergency Medicine    Jaidynn Balster, Arlyss Repress, MD 02/04/24 [phone]

## 2024-03-06 DIAGNOSIS — L918 Other hypertrophic disorders of the skin: Secondary | ICD-10-CM | POA: Diagnosis not present

## 2024-03-23 DIAGNOSIS — H1045 Other chronic allergic conjunctivitis: Secondary | ICD-10-CM | POA: Diagnosis not present

## 2024-03-23 DIAGNOSIS — E559 Vitamin D deficiency, unspecified: Secondary | ICD-10-CM | POA: Diagnosis not present

## 2024-03-23 DIAGNOSIS — H0102A Squamous blepharitis right eye, upper and lower eyelids: Secondary | ICD-10-CM | POA: Diagnosis not present

## 2024-03-23 DIAGNOSIS — D509 Iron deficiency anemia, unspecified: Secondary | ICD-10-CM | POA: Diagnosis not present

## 2024-03-23 DIAGNOSIS — H0102B Squamous blepharitis left eye, upper and lower eyelids: Secondary | ICD-10-CM | POA: Diagnosis not present

## 2024-03-23 DIAGNOSIS — H401131 Primary open-angle glaucoma, bilateral, mild stage: Secondary | ICD-10-CM | POA: Diagnosis not present

## 2024-03-23 DIAGNOSIS — J4520 Mild intermittent asthma, uncomplicated: Secondary | ICD-10-CM | POA: Diagnosis not present

## 2024-03-23 DIAGNOSIS — M1990 Unspecified osteoarthritis, unspecified site: Secondary | ICD-10-CM | POA: Diagnosis not present

## 2024-03-23 DIAGNOSIS — Z86711 Personal history of pulmonary embolism: Secondary | ICD-10-CM | POA: Diagnosis not present

## 2024-03-23 DIAGNOSIS — I10 Essential (primary) hypertension: Secondary | ICD-10-CM | POA: Diagnosis not present

## 2024-03-23 DIAGNOSIS — R7303 Prediabetes: Secondary | ICD-10-CM | POA: Diagnosis not present

## 2024-04-06 DIAGNOSIS — R0602 Shortness of breath: Secondary | ICD-10-CM | POA: Diagnosis not present

## 2024-04-06 DIAGNOSIS — I10 Essential (primary) hypertension: Secondary | ICD-10-CM | POA: Diagnosis not present

## 2024-04-06 DIAGNOSIS — M1990 Unspecified osteoarthritis, unspecified site: Secondary | ICD-10-CM | POA: Diagnosis not present

## 2024-04-06 DIAGNOSIS — E559 Vitamin D deficiency, unspecified: Secondary | ICD-10-CM | POA: Diagnosis not present

## 2024-04-06 DIAGNOSIS — R7303 Prediabetes: Secondary | ICD-10-CM | POA: Diagnosis not present

## 2024-04-06 DIAGNOSIS — Z86711 Personal history of pulmonary embolism: Secondary | ICD-10-CM | POA: Diagnosis not present

## 2024-04-06 DIAGNOSIS — Z0001 Encounter for general adult medical examination with abnormal findings: Secondary | ICD-10-CM | POA: Diagnosis not present

## 2024-04-06 DIAGNOSIS — J4520 Mild intermittent asthma, uncomplicated: Secondary | ICD-10-CM | POA: Diagnosis not present

## 2024-04-06 DIAGNOSIS — D509 Iron deficiency anemia, unspecified: Secondary | ICD-10-CM | POA: Diagnosis not present

## 2024-04-22 ENCOUNTER — Encounter (HOSPITAL_COMMUNITY): Payer: Self-pay

## 2024-04-22 ENCOUNTER — Emergency Department (HOSPITAL_COMMUNITY)
Admission: EM | Admit: 2024-04-22 | Discharge: 2024-04-23 | Disposition: A | Discharge status: Home / Self Care | Attending: Emergency Medicine | Admitting: Emergency Medicine

## 2024-04-22 ENCOUNTER — Emergency Department (HOSPITAL_COMMUNITY)

## 2024-04-22 ENCOUNTER — Other Ambulatory Visit: Payer: Self-pay

## 2024-04-22 DIAGNOSIS — Z79899 Other long term (current) drug therapy: Secondary | ICD-10-CM | POA: Insufficient documentation

## 2024-04-22 DIAGNOSIS — Z7901 Long term (current) use of anticoagulants: Secondary | ICD-10-CM | POA: Diagnosis not present

## 2024-04-22 DIAGNOSIS — R079 Chest pain, unspecified: Secondary | ICD-10-CM | POA: Diagnosis present

## 2024-04-22 DIAGNOSIS — I700 Atherosclerosis of aorta: Secondary | ICD-10-CM | POA: Diagnosis not present

## 2024-04-22 DIAGNOSIS — I110 Hypertensive heart disease with heart failure: Secondary | ICD-10-CM | POA: Diagnosis not present

## 2024-04-22 DIAGNOSIS — I10 Essential (primary) hypertension: Secondary | ICD-10-CM | POA: Diagnosis not present

## 2024-04-22 DIAGNOSIS — R0989 Other specified symptoms and signs involving the circulatory and respiratory systems: Secondary | ICD-10-CM | POA: Diagnosis not present

## 2024-04-22 DIAGNOSIS — I509 Heart failure, unspecified: Secondary | ICD-10-CM | POA: Diagnosis not present

## 2024-04-22 DIAGNOSIS — R0602 Shortness of breath: Secondary | ICD-10-CM | POA: Diagnosis not present

## 2024-04-22 DIAGNOSIS — I5043 Acute on chronic combined systolic (congestive) and diastolic (congestive) heart failure: Secondary | ICD-10-CM | POA: Diagnosis not present

## 2024-04-22 LAB — CBC
HCT: 35.2 % — ABNORMAL LOW (ref 36.0–46.0)
Hemoglobin: 10.6 g/dL — ABNORMAL LOW (ref 12.0–15.0)
MCH: 23.6 pg — ABNORMAL LOW (ref 26.0–34.0)
MCHC: 30.1 g/dL (ref 30.0–36.0)
MCV: 78.2 fL — ABNORMAL LOW (ref 80.0–100.0)
Platelets: 385 10*3/uL (ref 150–400)
RBC: 4.5 MIL/uL (ref 3.87–5.11)
RDW: 17.6 % — ABNORMAL HIGH (ref 11.5–15.5)
WBC: 5.1 10*3/uL (ref 4.0–10.5)
nRBC: 0 % (ref 0.0–0.2)

## 2024-04-22 LAB — BRAIN NATRIURETIC PEPTIDE: B Natriuretic Peptide: 16.4 pg/mL (ref 0.0–100.0)

## 2024-04-22 LAB — BASIC METABOLIC PANEL WITH GFR
Anion gap: 9 (ref 5–15)
BUN: 16 mg/dL (ref 8–23)
CO2: 25 mmol/L (ref 22–32)
Calcium: 9.4 mg/dL (ref 8.9–10.3)
Chloride: 105 mmol/L (ref 98–111)
Creatinine, Ser: 1.01 mg/dL — ABNORMAL HIGH (ref 0.44–1.00)
GFR, Estimated: 60 mL/min — ABNORMAL LOW (ref >60)
Glucose, Bld: 102 mg/dL — ABNORMAL HIGH (ref 70–99)
Potassium: 3.9 mmol/L (ref 3.5–5.1)
Sodium: 139 mmol/L (ref 135–145)

## 2024-04-22 LAB — TROPONIN I (HIGH SENSITIVITY): Troponin I (High Sensitivity): 5 ng/L (ref <18)

## 2024-04-22 MED ORDER — FUROSEMIDE 10 MG/ML IJ SOLN
40.0000 mg | Freq: Once | INTRAMUSCULAR | Status: AC
  Administered 2024-04-22: 40 mg via INTRAVENOUS
  Filled 2024-04-22: qty 4

## 2024-04-22 NOTE — ED Provider Notes (Signed)
 [place] EMERGENCY DEPARTMENT AT [facility] Provider Note   CSN: [id number] Arrival date & time: 04/22/24  2203     History Chief Complaint  Patient presents with  . Shortness of Breath  . Chest Pain    HPI Ariel Henry is a 73 y.o. female presenting for chief complaint of chest pain and shortness of breath.. 73 year old female with a history of PE on Xarelto , hypertension on meds. Patient's recorded medical, surgical, social, medication list and allergies were reviewed in the Snapshot window as part of the initial history.   Review of Systems   Review of Systems  Constitutional:  Negative for chills and fever.  HENT:  Negative for ear pain and sore throat.   Eyes:  Negative for pain and visual disturbance.  Respiratory:  Positive for shortness of breath. Negative for cough.   Cardiovascular:  Positive for chest pain. Negative for palpitations.  Gastrointestinal:  Negative for abdominal pain and vomiting.  Genitourinary:  Negative for dysuria and hematuria.  Musculoskeletal:  Negative for arthralgias and back pain.  Skin:  Negative for color change and rash.  Neurological:  Negative for seizures and syncope.  All other systems reviewed and are negative.   Physical Exam Updated Vital Signs BP (!) 154/82   Pulse 77   Temp 98.2 F (36.8 C) (Oral)   Resp 18   Ht 4\' 9"  (1.448 m)   Wt 123.4 kg   SpO2 98%   BMI 58.86 kg/m  Physical Exam   ED Course/ Medical Decision Making/ A&P Clinical Course as of 04/22/24 2326  Wed Apr 22, 2024  2316 Leg swelling Weight gain Told she was volume overloaded and to come get diureses On Torsemide 20mg  daily   [CC]  2318 Check BNP and diusres  [CC]    Clinical Course User Index [CC] Onetha Bile, MD    Procedures Procedures   Medications Ordered in ED Medications - No data to display  Medical Decision Making:   Ariel Henry is a 73 y.o. female who presented to the ED today with  *** detailed above.    {crccomplexity:27900} Complete initial physical exam performed, notably the patient  was ***.    Reviewed and confirmed nursing documentation for past medical history, family history, social history.    Initial Assessment:   With the patient's presentation of ***, most likely diagnosis is ***. Other diagnoses were considered including (but not limited to) ***. These are considered less likely due to history of present illness and physical exam findings.   {crccopa:27899}  Initial Plan:  ***  ***Screening labs including CBC and Metabolic panel to evaluate for infectious or metabolic etiology of disease.  ***Urinalysis with reflex culture ordered to evaluate for UTI or relevant urologic/nephrologic pathology.  ***CXR to evaluate for structural/infectious intrathoracic pathology.  {crccardiactesting:32591::"EKG to evaluate for cardiac pathology"} Objective evaluation as below reviewed   Initial Study Results:   Laboratory  All laboratory results reviewed without evidence of clinically relevant pathology.   ***Exceptions include: ***   ***EKG EKG was reviewed independently. Rate, rhythm, axis, intervals all examined and without medically relevant abnormality. ST segments without concerns for elevations.    Radiology:  All images reviewed independently. ***Agree with radiology report at this time.   DG Chest 2 View Result Date: 04/22/2024 CLINICAL DATA:  shortness of breath EXAM: CHEST - 2 VIEW COMPARISON:  Chest x-ray 02/03/2024, CT chest 06/08/2022 FINDINGS: The heart and mediastinal contours are unchanged atherosclerotic plaque. Low lung volumes. Density  overlying bilateral apices consistent with known enlarged thyroid . No focal consolidation. No pulmonary edema. No pleural effusion. No pneumothorax. No acute osseous abnormality. IMPRESSION: 1. Low lung volumes with no active cardiopulmonary disease. 2.  Aortic Atherosclerosis (ICD10-I70.0). Electronically Signed    By: Morgane  Naveau M.D.   On: 04/22/2024 22:51      Consults: Case discussed with ***.   Reassessment and Plan:   ***    ***  Clinical Impression: No diagnosis found.   Data Unavailable   Final Clinical Impression(s) / ED Diagnoses Final diagnoses:  None    Rx / DC Orders ED Discharge Orders     None

## 2024-04-22 NOTE — ED Notes (Signed)
 Blue top sent to lab.

## 2024-04-22 NOTE — ED Triage Notes (Signed)
 Pt reports chest tightness and SHOB ongoing for the last couple days. Pt reports worsening today and has hx of PE. Pt takes xarelto . Reports swelling to both legs as well

## 2024-04-23 NOTE — Discharge Instructions (Signed)
 You are seen today for shortness of breath.  You appear to have a mild heart failure exacerbation.  We have treated you with IV Lasix  and your symptoms have improved dramatically.  Please take double your normal dose of torsemide (your fluid pill) for the next 2 days and follow-up with your primary care doctor next Monday.  Keep your scheduled cardiology appointment as we discussed.

## 2024-05-11 ENCOUNTER — Other Ambulatory Visit: Payer: Self-pay | Admitting: Endocrinology

## 2024-05-11 ENCOUNTER — Encounter: Payer: Self-pay | Admitting: Family

## 2024-05-11 ENCOUNTER — Ambulatory Visit (INDEPENDENT_AMBULATORY_CARE_PROVIDER_SITE_OTHER): Payer: Self-pay | Admitting: Family

## 2024-05-11 VITALS — BP 138/84 | HR 67 | Temp 97.6°F | Resp 20 | Ht <= 58 in | Wt 267.8 lb

## 2024-05-11 DIAGNOSIS — E559 Vitamin D deficiency, unspecified: Secondary | ICD-10-CM | POA: Diagnosis not present

## 2024-05-11 DIAGNOSIS — D509 Iron deficiency anemia, unspecified: Secondary | ICD-10-CM

## 2024-05-11 DIAGNOSIS — I10 Essential (primary) hypertension: Secondary | ICD-10-CM | POA: Diagnosis not present

## 2024-05-11 DIAGNOSIS — R0609 Other forms of dyspnea: Secondary | ICD-10-CM

## 2024-05-11 DIAGNOSIS — R7303 Prediabetes: Secondary | ICD-10-CM

## 2024-05-11 DIAGNOSIS — Z1159 Encounter for screening for other viral diseases: Secondary | ICD-10-CM

## 2024-05-11 DIAGNOSIS — Z86711 Personal history of pulmonary embolism: Secondary | ICD-10-CM

## 2024-05-11 DIAGNOSIS — Z7689 Persons encountering health services in other specified circumstances: Secondary | ICD-10-CM | POA: Diagnosis not present

## 2024-05-11 DIAGNOSIS — E2839 Other primary ovarian failure: Secondary | ICD-10-CM | POA: Diagnosis not present

## 2024-05-11 DIAGNOSIS — E039 Hypothyroidism, unspecified: Secondary | ICD-10-CM | POA: Diagnosis not present

## 2024-05-11 DIAGNOSIS — E010 Iodine-deficiency related diffuse (endemic) goiter: Secondary | ICD-10-CM

## 2024-05-11 MED ORDER — ALBUTEROL SULFATE HFA 108 (90 BASE) MCG/ACT IN AERS: 1.0000 | INHALATION_SPRAY | Freq: Four times a day (QID) | RESPIRATORY_TRACT | 3 refills | Status: DC | PRN

## 2024-05-11 MED ORDER — CVS D3 25 MCG (1000 UT) PO CAPS: 1000.0000 [IU] | ORAL_CAPSULE | Freq: Every day | ORAL | 3 refills | Status: DC

## 2024-05-11 NOTE — Progress Notes (Signed)
 Provider: Christean Courts FNP-C   Semira Stoltzfus, Elijio Guadeloupe, NP  Patient Care Team: Corben Auzenne, Elijio Guadeloupe, NP as PCP - General (Family Medicine)  Extended Emergency Contact Information Primary Emergency Contact: [facility] Address: [address]          [place], [place] [address] [place] Home Phone: [phone] Work Phone: [phone] Mobile Phone: [phone] Relation: Son Secondary Emergency Contact: Marybelle Smiling Address: [address]          [place], [place] [address] [place] Home Phone: [phone] Mobile Phone: [phone] Relation: Son  Code Status:  Full Code  Goals of care: Advanced Directive information    05/11/2024   12:42 PM  Advanced Directives  Does Patient Have a Medical Advance Directive? Yes  Type of Advance Directive Living will  Does patient want to make changes to medical advance directive? No - Patient declined     Chief Complaint  Patient presents with   Establish Care    New patient appointment.    Discussed the use of AI scribe software for clinical note transcription with the patient, who gave verbal consent to proceed.  History of Present Illness   Ariel Henry is a 73 year old female with hypertension, Hypothyroidism, Morbid obesity and a history of blood clots who presents to establish care.  She has a history of hypertension, currently managed with amlodipine 2.5 mg daily and triamterene with hydrochlorothiazide. She experiences shortness of breath and swelling in her legs. She has been given diuretics in the past for fluid retention and has a follow-up appointment with a cardiologist in September.  She has a history of thyroid  issues and undergoes annual monitoring with a specialist. Previous ultrasounds and blood work have shown no significant findings, and she is not on any medication for her thyroid  condition.  She has a history of blood clots in her legs, which she believes traveled to her lungs,  resulting in hospitalization and treatment with a blood thinner. She currently takes Xarelto  and uses an albuterol  inhaler as needed for breathing difficulties, up to three times a day.  She has been informed of anemia and regularly consumes spinach, kale, and has recently resumed eating beets. She is not on iron supplements.  She uses eye drops, including Latanoprost  and Cosopt, following cataract surgery and stent placement in her left eye. She also uses Systane for dry eyes.  She takes Zyrtec  for allergies and Atrovent  nasal spray for nasal congestion. Additionally, she takes vitamin D 1000 units daily due to previously low levels.  She denies any history of diabetes but notes a family history of the condition. Her previous lab work indicates prediabetes. Her mother had diabetes, hypertension, and high cholesterol. Her father died from alcohol-related issues, and her siblings have had cancer and heart issues.  She does not smoke or drink alcohol but consumes a lot of coffee and green tea. She has a history of a nut allergy and a reaction to soy milk, which caused breathing difficulties.  She reports difficulty sleeping, often getting only two hours of sleep at night and sometimes taking short naps during the day. She sleeps on the couch due to breathing difficulties when lying flat.   Past Medical History:  Diagnosis Date   Arthritis    DVT (deep venous thrombosis) (HCC)    Glaucoma    Hypertension    Neuromuscular disorder (HCC)    carpal tunnel both wrists   Obesity    Pneumonia    Thyroid   disease    Past Surgical History:  Procedure Laterality Date   ABDOMINAL HYSTERECTOMY     bladder tack     COLONOSCOPY WITH PROPOFOL  N/A 09/04/2016   Procedure: COLONOSCOPY WITH PROPOFOL ;  Surgeon: Sergio Dandy, MD;  Location: [facility];  Service: Endoscopy;  Laterality: N/A;   EYE SURGERY     bilateral cataract extraction    Allergies  Allergen Reactions   Alsoy Soy Formula  [Alimentum] Shortness Of Breath    Allergies as of 05/11/2024       Reactions   Alsoy Soy Formula [alimentum] Shortness Of Breath        Medication List        Accurate as of May 11, 2024  2:08 PM. If you have any questions, ask your nurse or doctor.          STOP taking these medications    SALONPAS ARTHRITIS PAIN RELIEF EX Stopped by: [place] C Narda Fundora       TAKE these medications    albuterol  108 (90 Base) MCG/ACT inhaler Commonly known as: VENTOLIN  HFA Inhale 1-2 puffs into the lungs every 6 (six) hours as needed for wheezing.   amLODipine 2.5 MG tablet Commonly known as: NORVASC Take 2.5 mg by mouth daily.   brimonidine 0.2 % ophthalmic solution Commonly known as: ALPHAGAN Place 1 drop into the left eye in the morning and at bedtime.   cetirizine  10 MG tablet Commonly known as: ZYRTEC  Take 1 tablet (10 mg total) by mouth daily.   [facility]S D3 25 MCG (1000 UT) capsule Generic drug: Cholecalciferol Take 1 capsule (1,000 Units total) by mouth daily.   diphenhydramine-acetaminophen  25-500 MG Tabs tablet Commonly known as: TYLENOL  PM Take 1 tablet by mouth at bedtime as needed (pain).   dorzolamide -timolol  2-0.5 % ophthalmic solution Commonly known as: COSOPT Place 1 drop into the left eye in the morning.   ipratropium 0.06 % nasal spray Commonly known as: ATROVENT  Place 2 sprays into both nostrils 3 (three) times daily.   latanoprost  0.005 % ophthalmic solution Commonly known as: XALATAN  Place 1 drop into the left eye at bedtime.   SYSTANE ULTRA OP Apply 1 drop to eye daily as needed (dry eyes).   triamterene-hydrochlorothiazide 37.5-25 MG tablet Commonly known as: MAXZIDE-25 Take 1 tablet by mouth daily.   Tussin DM 10-100 MG/5ML liquid Generic drug: dextromethorphan-guaiFENesin  Take 10 mLs by mouth every 6 (six) hours as needed for cough.   Xarelto  20 MG Tabs tablet Generic drug: rivaroxaban  Take 20 mg by mouth every evening. What changed:  Another medication with the same name was removed. Continue taking this medication, and follow the directions you see here. Changed by: Elijio Guadeloupe Xayne Brumbaugh        Review of Systems  Constitutional:  Negative for appetite change, chills, fatigue, fever and unexpected weight change.  HENT:  Negative for congestion, ear discharge, ear pain, hearing loss, nosebleeds, postnasal drip, rhinorrhea, sinus pressure, sinus pain, sneezing, sore throat, tinnitus and trouble swallowing.   Eyes:  Positive for visual disturbance. Negative for pain, discharge, redness and itching.       Wears corrective lens   Respiratory:  Negative for cough, chest tightness, shortness of breath and wheezing.        Dyspnea on exertion   Cardiovascular:  Positive for leg swelling. Negative for chest pain and palpitations.  Gastrointestinal:  Negative for abdominal distention, abdominal pain, blood in stool, constipation, diarrhea, nausea and vomiting.  Endocrine: Negative for cold intolerance, heat  intolerance, polydipsia, polyphagia and polyuria.  Genitourinary:  Negative for difficulty urinating, dysuria, flank pain, frequency and urgency.  Musculoskeletal:  Positive for gait problem. Negative for arthralgias, back pain, joint swelling, myalgias, neck pain and neck stiffness.       Walks with a cane   Skin:  Negative for color change, pallor, rash and wound.  Neurological:  Negative for dizziness, syncope, speech difficulty, weakness, light-headedness, numbness and headaches.  Hematological:  Does not bruise/bleed easily.  Psychiatric/Behavioral:  Positive for sleep disturbance. Negative for agitation, behavioral problems, confusion, hallucinations, self-injury and suicidal ideas. The patient is not nervous/anxious.     Immunization History  Administered Date(s) Administered   Influenza, High Dose Seasonal PF 07/19/2019   Influenza,inj,Quad PF,6+ Mos 07/02/2017, 07/30/2018   Influenza-Unspecified 09/11/2023    Pneumococcal Conjugate-13 07/19/2019   Unspecified SARS-COV-2 Vaccination 08/30/2022, 09/11/2023   Zoster Recombinant(Shingrix) 01/27/2024   Pertinent  Health Maintenance Due  Topic Date Due   DEXA SCAN  Never done   INFLUENZA VACCINE  06/26/2024   MAMMOGRAM  07/07/2025   Colonoscopy  09/04/2026      09/18/2019    1:41 PM 09/28/2019    7:56 PM 09/18/2021    1:14 PM 06/08/2022   11:27 AM 05/11/2024   12:41 PM  Fall Risk  Falls in the past year?     0  Was there an injury with Fall?     0  Fall Risk Category Calculator     0  (RETIRED) Patient Fall Risk Level Low fall risk  Low fall risk  Low fall risk  Low fall risk    Patient at Risk for Falls Due to     No Fall Risks  Fall risk Follow up     Falls evaluation completed     Data saved with a previous flowsheet row definition   Functional Status Survey:    Vitals:   05/11/24 1240  BP: 138/84  Pulse: 67  Resp: 20  Temp: 97.6 F (36.4 C)  SpO2: 96%  Weight: 267 lb 12.8 oz (121.5 kg)  Height: 4' 9 (1.448 m)   Body mass index is 57.95 kg/m. Physical Exam GENERAL: Alert, cooperative, well developed, no acute distress HEENT: Normocephalic, normal oropharynx, moist mucous membranes, ears normal, nose normal, eyes normal, no frontal sinus tenderness NECK: Neck normal CHEST: Clear to auscultation bilaterally, no wheezes, rhonchi, or crackles CARDIOVASCULAR: Normal heart rate and rhythm, S1 and S2 normal without murmurs ABDOMEN: Soft, non-tender, non-distended, without organomegaly, normal bowel sounds, no costovertebral angle tenderness EXTREMITIES: No cyanosis or edema, extremities normal NEUROLOGICAL: Cranial nerves grossly intact, moves all extremities without gross motor or sensory deficit SKIN: Ropy Varicose veins on right breast and mid -chest  PSYCHIATRY/BEHAVIORAL: Mood stable    Labs reviewed: Recent Labs    02/03/24 1622 04/22/24 2221  NA 139 139  K 3.9 3.9  CL 105 105  CO2 28 25  GLUCOSE 87 102*  BUN  12 16  CREATININE 0.61 1.01*  CALCIUM 8.9 9.4  MG 1.9  --    Recent Labs    02/03/24 1622  AST 16  ALT 8  ALKPHOS 62  BILITOT 0.8  PROT 7.7  ALBUMIN 3.8   Recent Labs    02/03/24 1622 04/22/24 2221  WBC 5.2 5.1  HGB 11.7* 10.6*  HCT 38.9 35.2*  MCV 80.2 78.2*  PLT 310 385   Lab Results  Component Value Date   TSH 0.898 09/18/2019   Lab Results  Component Value  Date   HGBA1C 5.8 (H) 12/14/2016   Lab Results  Component Value Date   TRIG 106 12/14/2016    Significant Diagnostic Results in last 30 days:  DG Chest 2 View Result Date: 04/22/2024 CLINICAL DATA:  shortness of breath EXAM: CHEST - 2 VIEW COMPARISON:  Chest x-ray 02/03/2024, CT chest 06/08/2022 FINDINGS: The heart and mediastinal contours are unchanged atherosclerotic plaque. Low lung volumes. Density overlying bilateral apices consistent with known enlarged thyroid . No focal consolidation. No pulmonary edema. No pleural effusion. No pneumothorax. No acute osseous abnormality. IMPRESSION: 1. Low lung volumes with no active cardiopulmonary disease. 2.  Aortic Atherosclerosis (ICD10-I70.0). Electronically Signed   By: Morgane  Naveau M.D.   On: 04/22/2024 22:51    Assessment/Plan    Dyspnea on exertion and lower extremity edema  Intermittent shortness of breath and fluid retention, possibly related to cardiac or pulmonary issues. Normal BNP levels suggest no acute heart failure. Awaiting cardiology follow-up in September. She is aware of the need to monitor symptoms and report any worsening. - Follow up with cardiologist in September  Hypertension Chronic hypertension managed with amlodipine and triamterene/hydrochlorothiazide. Blood pressure is well-controlled with current regimen. She understands the importance of medication adherence to prevent complications. - Continue amlodipine 2.5 mg daily - Continue triamterene/hydrochlorothiazide as prescribed  Hx of Deep Vein Thrombosis (DVT) and Pulmonary  Embolism DVT in the legs with subsequent pulmonary embolism. Currently on Xarelto  for anticoagulation. No recent episodes reported. She understands the importance of not missing doses to prevent recurrence. - Continue Xarelto  as prescribed - Ensure medication refills are up to date  Anemia Mild anemia noted on recent lab work. She is advised to increase dietary intake of iron-rich foods such as kale, collard greens, spinach, and beets. No current iron supplementation prescribed. She is aware of the importance of dietary changes to improve hemoglobin levels. - Increase dietary intake of iron-rich foods - Recheck hemoglobin levels during next lab work  Hypothyroidism  Thyroid  disorder with annual follow-up with endocrinologist. No current medication required. Recent ultrasound showed no enlargement. She follows up with endocrinologist annually for monitoring. - Continue annual follow-up with endocrinologist  Vitamin D deficiency  - on vit D supplement   Glaucoma Glaucoma with previous cataract surgery and stent placement in the left eye. Currently using Latanoprost  and Cosopt eye drops. She is compliant with eye drop regimen to manage intraocular pressure. - Continue Latanoprost  and Cosopt as prescribed - Use Systane for dry eyes as needed  General Health Maintenance She is due for several vaccinations and screenings. Tetanus booster is overdue, and shingles vaccine series is incomplete. Pneumonia vaccine status needs verification. Bone density screening has not been done. She is aware of the importance of staying up to date with vaccinations and screenings. - Get tetanus booster at pharmacy - Complete shingles vaccine series with flu shot - Verify pneumonia vaccine status with pharmacy - [facility] density screening [facility]  Follow-up She requires follow-up for lab work and routine monitoring. She is aware of the need for fasting before cholesterol testing and the  importance of regular monitoring. - Schedule lab work for cholesterol, anemia, and hepatitis C screening next Monday - Schedule follow-up appointment in six months for routine monitoring    Family/ staff Communication: Reviewed plan of care with patient verbalized understanding   Labs/tests ordered:  - CBC with Differential/Platelet - CMP with eGFR([facility]) - Lipid panel - Hep C Antibody  Next Appointment : Return in about 6 months (around  11/10/2024) for medical mangement of chronic issues., fasting labs in one week.   Spent 45 minutes of Face to face and non-face to face with patient  >50% time spent counseling; reviewing medical record; tests; labs; documentation and developing future plan of care.   Estil Heman, NP

## 2024-05-18 ENCOUNTER — Other Ambulatory Visit

## 2024-05-18 DIAGNOSIS — Z130 Encounter for screening for diseases of the blood and blood-forming organs and certain disorders involving the immune mechanism: Secondary | ICD-10-CM | POA: Diagnosis not present

## 2024-05-18 DIAGNOSIS — Z1159 Encounter for screening for other viral diseases: Secondary | ICD-10-CM | POA: Diagnosis not present

## 2024-05-18 DIAGNOSIS — I10 Essential (primary) hypertension: Secondary | ICD-10-CM

## 2024-05-19 ENCOUNTER — Ambulatory Visit: Payer: Self-pay | Admitting: Nurse Practitioner

## 2024-05-19 LAB — TEST AUTHORIZATION

## 2024-05-19 LAB — COMPLETE METABOLIC PANEL WITHOUT GFR
AG Ratio: 1.5 (calc) (ref 1.0–2.5)
ALT: 5 U/L — ABNORMAL LOW (ref 6–29)
AST: 11 U/L (ref 10–35)
Albumin: 4 g/dL (ref 3.6–5.1)
Alkaline phosphatase (APISO): 75 U/L (ref 37–153)
BUN: 10 mg/dL (ref 7–25)
CO2: 26 mmol/L (ref 20–32)
Calcium: 9.2 mg/dL (ref 8.6–10.4)
Chloride: 105 mmol/L (ref 98–110)
Creat: 0.8 mg/dL (ref 0.60–1.00)
Globulin: 2.7 g/dL (ref 1.9–3.7)
Glucose, Bld: 104 mg/dL — ABNORMAL HIGH (ref 65–99)
Potassium: 4 mmol/L (ref 3.5–5.3)
Sodium: 139 mmol/L (ref 135–146)
Total Bilirubin: 0.6 mg/dL (ref 0.2–1.2)
Total Protein: 6.7 g/dL (ref 6.1–8.1)

## 2024-05-19 LAB — LIPID PANEL
Cholesterol: 160 mg/dL (ref <200)
HDL: 59 mg/dL (ref >=50)
LDL Cholesterol (Calc): 86 mg/dL
Non-HDL Cholesterol (Calc): 101 mg/dL (ref <130)
Total CHOL/HDL Ratio: 2.7 (calc) (ref <5.0)
Triglycerides: 61 mg/dL (ref <150)

## 2024-05-19 LAB — CBC WITH DIFFERENTIAL/PLATELET
Absolute Lymphocytes: 865 {cells}/uL (ref 850–3900)
Absolute Monocytes: 307 {cells}/uL (ref 200–950)
Basophils Absolute: 50 {cells}/uL (ref 0–200)
Basophils Relative: 1.2 %
Eosinophils Absolute: 109 {cells}/uL (ref 15–500)
Eosinophils Relative: 2.6 %
HCT: 36.1 % (ref 35.0–45.0)
Hemoglobin: 10.3 g/dL — ABNORMAL LOW (ref 11.7–15.5)
MCH: 22 pg — ABNORMAL LOW (ref 27.0–33.0)
MCHC: 28.5 g/dL — ABNORMAL LOW (ref 32.0–36.0)
MCV: 77.1 fL — ABNORMAL LOW (ref 80.0–100.0)
MPV: 9.1 fL (ref 7.5–12.5)
Monocytes Relative: 7.3 %
Neutro Abs: 2869 {cells}/uL (ref 1500–7800)
Neutrophils Relative %: 68.3 %
Platelets: 334 10*3/uL (ref 140–400)
RBC: 4.68 10*6/uL (ref 3.80–5.10)
RDW: 16.5 % — ABNORMAL HIGH (ref 11.0–15.0)
Total Lymphocyte: 20.6 %
WBC: 4.2 10*3/uL (ref 3.8–10.8)

## 2024-05-19 LAB — IRON,TIBC AND FERRITIN PANEL
%SAT: 8 % — ABNORMAL LOW (ref 16–45)
Ferritin: 6 ng/mL — ABNORMAL LOW (ref 16–288)
Iron: 34 ug/dL — ABNORMAL LOW (ref 45–160)
TIBC: 403 ug/dL (ref 250–450)

## 2024-05-19 LAB — HEPATITIS C ANTIBODY: Hepatitis C Ab: NONREACTIVE

## 2024-05-20 ENCOUNTER — Other Ambulatory Visit: Payer: Self-pay

## 2024-05-21 ENCOUNTER — Other Ambulatory Visit: Payer: Self-pay

## 2024-05-21 DIAGNOSIS — D509 Iron deficiency anemia, unspecified: Secondary | ICD-10-CM

## 2024-05-21 DIAGNOSIS — Z1211 Encounter for screening for malignant neoplasm of colon: Secondary | ICD-10-CM

## 2024-05-25 ENCOUNTER — Ambulatory Visit
Admission: RE | Admit: 2024-05-25 | Discharge: 2024-05-25 | Disposition: A | Discharge status: Home / Self Care | Source: Ambulatory Visit | Attending: Endocrinology | Admitting: Endocrinology

## 2024-05-25 DIAGNOSIS — E049 Nontoxic goiter, unspecified: Secondary | ICD-10-CM | POA: Diagnosis not present

## 2024-05-25 DIAGNOSIS — E010 Iodine-deficiency related diffuse (endemic) goiter: Secondary | ICD-10-CM

## 2024-06-05 ENCOUNTER — Other Ambulatory Visit: Payer: Self-pay | Admitting: Family

## 2024-06-05 DIAGNOSIS — Z86711 Personal history of pulmonary embolism: Secondary | ICD-10-CM

## 2024-06-08 ENCOUNTER — Other Ambulatory Visit: Payer: Self-pay

## 2024-06-08 DIAGNOSIS — E669 Obesity, unspecified: Secondary | ICD-10-CM | POA: Diagnosis not present

## 2024-06-08 DIAGNOSIS — Z1211 Encounter for screening for malignant neoplasm of colon: Secondary | ICD-10-CM

## 2024-06-08 NOTE — Telephone Encounter (Signed)
 Medication hasn't been filled by Pcp

## 2024-06-10 LAB — FECAL GLOBIN BY IMMUNOCHEMISTRY
FECAL GLOBIN RESULT:: NOT DETECTED
MICRO NUMBER:: 16694566
SPECIMEN QUALITY:: ADEQUATE

## 2024-06-15 ENCOUNTER — Other Ambulatory Visit

## 2024-06-15 DIAGNOSIS — E010 Iodine-deficiency related diffuse (endemic) goiter: Secondary | ICD-10-CM | POA: Diagnosis not present

## 2024-06-15 DIAGNOSIS — I10 Essential (primary) hypertension: Secondary | ICD-10-CM | POA: Diagnosis not present

## 2024-06-15 DIAGNOSIS — R7301 Impaired fasting glucose: Secondary | ICD-10-CM | POA: Diagnosis not present

## 2024-06-15 DIAGNOSIS — D509 Iron deficiency anemia, unspecified: Secondary | ICD-10-CM

## 2024-06-15 DIAGNOSIS — E669 Obesity, unspecified: Secondary | ICD-10-CM | POA: Diagnosis not present

## 2024-06-15 LAB — CBC WITH DIFFERENTIAL/PLATELET
Absolute Lymphocytes: 974 {cells}/uL (ref 850–3900)
Absolute Monocytes: 311 {cells}/uL (ref 200–950)
Basophils Absolute: 50 {cells}/uL (ref 0–200)
Basophils Relative: 1.2 %
Eosinophils Absolute: 80 {cells}/uL (ref 15–500)
Eosinophils Relative: 1.9 %
HCT: 40.7 % (ref 35.0–45.0)
Hemoglobin: 11.5 g/dL — ABNORMAL LOW (ref 11.7–15.5)
MCH: 21.7 pg — ABNORMAL LOW (ref 27.0–33.0)
MCHC: 28.3 g/dL — ABNORMAL LOW (ref 32.0–36.0)
MCV: 76.6 fL — ABNORMAL LOW (ref 80.0–100.0)
MPV: 10 fL (ref 7.5–12.5)
Monocytes Relative: 7.4 %
Neutro Abs: 2785 {cells}/uL (ref 1500–7800)
Neutrophils Relative %: 66.3 %
Platelets: 353 Thousand/uL (ref 140–400)
RBC: 5.31 Million/uL — ABNORMAL HIGH (ref 3.80–5.10)
RDW: 19.3 % — ABNORMAL HIGH (ref 11.0–15.0)
Total Lymphocyte: 23.2 %
WBC: 4.2 Thousand/uL (ref 3.8–10.8)

## 2024-06-17 ENCOUNTER — Ambulatory Visit: Payer: Self-pay | Admitting: Family

## 2024-06-17 DIAGNOSIS — R7303 Prediabetes: Secondary | ICD-10-CM | POA: Diagnosis not present

## 2024-06-17 DIAGNOSIS — Z008 Encounter for other general examination: Secondary | ICD-10-CM | POA: Diagnosis not present

## 2024-06-17 DIAGNOSIS — I5030 Unspecified diastolic (congestive) heart failure: Secondary | ICD-10-CM | POA: Diagnosis not present

## 2024-06-26 ENCOUNTER — Other Ambulatory Visit: Payer: Self-pay | Admitting: Family

## 2024-06-26 DIAGNOSIS — Z1231 Encounter for screening mammogram for malignant neoplasm of breast: Secondary | ICD-10-CM

## 2024-07-03 ENCOUNTER — Other Ambulatory Visit: Payer: Self-pay | Admitting: Family

## 2024-07-03 DIAGNOSIS — J4520 Mild intermittent asthma, uncomplicated: Secondary | ICD-10-CM

## 2024-07-06 NOTE — Telephone Encounter (Signed)
 High Risk Warning Populated when attempting to refill, I will send to Provider for further review    Message sent to  Ngetich, Dinah C, NP

## 2024-07-13 DIAGNOSIS — H0102B Squamous blepharitis left eye, upper and lower eyelids: Secondary | ICD-10-CM | POA: Diagnosis not present

## 2024-07-13 DIAGNOSIS — H0102A Squamous blepharitis right eye, upper and lower eyelids: Secondary | ICD-10-CM | POA: Diagnosis not present

## 2024-07-13 DIAGNOSIS — H35373 Puckering of macula, bilateral: Secondary | ICD-10-CM | POA: Diagnosis not present

## 2024-07-13 DIAGNOSIS — H401131 Primary open-angle glaucoma, bilateral, mild stage: Secondary | ICD-10-CM | POA: Diagnosis not present

## 2024-07-13 DIAGNOSIS — H43812 Vitreous degeneration, left eye: Secondary | ICD-10-CM | POA: Diagnosis not present

## 2024-07-13 DIAGNOSIS — H1045 Other chronic allergic conjunctivitis: Secondary | ICD-10-CM | POA: Diagnosis not present

## 2024-07-20 ENCOUNTER — Ambulatory Visit
Admission: RE | Admit: 2024-07-20 | Discharge: 2024-07-20 | Disposition: A | Discharge status: Home / Self Care | Source: Ambulatory Visit | Attending: Family | Admitting: Family

## 2024-07-20 ENCOUNTER — Other Ambulatory Visit: Payer: Self-pay | Admitting: Family

## 2024-07-20 DIAGNOSIS — I10 Essential (primary) hypertension: Secondary | ICD-10-CM

## 2024-07-20 DIAGNOSIS — Z1231 Encounter for screening mammogram for malignant neoplasm of breast: Secondary | ICD-10-CM | POA: Diagnosis not present

## 2024-08-03 ENCOUNTER — Telehealth: Payer: Self-pay | Admitting: *Deleted

## 2024-08-03 MED ORDER — COVID-19 MRNA VACCINE (PFIZER) 30 MCG/0.3ML IM SUSP: 0.3000 mL | Freq: Once | INTRAMUSCULAR | 0 refills | Status: AC

## 2024-08-03 NOTE — Telephone Encounter (Signed)
Rx sent to pharmacy as requested.  Patient notified.

## 2024-08-03 NOTE — Telephone Encounter (Signed)
 Copied from CRM [id number]. Topic: Clinical - Medical Advice >> Aug 03, 2024  8:12 AM Rosaria A wrote: Reason for CRM: Patient states that [facility] stated that she would need her PCP to fax them something regarding her to be able to get her Covid booster. Patient states [facility] time she needs to get one. Please advise. The pharmacist stated that the president changed the way it is done and they now have to have a statement from the PCP.  Please call patient back. If you cannot get patient on her cell phone call the house phone [phone].   [id number] [address] [place], [place] - [address] AT [facility] [address] [place] [address] Phone: [phone] Fax: [phone]

## 2024-08-04 NOTE — Progress Notes (Signed)
 Cardiology Office Note:   Date:  08/10/2024  ID:  Ariel Henry, DOB 1951-07-24, MRN [id number] PCP:  Ariel Roxan BROCKS, NP  [facility] Providers Cardiologist:  Wendel Haws, MD Referring MD: Ariel Henry, [place]  Chief Complaint/Reason for Referral: Dyspnea ASSESSMENT:    1. Dyspnea, unspecified type   2. History of pulmonary embolus (PE)   3. Primary hypertension   4. Hyperlipidemia LDL goal <70   5. Aortic atherosclerosis (HCC)   6. CKD (chronic kidney disease) stage 2, GFR 60-89 ml/min   7. BMI 50.0-59.9, adult (HCC)     PLAN:   In order of problems listed above: Dyspnea: Patient's dyspnea has improved after she was on Lasix .  She is no longer on Lasix .  Will start Lasix  20 mg as needed and obtain an echocardiogram.  She is not having any anginal symptoms and her dyspnea is not regular or routine.  Will defer coronary CTA for now. History of PE: Continue Xarelto  20 mg  Hypertension: Continue amlodipine 2.5 mg and triamterene/hydrochlorothiazide 37.5 x 25 daily; consider ARB given CKD stage II.   Hyperlipidemia: By virtue of the presence of aortic atherosclerosis we will start atorvastatin  20 mg; check lipid panel, LFTs, LP(a) in 2 months Aortic atherosclerosis: Start atorvastatin  20 mg and continue Xarelto  20 mg CKD stage II: Consider ARB in the future Elevated BMI: Continue diet and exercise modification; hemoglobin A1c consistent with prediabetes            Dispo:  Return in about 3 months (around 11/09/2024).       I spent 40 minutes reviewing all clinical data during and prior to this visit including all relevant imaging studies, laboratories, clinical information from other health systems and prior notes from both Cardiology and other specialties, interviewing the patient, conducting a complete physical examination, and coordinating care in order to formulate a comprehensive and personalized evaluation and treatment plan.   History of Present  Illness:    FOCUSED PROBLEM LIST:   Hypertension Recurrent VTE/PE On indefinite Xarelto  Hyperlipidemia Aortic atherosclerosis CXR 2025 CKD stage II GFR 88 OSH labs February 2025 Reactive airways disease Prediabetes Hemoglobin A1c 6.1 2025 BMI 57  September 2025:  Patient consents to use of AI scribe. The patient is a 73 year old female with above listed medical problems referred for recommendations regarding shortness of breath.  The patient was seen Emergency Department in May of this year.  She presented with chest pain and shortness of breath.  Her BNP and troponins were both normal.  A chest x-ray showed no acute cardiopulmonary disease.  Despite a normal BNP and no evidence of pulmonary edema on a chest x-ray she was given Lasix  and discharged home.  She is here for recommendations regarding ongoing dyspnea.   She was started on triamterene, and she reports that her breathing is better. She has not used her inhaler since July and only uses it when exerting herself. No current chest pain, but she does not like to lay flat in bed. She is unsure about having sleep apnea and denies snoring. She experiences palpitations when rushing, which resolve quickly. Her legs were swollen in May.  She denies snoring but she does not sleep well.  Has never been tested for sleep apnea    Current Medications: Current Meds  Medication Sig   albuterol  (VENTOLIN  HFA) 108 (90 Base) MCG/ACT inhaler Inhale 1-2 puffs into the lungs every 6 (six) hours as needed for wheezing.   amLODipine (NORVASC) 2.5 MG tablet TAKE  1 TABLET BY MOUTH EVERY DAY FOR 30 DAYS   atorvastatin  (LIPITOR) 20 MG tablet Take 1 tablet (20 mg total) by mouth daily.   brimonidine (ALPHAGAN) 0.2 % ophthalmic solution Place 1 drop into the left eye in the morning and at bedtime.   cetirizine  (ZYRTEC ) 10 MG tablet Take 1 tablet (10 mg total) by mouth daily.   cetirizine  HCl (ZYRTEC ) 5 MG/5ML SOLN TAKE 2.5ML BY MOUTH EVERY DAY FOR 30 DAYS    CVS D3 25 MCG (1000 UT) capsule TAKE 1 CAPSULE BY MOUTH EVERY DAY   dextromethorphan-guaiFENesin  (TUSSIN DM) 10-100 MG/5ML liquid Take 10 mLs by mouth every 6 (six) hours as needed for cough.   diphenhydramine-acetaminophen  (TYLENOL  PM) 25-500 MG TABS tablet Take 1 tablet by mouth at bedtime as needed (pain).   dorzolamide -timolol  (COSOPT) 22.3-6.8 MG/ML ophthalmic solution Place 1 drop into the left eye in the morning.   fluticasone (FLONASE) 50 MCG/ACT nasal spray Place 1 spray into both nostrils daily.   furosemide  (LASIX ) 20 MG tablet Take 1 tablet (20 mg total) by mouth as needed.   ipratropium (ATROVENT ) 0.06 % nasal spray Place 2 sprays into both nostrils 3 (three) times daily.   latanoprost  (XALATAN ) 0.005 % ophthalmic solution Place 1 drop into the left eye at bedtime.   Polyethyl Glycol-Propyl Glycol (SYSTANE ULTRA OP) Apply 1 drop to eye daily as needed (dry eyes).   triamterene-hydrochlorothiazide (MAXZIDE-25) 37.5-25 MG tablet Take 1 tablet by mouth daily.   XARELTO  20 MG TABS tablet TAKE 1 TABLET BY MOUTH EVERY DAY IN THE EVENING FOR 90 DAYS     Review of Systems:   Please see the history of present illness.    All other systems reviewed and are negative.     EKGs/Labs/Other Test Reviewed:   EKG: 2025 sinus rhythm with possible LVH  EKG Interpretation Date/Time:    Ventricular Rate:    PR Interval:    QRS Duration:    QT Interval:    QTC Calculation:   R Axis:      Text Interpretation:          CARDIAC STUDIES: Refer to CV Procedures and Imaging Tabs   Risk Assessment/Calculations:          Physical Exam:   VS:  BP 123/80   Pulse 73   Ht 4' 9 (1.448 m)   Wt 274 lb (124.3 kg)   SpO2 98%   BMI 59.29 kg/m        Wt Readings from Last 3 Encounters:  08/10/24 274 lb (124.3 kg)  05/11/24 267 lb 12.8 oz (121.5 kg)  04/22/24 272 lb (123.4 kg)      GENERAL:  No apparent distress, AOx3 HEENT:  No carotid bruits, +2 carotid impulses, no scleral  icterus CAR: RRR  no murmurs, gallops, rubs, or thrills RES:  Clear to auscultation bilaterally ABD:  Soft, nontender, nondistended, positive bowel sounds x 4 VASC:  +2 radial pulses, +2 carotid pulses NEURO:  CN 2-12 grossly intact; motor and sensory grossly intact PSYCH:  No active depression or anxiety EXT:  No edema, ecchymosis, or cyanosis  Signed, Sanaiyah Kirchhoff K Verenice Westrich, MD  08/10/2024 10:55 AM    [facility] [address] [place], [place], [place]  [address] Phone: [phone]; Fax: [phone]   Note:  This document was prepared using Dragon voice recognition software and may include unintentional dictation errors.

## 2024-08-07 ENCOUNTER — Other Ambulatory Visit: Payer: Self-pay | Admitting: Family

## 2024-08-10 ENCOUNTER — Encounter: Payer: Self-pay | Admitting: Internal Medicine

## 2024-08-10 ENCOUNTER — Ambulatory Visit: Attending: Internal Medicine | Admitting: Internal Medicine

## 2024-08-10 VITALS — BP 123/80 | HR 73 | Ht <= 58 in | Wt 274.0 lb

## 2024-08-10 DIAGNOSIS — Z6841 Body Mass Index (BMI) 40.0 and over, adult: Secondary | ICD-10-CM | POA: Diagnosis not present

## 2024-08-10 DIAGNOSIS — I10 Essential (primary) hypertension: Secondary | ICD-10-CM

## 2024-08-10 DIAGNOSIS — E785 Hyperlipidemia, unspecified: Secondary | ICD-10-CM

## 2024-08-10 DIAGNOSIS — Z86711 Personal history of pulmonary embolism: Secondary | ICD-10-CM

## 2024-08-10 DIAGNOSIS — R0600 Dyspnea, unspecified: Secondary | ICD-10-CM | POA: Diagnosis not present

## 2024-08-10 DIAGNOSIS — I700 Atherosclerosis of aorta: Secondary | ICD-10-CM | POA: Diagnosis not present

## 2024-08-10 DIAGNOSIS — N182 Chronic kidney disease, stage 2 (mild): Secondary | ICD-10-CM

## 2024-08-10 MED ORDER — FUROSEMIDE 20 MG PO TABS: 20.0000 mg | ORAL_TABLET | ORAL | 3 refills | Status: DC | PRN

## 2024-08-10 MED ORDER — ATORVASTATIN CALCIUM 20 MG PO TABS: 20.0000 mg | ORAL_TABLET | Freq: Every day | ORAL | 3 refills | Status: DC

## 2024-08-10 NOTE — Patient Instructions (Addendum)
 Medication Instructions:  START Atorvastatin  (Lipitor) 20 mg once daily   AS NEEDED - Furosemide  (Lasix ) 20 mg once daily as needed  *If you need a refill on your cardiac medications before your next appointment, please call your pharmacy*  Lab Work: To be completed in 2 months: lipid panel, LFT, lipoprotein-A (approximately 10/10/24)  If you have labs (blood work) drawn today and your tests are completely normal, you will receive your results only by: MyChart Message (if you have MyChart) OR A paper copy in the mail If you have any lab test that is abnormal or we need to change your treatment, we will call you to review the results.  Testing/Procedures: Your physician has requested that you have an echocardiogram. Echocardiography is a painless test that uses sound waves to create images of your heart. It provides your doctor with information about the size and shape of your heart and how well your heart's chambers and valves are working. This procedure takes approximately one hour. There are no restrictions for this procedure. Please do NOT wear cologne, perfume, aftershave, or lotions (deodorant is allowed). Please arrive 15 minutes prior to your appointment time.  Please note: We ask at that you not bring children with you during ultrasound (echo/ vascular) testing. Due to room size and safety concerns, children are not allowed in the ultrasound rooms during exams. Our front office staff cannot provide observation of children in our lobby area while testing is being conducted. An adult accompanying a patient to their appointment will only be allowed in the ultrasound room at the discretion of the ultrasound technician under special circumstances. We apologize for any inconvenience.   Follow-Up: At [facility], you and your health needs are our priority.  As part of our continuing mission to provide you with exceptional heart care, our providers are all part of one team.  This  team includes your primary Cardiologist (physician) and Advanced Practice Providers or APPs (Physician Assistants and Nurse Practitioners) who all work together to provide you with the care you need, when you need it.  Your next appointment:   3 month(s)  Provider:   Arun Thukkani, MD

## 2024-08-21 ENCOUNTER — Other Ambulatory Visit: Payer: Self-pay | Admitting: Family

## 2024-08-21 DIAGNOSIS — J302 Other seasonal allergic rhinitis: Secondary | ICD-10-CM

## 2024-08-21 NOTE — Telephone Encounter (Signed)
 Medication previously refilled by another PCP Ngetich, Dinah C, NP . Medication pend and sent to provider for approval.

## 2024-09-09 ENCOUNTER — Other Ambulatory Visit: Payer: Self-pay | Admitting: Family

## 2024-09-09 ENCOUNTER — Ambulatory Visit: Admitting: Podiatry

## 2024-09-09 DIAGNOSIS — B351 Tinea unguium: Secondary | ICD-10-CM

## 2024-09-09 DIAGNOSIS — Z79899 Other long term (current) drug therapy: Secondary | ICD-10-CM | POA: Diagnosis not present

## 2024-09-09 DIAGNOSIS — I10 Essential (primary) hypertension: Secondary | ICD-10-CM | POA: Insufficient documentation

## 2024-09-09 DIAGNOSIS — R7301 Impaired fasting glucose: Secondary | ICD-10-CM | POA: Insufficient documentation

## 2024-09-09 NOTE — Telephone Encounter (Unsigned)
 Copied from CRM [id number]. Topic: Clinical - Medication Refill >> Sep 09, 2024  1:38 PM Merlynn A wrote: Medication: XARELTO  20 MG TABS tablet  Has the patient contacted their pharmacy? No (Agent: If no, request that the patient contact the pharmacy for the refill. If patient does not wish to contact the pharmacy document the reason why and proceed with request.) (Agent: If yes, when and what did the pharmacy advise?)  This is the patient's preferred pharmacy:  [id number] [place], [place] - [address] AT [facility] [address] [place] [address] Phone: [phone] Fax: [phone]  Is this the correct pharmacy for this prescription? Yes If no, delete pharmacy and type the correct one.   Has the prescription been filled recently? No  Is the patient out of the medication? No  Has the patient been seen for an appointment in the last year OR does the patient have an upcoming appointment? Yes  Can we respond through MyChart? Yes  Agent: Please be advised that Rx refills may take up to 3 business days. We ask that you follow-up with your pharmacy.

## 2024-09-09 NOTE — Progress Notes (Signed)
 Subjective:  Patient ID: Marciana Trudy Sharps, female    DOB: [birth year],  MRN: [id number]  Chief Complaint  Patient presents with   Nail Problem    Thick discolored nails     73 y.o. female presents with the above complaint.  Patient presents with thick elongated dystrophic mycotic toenails x 10 mild pain on palpation hurts with ambulation or shoe pressure she has tried a medication which has not helped she would like to discuss oral medication.   Review of Systems: Negative except as noted in the HPI. Denies N/V/F/Ch.  Past Medical History:  Diagnosis Date   Arthritis    DVT (deep venous thrombosis) (HCC)    Glaucoma    Hypertension    Neuromuscular disorder (HCC)    carpal tunnel both wrists   Obesity    Pneumonia    Thyroid  disease     Current Outpatient Medications:    albuterol  (VENTOLIN  HFA) 108 (90 Base) MCG/ACT inhaler, Inhale 1-2 puffs into the lungs every 6 (six) hours as needed for wheezing., Disp: 1 each, Rfl: 3   amLODipine (NORVASC) 2.5 MG tablet, TAKE 1 TABLET BY MOUTH EVERY DAY FOR 30 DAYS, Disp: 90 tablet, Rfl: 1   atorvastatin  (LIPITOR) 20 MG tablet, Take 1 tablet (20 mg total) by mouth daily., Disp: 30 tablet, Rfl: 3   brimonidine (ALPHAGAN) 0.2 % ophthalmic solution, Place 1 drop into the left eye in the morning and at bedtime., Disp: , Rfl:    cetirizine  (ZYRTEC ) 10 MG tablet, Take 1 tablet (10 mg total) by mouth daily., Disp: 30 tablet, Rfl: 0   cetirizine  HCl (ZYRTEC ) 5 MG/5ML SOLN, TAKE 2.5ML BY MOUTH EVERY DAY FOR 30 DAYS, Disp: 225 mL, Rfl: 1   CVS D3 25 MCG (1000 UT) capsule, TAKE 1 CAPSULE BY MOUTH EVERY DAY, Disp: 90 capsule, Rfl: 1   dextromethorphan-guaiFENesin  (TUSSIN DM) 10-100 MG/5ML liquid, Take 10 mLs by mouth every 6 (six) hours as needed for cough., Disp: , Rfl:    diphenhydramine-acetaminophen  (TYLENOL  PM) 25-500 MG TABS tablet, Take 1 tablet by mouth at bedtime as needed (pain)., Disp: , Rfl:    dorzolamide -timolol  (COSOPT) 22.3-6.8  MG/ML ophthalmic solution, Place 1 drop into the left eye in the morning., Disp: , Rfl:    fluticasone (FLONASE) 50 MCG/ACT nasal spray, USE 1 SPRAY IN EACH NOSTRIL ONCE A DAY, Disp: 48 mL, Rfl: 1   furosemide  (LASIX ) 20 MG tablet, Take 1 tablet (20 mg total) by mouth as needed., Disp: 30 tablet, Rfl: 3   ipratropium (ATROVENT ) 0.06 % nasal spray, Place 2 sprays into both nostrils 3 (three) times daily., Disp: 15 mL, Rfl: 12   latanoprost  (XALATAN ) 0.005 % ophthalmic solution, Place 1 drop into the left eye at bedtime., Disp: , Rfl:    Polyethyl Glycol-Propyl Glycol (SYSTANE ULTRA OP), Apply 1 drop to eye daily as needed (dry eyes)., Disp: , Rfl:    triamterene-hydrochlorothiazide (MAXZIDE-25) 37.5-25 MG tablet, Take 1 tablet by mouth daily., Disp: , Rfl:    XARELTO  20 MG TABS tablet, TAKE 1 TABLET BY MOUTH EVERY DAY IN THE EVENING FOR 90 DAYS, Disp: 90 tablet, Rfl: 1  Social History   Tobacco Use  Smoking Status Never  Smokeless Tobacco Never    Allergies  Allergen Reactions   Alsoy Soy Formula [Alimentum] Shortness Of Breath   Objective:  There were no vitals filed for this visit. There is no height or weight on file to calculate BMI. Constitutional Well developed. Well nourished.  Vascular  Dorsalis pedis pulses palpable bilaterally. Posterior tibial pulses palpable bilaterally. Capillary refill normal to all digits.  No cyanosis or clubbing noted. Pedal hair growth normal.  Neurologic Normal speech. Oriented to person, place, and time. Epicritic sensation to light touch grossly present bilaterally.  Dermatologic Nails thickened elongated Shogry mycotic toenails x 10 Skin within normal limits  Orthopedic: Normal joint ROM without pain or crepitus bilaterally. No visible deformities. No bony tenderness.   Radiographs: None Assessment:   1. Long-term use of high-risk medication    Plan:  Patient was evaluated and treated and all questions answered.  Onychomycosis  dermatophyte toenails x 10 -Educated the patient on the etiology of onychomycosis and various treatment options associated with improving the fungal load.  I explained to the patient that there is 3 treatment options available to treat the onychomycosis including topical, p.o., laser treatment.  Patient elected to undergo p.o. options with Lamisil/terbinafine therapy.  In order for me to start the medication therapy, I explained to the patient the importance of evaluating the liver and obtaining the liver function test.  Once the liver function test comes back normal I will start him on 52-month course of Lamisil therapy.  Patient understood all risk and would like to proceed with Lamisil therapy.  I have asked the patient to immediately stop the Lamisil therapy if she has any reactions to it and call the office or go to the emergency room right away.  Patient states understanding   No follow-ups on file.

## 2024-09-10 LAB — HEPATIC FUNCTION PANEL
ALT: 6 IU/L (ref 0–32)
AST: 18 IU/L (ref 0–40)
Albumin: 4.1 g/dL (ref 3.9–4.9)
Alkaline Phosphatase: 83 IU/L (ref 49–135)
Bilirubin Total: 0.6 mg/dL (ref 0.0–1.2)
Bilirubin, Direct: 0.25 mg/dL (ref 0.00–0.40)
Total Protein: 7.2 g/dL (ref 6.0–8.5)

## 2024-09-10 MED ORDER — TERBINAFINE HCL 250 MG PO TABS: 250.0000 mg | ORAL_TABLET | Freq: Every day | ORAL | 0 refills | Status: AC

## 2024-09-10 NOTE — Addendum Note (Signed)
 Addended by: Mahnoor Mathisen on: 09/10/2024 07:58 AM   Modules accepted: Orders

## 2024-09-14 ENCOUNTER — Other Ambulatory Visit: Payer: Self-pay | Admitting: Family

## 2024-09-14 DIAGNOSIS — Z1231 Encounter for screening mammogram for malignant neoplasm of breast: Secondary | ICD-10-CM

## 2024-09-16 ENCOUNTER — Ambulatory Visit (HOSPITAL_COMMUNITY)
Admission: RE | Admit: 2024-09-16 | Discharge: 2024-09-16 | Disposition: A | Discharge status: Home / Self Care | Source: Ambulatory Visit | Attending: Cardiology | Admitting: Cardiology

## 2024-09-16 DIAGNOSIS — R0600 Dyspnea, unspecified: Secondary | ICD-10-CM | POA: Insufficient documentation

## 2024-09-16 DIAGNOSIS — I700 Atherosclerosis of aorta: Secondary | ICD-10-CM | POA: Diagnosis present

## 2024-09-16 DIAGNOSIS — I10 Essential (primary) hypertension: Secondary | ICD-10-CM | POA: Insufficient documentation

## 2024-09-16 LAB — ECHOCARDIOGRAM COMPLETE
Area-P 1/2: 1.93 cm2
S' Lateral: 2.3 cm

## 2024-09-17 ENCOUNTER — Ambulatory Visit: Payer: Self-pay | Admitting: Internal Medicine

## 2024-09-17 DIAGNOSIS — I10 Essential (primary) hypertension: Secondary | ICD-10-CM

## 2024-09-17 DIAGNOSIS — E785 Hyperlipidemia, unspecified: Secondary | ICD-10-CM

## 2024-09-17 LAB — HEPATIC FUNCTION PANEL
ALT: 6 IU/L (ref 0–32)
AST: 13 IU/L (ref 0–40)
Albumin: 4.1 g/dL (ref 3.9–4.9)
Alkaline Phosphatase: 86 IU/L (ref 49–135)
Bilirubin Total: 0.6 mg/dL (ref 0.0–1.2)
Bilirubin, Direct: 0.22 mg/dL (ref 0.00–0.40)
Total Protein: 7.5 g/dL (ref 6.0–8.5)

## 2024-09-17 LAB — LIPID PANEL
Chol/HDL Ratio: 2 ratio (ref 0.0–4.4)
Cholesterol, Total: 127 mg/dL (ref 100–199)
HDL: 64 mg/dL (ref >39)
LDL Chol Calc (NIH): 50 mg/dL (ref 0–99)
Triglycerides: 62 mg/dL (ref 0–149)
VLDL Cholesterol Cal: 13 mg/dL (ref 5–40)

## 2024-09-17 LAB — LIPOPROTEIN A (LPA): Lipoprotein (a): 157.7 nmol/L — ABNORMAL HIGH (ref <75.0)

## 2024-09-18 MED ORDER — LOSARTAN POTASSIUM 25 MG PO TABS: 12.5000 mg | ORAL_TABLET | Freq: Every day | ORAL | 3 refills | Status: DC

## 2024-10-02 LAB — BASIC METABOLIC PANEL WITH GFR
BUN/Creatinine Ratio: 13 (ref 12–28)
BUN: 14 mg/dL (ref 8–27)
CO2: 27 mmol/L (ref 20–29)
Calcium: 9.6 mg/dL (ref 8.7–10.3)
Chloride: 99 mmol/L (ref 96–106)
Creatinine, Ser: 1.05 mg/dL — ABNORMAL HIGH (ref 0.57–1.00)
Glucose: 88 mg/dL (ref 70–99)
Potassium: 4.4 mmol/L (ref 3.5–5.2)
Sodium: 139 mmol/L (ref 134–144)
eGFR: 57 mL/min/1.73 — ABNORMAL LOW (ref >59)

## 2024-11-04 ENCOUNTER — Encounter: Payer: Self-pay | Admitting: *Deleted

## 2024-11-05 ENCOUNTER — Other Ambulatory Visit: Payer: Self-pay | Admitting: Internal Medicine

## 2024-11-09 ENCOUNTER — Ambulatory Visit: Admitting: Family

## 2024-11-11 ENCOUNTER — Encounter: Payer: Self-pay | Admitting: Family

## 2024-11-11 ENCOUNTER — Ambulatory Visit: Payer: Self-pay | Admitting: Family

## 2024-11-11 ENCOUNTER — Ambulatory Visit: Admitting: Family

## 2024-11-11 VITALS — BP 130/84 | HR 62 | Temp 97.7°F | Ht <= 58 in | Wt 282.4 lb

## 2024-11-11 DIAGNOSIS — R7303 Prediabetes: Secondary | ICD-10-CM | POA: Diagnosis not present

## 2024-11-11 DIAGNOSIS — E039 Hypothyroidism, unspecified: Secondary | ICD-10-CM | POA: Diagnosis not present

## 2024-11-11 DIAGNOSIS — K5901 Slow transit constipation: Secondary | ICD-10-CM | POA: Diagnosis not present

## 2024-11-11 DIAGNOSIS — Z86711 Personal history of pulmonary embolism: Secondary | ICD-10-CM | POA: Diagnosis not present

## 2024-11-11 DIAGNOSIS — J4520 Mild intermittent asthma, uncomplicated: Secondary | ICD-10-CM

## 2024-11-11 DIAGNOSIS — I10 Essential (primary) hypertension: Secondary | ICD-10-CM | POA: Diagnosis not present

## 2024-11-11 MED ORDER — SENNOSIDES 8.6 MG PO TABS: 1.0000 | ORAL_TABLET | Freq: Every day | ORAL | 3 refills | Status: AC

## 2024-11-11 NOTE — Progress Notes (Signed)
 "  Provider: Roxan Plough FNP-C   Ariel Henry, Ariel BROCKS, NP  Patient Care Team: Ariel Henry, Ariel BROCKS, NP as PCP - General (Family Medicine)  Extended Emergency Contact Information Primary Emergency Contact: Ariel Henry Address: [address]          [place], [place] [address] [place] Home Phone: [phone] Work Phone: [phone] Mobile Phone: [phone] Relation: Son Secondary Emergency Contact: Ariel Henry Address: [address]          [place], [place] [address] [place] Home Phone: [phone] Mobile Phone: [phone] Relation: Son  Code Status:  Full Code  Goals of care: Advanced Directive information    05/11/2024   12:42 PM  Advanced Directives  Does Patient Have a Medical Advance Directive? Yes  Type of Advance Directive Living will  Does patient want to make changes to medical advance directive? No - Patient declined     Chief Complaint  Patient presents with   Medical Management of Chronic Issues    6 month follow up    HPI:  Pt is a 73 y.o. female seen today for 6 months for medical management of chronic diseases.    Hypertension - SB/p reading at home runs 110's -120's /70's.On amlodipine and Losartan .she denies any headache,dizziness,vision changes,fatigue,chest tightness,palpitation,chest pain or shortness of breath.   Asthma - on albuterol  has been using up to three  times a day. No wheezing.  Hyperlipidemia - on atorvastatin  20 denies any muscle weakness or aches.cooks own food or son brings a plate.  Does not exercise.  Hx of pulmonary Embolism - on Xarelto  20 mg tablet. No blood in the stool or dark stool.   Constipation - stopped taking iron tablet.eats her veggies. Request stool softeners.   Past Medical History:  Diagnosis Date   Arthritis    DVT (deep venous thrombosis) (HCC)    Glaucoma    Hypertension    Neuromuscular disorder (HCC)    carpal tunnel both wrists   Obesity    Pneumonia    Thyroid  disease     Past Surgical History:  Procedure Laterality Date   ABDOMINAL HYSTERECTOMY     bladder tack     COLONOSCOPY WITH PROPOFOL  N/A 09/04/2016   Procedure: COLONOSCOPY WITH PROPOFOL ;  Surgeon: Gustav LULLA Mcgee, MD;  Location: [facility];  Service: Endoscopy;  Laterality: N/A;   EYE SURGERY     bilateral cataract extraction    Allergies[1]  Allergies as of 11/11/2024       Reactions   Alsoy Soy Formula [alimentum] Shortness Of Breath        Medication List        Accurate as of November 11, 2024  2:14 PM. If you have any questions, ask your nurse or doctor.          albuterol  108 (90 Base) MCG/ACT inhaler Commonly known as: VENTOLIN  HFA INHALE 1 TO 2 PUFFS BY MOUTH EVERY 6 HOURS AS NEEDED FOR WHEEZE   amLODipine 2.5 MG tablet Commonly known as: NORVASC TAKE 1 TABLET BY MOUTH EVERY DAY FOR 30 DAYS   atorvastatin  20 MG tablet Commonly known as: LIPITOR TAKE 1 TABLET BY MOUTH EVERY DAY   brimonidine 0.2 % ophthalmic solution Commonly known as: ALPHAGAN Place 1 drop into the left eye in the morning and at bedtime.   cetirizine  10 MG tablet Commonly known as: ZYRTEC  Take 1 tablet (10 mg total) by mouth daily.   cetirizine  HCl 5 MG/5ML Soln Commonly known as: Zyrtec  TAKE 2.5ML  BY MOUTH EVERY DAY FOR 30 DAYS   CVS D3 25 MCG (1000 UT) capsule Generic drug: Cholecalciferol TAKE 1 CAPSULE BY MOUTH EVERY DAY   diphenhydramine-acetaminophen  25-500 MG Tabs tablet Commonly known as: TYLENOL  PM Take 1 tablet by mouth at bedtime as needed (pain).   dorzolamide -timolol  2-0.5 % ophthalmic solution Commonly known as: COSOPT Place 1 drop into the left eye in the morning.   fluticasone 50 MCG/ACT nasal spray Commonly known as: FLONASE USE 1 SPRAY IN EACH NOSTRIL ONCE A DAY   furosemide  20 MG tablet Commonly known as: LASIX  TAKE 1 TABLET BY MOUTH AS NEEDED   ipratropium 0.06 % nasal spray Commonly known as: ATROVENT  Place 2 sprays into both nostrils 3 (three)  times daily.   latanoprost  0.005 % ophthalmic solution Commonly known as: XALATAN  Place 1 drop into the left eye at bedtime.   losartan  25 MG tablet Commonly known as: COZAAR  Take 0.5 tablets (12.5 mg total) by mouth daily.   SYSTANE ULTRA OP Apply 1 drop to eye daily as needed (dry eyes).   terbinafine  250 MG tablet Commonly known as: LAMISIL  Take 1 tablet (250 mg total) by mouth daily.   triamterene-hydrochlorothiazide 37.5-25 MG tablet Commonly known as: MAXZIDE-25 Take 1 tablet by mouth daily.   Tussin DM 10-100 MG/5ML liquid Generic drug: dextromethorphan-guaiFENesin  Take 10 mLs by mouth every 6 (six) hours as needed for cough.   Xarelto  20 MG Tabs tablet Generic drug: rivaroxaban  TAKE 1 TABLET BY MOUTH EVERY DAY IN THE EVENING FOR 90 DAYS        Review of Systems  Constitutional:  Negative for appetite change, chills, fatigue, fever and unexpected weight change.  HENT:  Negative for congestion, dental problem, ear discharge, ear pain, facial swelling, hearing loss, nosebleeds, postnasal drip, rhinorrhea, sinus pressure, sinus pain, sneezing, sore throat, tinnitus and trouble swallowing.   Eyes:  Negative for pain, discharge, redness, itching and visual disturbance.  Respiratory:  Negative for cough, chest tightness, shortness of breath and wheezing.   Cardiovascular:  Negative for chest pain, palpitations and leg swelling.  Gastrointestinal:  Positive for constipation. Negative for abdominal distention, abdominal pain, blood in stool, diarrhea, nausea and vomiting.  Endocrine: Negative for cold intolerance, heat intolerance, polydipsia, polyphagia and polyuria.  Genitourinary:  Negative for difficulty urinating, dysuria, flank pain, frequency and urgency.  Musculoskeletal:  Positive for gait problem. Negative for arthralgias, back pain, joint swelling, myalgias, neck pain and neck stiffness.  Skin:  Negative for color change, pallor, rash and wound.  Neurological:   Negative for dizziness, syncope, speech difficulty, weakness, light-headedness, numbness and headaches.  Hematological:  Does not bruise/bleed easily.  Psychiatric/Behavioral:  Negative for agitation, behavioral problems, confusion, hallucinations, self-injury, sleep disturbance and suicidal ideas. The patient is not nervous/anxious.     Immunization History  Administered Date(s) Administered   INFLUENZA, HIGH DOSE SEASONAL PF 07/19/2019   Influenza,inj,Quad PF,6+ Mos 07/02/2017, 07/30/2018   Influenza-Unspecified 09/11/2023   Pneumococcal Conjugate-13 07/19/2019   Pneumococcal Polysaccharide-23 08/09/2020   Unspecified SARS-COV-2 Vaccination 08/30/2022, 09/11/2023   Zoster Recombinant(Shingrix) 01/27/2024   Pertinent  Health Maintenance Due  Topic Date Due   Bone Density Scan  Never done   Influenza Vaccine  06/26/2024   Mammogram  07/20/2026   Colonoscopy  09/04/2026      09/18/2019    1:41 PM 09/28/2019    7:56 PM 09/18/2021    1:14 PM 06/08/2022   11:27 AM 05/11/2024   12:41 PM  Fall Risk  Falls in the  past year?     0  Was there an injury with Fall?     0   Fall Risk Category Calculator     0  (RETIRED) Patient Fall Risk Level Low fall risk  Low fall risk  Low fall risk  Low fall risk    Patient at Risk for Falls Due to     No Fall Risks  Fall risk Follow up     Falls evaluation completed     Data saved with a previous flowsheet row definition   Functional Status Survey:    Vitals:   11/11/24 1401  BP: 130/84  Pulse: 62  Temp: 97.7 F (36.5 C)  TempSrc: Temporal  SpO2: 95%  Weight: 282 lb 6.4 oz (128.1 kg)  Height: 4' 9 (1.448 m)   Body mass index is 61.11 kg/m. Physical Exam Vitals reviewed.  Constitutional:      General: She is not in acute distress.    Appearance: Normal appearance. She is normal weight. She is not ill-appearing or diaphoretic.  HENT:     Head: Normocephalic.     Right Ear: Tympanic membrane, ear canal and external ear normal. There  is no impacted cerumen.     Left Ear: Tympanic membrane, ear canal and external ear normal. There is no impacted cerumen.     Nose: Nose normal. No congestion or rhinorrhea.     Mouth/Throat:     Mouth: Mucous membranes are moist.     Pharynx: Oropharynx is clear. No oropharyngeal exudate or posterior oropharyngeal erythema.  Eyes:     General: No scleral icterus.       Right eye: No discharge.        Left eye: No discharge.     Extraocular Movements: Extraocular movements intact.     Conjunctiva/sclera: Conjunctivae normal.     Pupils: Pupils are equal, round, and reactive to light.  Neck:     Vascular: No carotid bruit.  Cardiovascular:     Rate and Rhythm: Normal rate and regular rhythm.     Pulses: Normal pulses.     Heart sounds: Normal heart sounds. No murmur heard.    No friction rub. No gallop.  Pulmonary:     Effort: Pulmonary effort is normal. No respiratory distress.     Breath sounds: Normal breath sounds. No wheezing, rhonchi or rales.  Chest:     Chest wall: No tenderness.  Abdominal:     General: Bowel sounds are normal. There is no distension.     Palpations: Abdomen is soft. There is no mass.     Tenderness: There is no abdominal tenderness. There is no right CVA tenderness, left CVA tenderness, guarding or rebound.  Musculoskeletal:        General: No swelling or tenderness. Normal range of motion.     Cervical back: Normal range of motion. No rigidity or tenderness.     Right lower leg: No edema.     Left lower leg: No edema.  Lymphadenopathy:     Cervical: No cervical adenopathy.  Skin:    General: Skin is warm and dry.     Coloration: Skin is not pale.     Findings: No bruising, erythema, lesion or rash.  Neurological:     Mental Status: She is alert and oriented to person, place, and time.     Cranial Nerves: No cranial nerve deficit.     Sensory: No sensory deficit.     Motor: No weakness.  Coordination: Coordination normal.     Gait: Gait  abnormal.  Psychiatric:        Mood and Affect: Mood normal.        Speech: Speech normal.        Behavior: Behavior normal.        Thought Content: Thought content normal.        Judgment: Judgment normal.    Physical Exam          Labs reviewed: Recent Labs    02/03/24 1622 04/22/24 2221 05/18/24 0850 10/01/24 1132  NA 139 139 139 139  K 3.9 3.9 4.0 4.4  CL 105 105 105 99  CO2 28 25 26 27   GLUCOSE 87 102* 104* 88  BUN 12 16 10 14   CREATININE 0.61 1.01* 0.80 1.05*  CALCIUM  8.9 9.4 9.2 9.6  MG 1.9  --   --   --    Recent Labs    02/03/24 1622 05/18/24 0850 09/09/24 0949 09/16/24 0917  AST 16 11 18 13   ALT 8 5* 6 6  ALKPHOS 62  --  83 86  BILITOT 0.8 0.6 0.6 0.6  PROT 7.7 6.7 7.2 7.5  ALBUMIN 3.8  --  4.1 4.1   Recent Labs    04/22/24 2221 05/18/24 0850 06/15/24 1354  WBC 5.1 4.2 4.2  NEUTROABS  --  2,869 2,785  HGB 10.6* 10.3* 11.5*  HCT 35.2* 36.1 40.7  MCV 78.2* 77.1* 76.6*  PLT 385 334 353   Lab Results  Component Value Date   TSH 0.898 09/18/2019   Lab Results  Component Value Date   HGBA1C 5.8 (H) 12/14/2016   Lab Results  Component Value Date   CHOL 127 09/16/2024   HDL 64 09/16/2024   LDLCALC 50 09/16/2024   TRIG 62 09/16/2024   CHOLHDL 2.0 09/16/2024    Significant Diagnostic Results in last 30 days:  No results found.  Assessment/Plan 1. Primary hypertension (Primary) - Blood pressure stable - Continue on amlodipine, losartan  and triamterene-hydrochlorothiazide - Dietary modification and exercise advised  2. Acquired hypothyroidism Lab Results  Component Value Date   TSH 0.898 09/18/2019  Not on any medication -Will continue to monitor TSH level  3. Prediabetes Lab Results  Component Value Date   HGBA1C 5.8 (H) 12/14/2016  - Dietary modification advised and exercise  4. Mild intermittent asthma, uncomplicated - Controlled - Continue on albuterol  and Atrovent    5. History of pulmonary embolism Continue on  Xarelto  - Continue to monitor for signs of bleeding  6. Slow transit constipation Encouraged to increase fiber in the diet -Encourage hydration -Start on senna as below.  May hold for loose stool - senna (SENOKOT) 8.6 MG tablet; Take 1 tablet (8.6 mg total) by mouth daily.  Dispense: 30 tablet; Refill: 3  Family/ staff Communication: Reviewed plan of care with patient verbalized understanding  Labs/tests ordered: None   Next Appointment : Return in about 6 months (around 05/12/2025) for medical mangement of chronic issues.Ariel Henry  Spent 30 minutes of Face to face and non-face to face with patient  >50% time spent counseling; reviewing medical record; tests; labs; documentation and developing future plan of care.   Ariel JAYSON Plough, NP       [1]  Allergies Allergen Reactions   Alsoy Soy Formula [Alimentum] Shortness Of Breath   "

## 2024-11-12 ENCOUNTER — Ambulatory Visit: Admitting: Family

## 2024-11-24 ENCOUNTER — Telehealth: Payer: Self-pay | Admitting: Family

## 2024-11-24 NOTE — Telephone Encounter (Signed)
 Pt has an appt on 12/24/2024 at 8:00 AM [facility] Department: [facility]  Copied from CRM #[id number]. Topic: Referral - Question >> Aug 03, 2024  8:15 AM Ariel Henry wrote: Reason for CRM: Patient states the place where she was referred to for her bone density test called her and stated that they do not do it there. Patient will need Henry referral somewhere else. Patient states that she wants to make sure it is in her network, she has [employer]. Patient would like Henry call back.  Patient states if you cannot get her on her mobile to call her house phone [phone].

## 2024-11-26 NOTE — Progress Notes (Signed)
 "  Cardiology Office Note:   Date:  12/04/2024  ID:  Ariel Henry, DOB May 03, 1951, MRN [id number] PCP:  Leonarda Roxan BROCKS, NP  [facility] Providers Cardiologist:  Wendel Haws, MD Referring MD: Ngetich, Dinah C, NP  Chief Complaint/Reason for Referral: Dyspnea ASSESSMENT:    1. Chronic diastolic heart failure (HCC)   2. History of pulmonary embolus (PE)   3. Primary hypertension   4. Elevated lipoprotein(a)   5. Aortic atherosclerosis   6. CKD (chronic kidney disease) stage 2, GFR 60-89 ml/min   7. Prediabetes   8. BMI 50.0-59.9, adult (HCC)   9. Essential hypertension      PLAN:   In order of problems listed above: Chronic diastolic heart failure: Increase losartan  25 mg daily; patient is unsure about Jardiance.  Will defer for now and discuss at a later time.  Consider Toprol at a later time.  Change Lasix  20 mg as needed to Lasix  20 mg daily. Check BMP in one week. History of PE: Continue Xarelto  20 mg  Hypertension: Discontinue triamterene hydrochlorothiazide; increase losartan  to 25 mg.  Continue amlodipine 2.5 mg Hyperlipidemia: Continue atorvastatin  20 mg; LDL recently was 50. Aortic atherosclerosis: Continue atorvastatin  20 mg and continue Xarelto  20 mg CKD stage II: Increase losartan  as above; consider Jardiance 10 mg daily in future Prediabetes: Continue to aggressively control cardiovascular risk factors Elevated BMI: Continue diet and exercise modification; hemoglobin A1c consistent with prediabetes            Dispo:  Return in about 6 months (around 06/03/2025).       I spent 35 minutes reviewing all clinical data during and prior to this visit including all relevant imaging studies, laboratories, clinical information from other health systems and prior notes from both Cardiology and other specialties, interviewing the patient, conducting a complete physical examination, and coordinating care in order to formulate a comprehensive and personalized  evaluation and treatment plan.   History of Present Illness:    FOCUSED PROBLEM LIST:   Chronic diastolic heart failure Moderate LVH, G1 DD, no significant valvular issues, EF 60-65% TTE October 2025 Recurrent VTE/PE On indefinite Xarelto  Hypertension Hyperlipidemia LP(a) 157 Aortic atherosclerosis CXR 2025 CKD stage II GFR 88 OSH labs February 2025 Reactive airways disease Prediabetes Hemoglobin A1c 6.1 2025 BMI 57  September 2025:  Patient consents to use of AI scribe. The patient is a 74 year old female with above listed medical problems referred for recommendations regarding shortness of breath.  The patient was seen Emergency Department in May of this year.  She presented with chest pain and shortness of breath.  Her BNP and troponins were both normal.  A chest x-ray showed no acute cardiopulmonary disease.  Despite a normal BNP and no evidence of pulmonary edema on a chest x-ray she was given Lasix  and discharged home.  She is here for recommendations regarding ongoing dyspnea.  She was started on triamterene, and she reports that her breathing is better. She has not used her inhaler since July and only uses it when exerting herself. No current chest pain, but she does not like to lay flat in bed. She is unsure about having sleep apnea and denies snoring. She experiences palpitations when rushing, which resolve quickly. Her legs were swollen in May.  She denies snoring but she does not sleep well.  Has never been tested for sleep apnea.  Plan: Start Lasix  20 mg, start atorvastatin  20 mg, check lipid panel, LFTs, LP(a) in 2 months.  Obtain echocardiogram  January 2026:  Patient consents to use of AI scribe. In the interim the patient's LP(a) was elevated.  Her lipid panel was at goal with an LDL of 50 however.  Her echocardiogram demonstrated grade 1 diastolic dysfunction.  Started on losartan  12.5 mg.  She experiences shortness of breath primarily during physical activity or when in  a hurry. No episodes of syncope or orthopnea, although she prefers to sleep propped up with a pillow due to breathing difficulties.  She is on chronic anticoagulation therapy with blood thinners due to a history of multiple blood clots. No bleeding complications while on these medications.  She was previously started on losartan  after an echocardiogram was performed. She currently takes a half pill of losartan .  She has not yet started Lasix  for fluid management.     Current Medications: Current Meds  Medication Sig   albuterol  (VENTOLIN  HFA) 108 (90 Base) MCG/ACT inhaler INHALE 1 TO 2 PUFFS BY MOUTH EVERY 6 HOURS AS NEEDED FOR WHEEZE   amLODipine (NORVASC) 2.5 MG tablet TAKE 1 TABLET BY MOUTH EVERY DAY FOR 30 DAYS   atorvastatin  (LIPITOR) 20 MG tablet TAKE 1 TABLET BY MOUTH EVERY DAY   brimonidine (ALPHAGAN) 0.2 % ophthalmic solution Place 1 drop into the left eye in the morning and at bedtime.   cetirizine  (ZYRTEC ) 10 MG tablet Take 1 tablet (10 mg total) by mouth daily.   cetirizine  HCl (ZYRTEC ) 5 MG/5ML SOLN TAKE 2.5ML BY MOUTH EVERY DAY FOR 30 DAYS   [facility] D3 25 MCG (1000 UT) capsule TAKE 1 CAPSULE BY MOUTH EVERY DAY   dorzolamide -timolol  (COSOPT) 22.3-6.8 MG/ML ophthalmic solution Place 1 drop into the left eye in the morning.   fluticasone (FLONASE) 50 MCG/ACT nasal spray USE 1 SPRAY IN EACH NOSTRIL ONCE A DAY   ipratropium (ATROVENT ) 0.06 % nasal spray Place 2 sprays into both nostrils 3 (three) times daily. (Patient taking differently: Place 2 sprays into both nostrils 3 (three) times daily as needed.)   latanoprost  (XALATAN ) 0.005 % ophthalmic solution Place 1 drop into the left eye at bedtime.   Polyethyl Glycol-Propyl Glycol (SYSTANE ULTRA OP) Apply 1 drop to eye daily as needed (dry eyes).   senna (SENOKOT) 8.6 MG tablet Take 1 tablet (8.6 mg total) by mouth daily.   terbinafine  (LAMISIL ) 250 MG tablet Take 1 tablet (250 mg total) by mouth daily.   XARELTO  20 MG TABS tablet TAKE 1  TABLET BY MOUTH EVERY DAY IN THE EVENING FOR 90 DAYS   [DISCONTINUED] furosemide  (LASIX ) 20 MG tablet TAKE 1 TABLET BY MOUTH AS NEEDED   [DISCONTINUED] losartan  (COZAAR ) 25 MG tablet Take 0.5 tablets (12.5 mg total) by mouth daily.   [DISCONTINUED] triamterene-hydrochlorothiazide (MAXZIDE-25) 37.5-25 MG tablet Take 1 tablet by mouth daily.     Review of Systems:   Please see the history of present illness.    All other systems reviewed and are negative.     EKGs/Labs/Other Test Reviewed:   EKG: 2025 sinus rhythm with possible LVH  EKG Interpretation Date/Time:    Ventricular Rate:    PR Interval:    QRS Duration:    QT Interval:    QTC Calculation:   R Axis:      Text Interpretation:          CARDIAC STUDIES: Refer to CV Procedures and Imaging Tabs   Risk Assessment/Calculations:          Physical Exam:   VS:  BP 138/78   Pulse 75  Ht 4' 9 (1.448 m)   Wt 279 lb 6.4 oz (126.7 kg)   SpO2 95%   BMI 60.46 kg/m        Wt Readings from Last 3 Encounters:  12/04/24 279 lb 6.4 oz (126.7 kg)  11/11/24 282 lb 6.4 oz (128.1 kg)  08/10/24 274 lb (124.3 kg)      GENERAL:  No apparent distress, AOx3 HEENT:  No carotid bruits, +2 carotid impulses, no scleral icterus CAR: RRR  no murmurs, gallops, rubs, or thrills RES:  Clear to auscultation bilaterally ABD:  Soft, nontender, nondistended, positive bowel sounds x 4 VASC:  +2 radial pulses, +2 carotid pulses NEURO:  CN 2-12 grossly intact; motor and sensory grossly intact PSYCH:  No active depression or anxiety EXT:  No edema, ecchymosis, or cyanosis  Signed, Donalyn Schneeberger K Jess Sulak, MD  12/04/2024 8:46 AM    [facility] [address] [place], [place], [place]  [address] Phone: [phone]; Fax: [phone]   Note:  This document was prepared using Dragon voice recognition software and may include unintentional dictation errors. "

## 2024-12-04 ENCOUNTER — Ambulatory Visit: Attending: Internal Medicine | Admitting: Internal Medicine

## 2024-12-04 ENCOUNTER — Encounter: Payer: Self-pay | Admitting: Internal Medicine

## 2024-12-04 VITALS — BP 138/78 | HR 75 | Ht <= 58 in | Wt 279.4 lb

## 2024-12-04 DIAGNOSIS — I10 Essential (primary) hypertension: Secondary | ICD-10-CM | POA: Diagnosis not present

## 2024-12-04 DIAGNOSIS — Z86711 Personal history of pulmonary embolism: Secondary | ICD-10-CM | POA: Diagnosis not present

## 2024-12-04 DIAGNOSIS — I5032 Chronic diastolic (congestive) heart failure: Secondary | ICD-10-CM

## 2024-12-04 DIAGNOSIS — N182 Chronic kidney disease, stage 2 (mild): Secondary | ICD-10-CM

## 2024-12-04 DIAGNOSIS — Z6841 Body Mass Index (BMI) 40.0 and over, adult: Secondary | ICD-10-CM | POA: Diagnosis not present

## 2024-12-04 DIAGNOSIS — R7303 Prediabetes: Secondary | ICD-10-CM

## 2024-12-04 DIAGNOSIS — I700 Atherosclerosis of aorta: Secondary | ICD-10-CM

## 2024-12-04 DIAGNOSIS — E7841 Elevated Lipoprotein(a): Secondary | ICD-10-CM | POA: Diagnosis not present

## 2024-12-04 MED ORDER — LOSARTAN POTASSIUM 25 MG PO TABS: 25.0000 mg | ORAL_TABLET | Freq: Every day | ORAL | 3 refills | Status: AC

## 2024-12-04 MED ORDER — FUROSEMIDE 20 MG PO TABS: 20.0000 mg | ORAL_TABLET | Freq: Every day | ORAL | 3 refills | Status: AC

## 2024-12-04 NOTE — Patient Instructions (Addendum)
 Medication Instructions:  STOP Triamterene-hydrochlorothiazide (Maxzide)  START Furosemide  (Lasix ) 20 mg once daily   INCREASE Losartan  to 25 mg once daily  *If you need a refill on your cardiac medications before your next appointment, please call your pharmacy*  Lab Work: To be completed in 1 week: BMP (around 12/11/24)  If you have labs (blood work) drawn today and your tests are completely normal, you will receive your results only by: MyChart Message (if you have MyChart) OR A paper copy in the mail If you have any lab test that is abnormal or we need to change your treatment, we will call you to review the results.  Testing/Procedures: None ordered today.  Follow-Up: At [facility], you and your health needs are our priority.  As part of our continuing mission to provide you with exceptional heart care, our providers are all part of one team.  This team includes your primary Cardiologist (physician) and Advanced Practice Providers or APPs (Physician Assistants and Nurse Practitioners) who all work together to provide you with the care you need, when you need it.  Your next appointment:   6 month(s)  Provider:   Glendia Ferrier, PA-C

## 2024-12-10 ENCOUNTER — Other Ambulatory Visit: Payer: Self-pay | Admitting: Adult Health

## 2024-12-10 DIAGNOSIS — Z86711 Personal history of pulmonary embolism: Secondary | ICD-10-CM

## 2024-12-10 MED ORDER — RIVAROXABAN 20 MG PO TABS: 20.0000 mg | ORAL_TABLET | Freq: Every day | ORAL | 1 refills | Status: AC

## 2024-12-10 NOTE — Telephone Encounter (Signed)
 Copied from CRM [id number]. Topic: Clinical - Medication Refill >> Dec 10, 2024  2:00 PM Cherylann S wrote: Medication: XARELTO  20 MG TABS tablet  Has the patient contacted their pharmacy?  (Agent: If no, request that the patient contact the pYesharmacy for the refill. If patient does not wish to contact the pharmacy document the reason why and proceed with request.) (Agent: If yes, when and what did the pharmacy advise?)  This is the patient's preferred pharmacy:  [id number] [place], [place] - [address] AT [facility] [address] [place] [address] Phone: [phone] Fax: [phone]  Is this the correct pharmacy for this prescription? Yes If no, delete pharmacy and type the correct one.   Has the prescription been filled recently? No  Is the patient out of the medication? No 4 pills remaining  Has the patient been seen for an appointment in the last year OR does the patient have an upcoming appointment? Yes  Can we respond through MyChart? Yes  Agent: Please be advised that Rx refills may take up to 3 business days. We ask that you follow-up with your pharmacy.

## 2024-12-17 ENCOUNTER — Telehealth: Payer: Self-pay | Admitting: Internal Medicine

## 2024-12-17 LAB — BASIC METABOLIC PANEL WITH GFR
BUN/Creatinine Ratio: 20 (ref 12–28)
BUN: 15 mg/dL (ref 8–27)
CO2: 25 mmol/L (ref 20–29)
Calcium: 9.3 mg/dL (ref 8.7–10.3)
Chloride: 103 mmol/L (ref 96–106)
Creatinine, Ser: 0.75 mg/dL (ref 0.57–1.00)
Glucose: 90 mg/dL (ref 70–99)
Potassium: 4.1 mmol/L (ref 3.5–5.2)
Sodium: 142 mmol/L (ref 134–144)
eGFR: 85 mL/min/1.73

## 2024-12-17 NOTE — Telephone Encounter (Signed)
 Recent med change on 12/04/24 was increase Losartan  to 25 mg daily and stop Maxzide and begin lasix  20 mg daily.   Noted yesterday for the first time she felt dizzy, reports BP was 131/77. Symptoms lasted momentarily.    Today she has not had any symptoms of dizziness but held her losartan . Her BP now is 155/88, HR 77   I advised her to eat breakfast and take her losartan  as directed and monitor BP 2 hours after taking meds and to call back id dizziness returns. She is agreeable with plan.

## 2024-12-17 NOTE — Telephone Encounter (Signed)
 Pt c/o medication issue:  1. Name of Medication: pt not sure   2. How are you currently taking this medication (dosage and times per day)? As advised   3. Are you having a reaction (difficulty breathing--STAT)? Dizziness   4. What is your medication issue? Pt states she thinks she needs to cut back on her medication due to severe dizziness. She did not take medicine today and feels a little better.  Please advise.

## 2024-12-18 ENCOUNTER — Ambulatory Visit: Payer: Self-pay | Admitting: Internal Medicine

## 2024-12-23 ENCOUNTER — Ambulatory Visit (HOSPITAL_BASED_OUTPATIENT_CLINIC_OR_DEPARTMENT_OTHER)

## 2024-12-24 ENCOUNTER — Ambulatory Visit (HOSPITAL_BASED_OUTPATIENT_CLINIC_OR_DEPARTMENT_OTHER)
Admission: RE | Admit: 2024-12-24 | Discharge: 2024-12-24 | Disposition: A | Discharge status: Home / Self Care | Source: Ambulatory Visit | Attending: Family | Admitting: Family

## 2024-12-24 DIAGNOSIS — E2839 Other primary ovarian failure: Secondary | ICD-10-CM | POA: Insufficient documentation

## 2024-12-30 ENCOUNTER — Other Ambulatory Visit: Payer: Self-pay | Admitting: Family

## 2024-12-30 DIAGNOSIS — J4520 Mild intermittent asthma, uncomplicated: Secondary | ICD-10-CM

## 2024-12-30 NOTE — Telephone Encounter (Signed)
 High risk warning populated when attempting to refill medications, will send to provider for pending  review and approval

## 2025-05-19 ENCOUNTER — Ambulatory Visit: Admitting: Family

## 2025-07-23 ENCOUNTER — Ambulatory Visit
